# Patient Record
Sex: Male | Born: 1950 | Race: White | Hispanic: No | Marital: Married | Smoking: Never smoker
Health system: Southern US, Community
[De-identification: ages and names within clinical notes are randomized; demographics above are authoritative.]

## PROBLEM LIST (undated history)

## (undated) DIAGNOSIS — K219 Gastro-esophageal reflux disease without esophagitis: Secondary | ICD-10-CM

## (undated) DIAGNOSIS — C4490 Unspecified malignant neoplasm of skin, unspecified: Secondary | ICD-10-CM

## (undated) DIAGNOSIS — M51369 Other intervertebral disc degeneration, lumbar region without mention of lumbar back pain or lower extremity pain: Secondary | ICD-10-CM

## (undated) DIAGNOSIS — H3530 Unspecified macular degeneration: Secondary | ICD-10-CM

## (undated) DIAGNOSIS — N200 Calculus of kidney: Secondary | ICD-10-CM

## (undated) DIAGNOSIS — B360 Pityriasis versicolor: Secondary | ICD-10-CM

## (undated) DIAGNOSIS — M419 Scoliosis, unspecified: Secondary | ICD-10-CM

## (undated) DIAGNOSIS — M5136 Other intervertebral disc degeneration, lumbar region: Secondary | ICD-10-CM

## (undated) DIAGNOSIS — I442 Atrioventricular block, complete: Secondary | ICD-10-CM

## (undated) DIAGNOSIS — I10 Essential (primary) hypertension: Secondary | ICD-10-CM

## (undated) DIAGNOSIS — Z8619 Personal history of other infectious and parasitic diseases: Secondary | ICD-10-CM

## (undated) HISTORY — PX: COLONOSCOPY: SHX174

## (undated) HISTORY — DX: Scoliosis, unspecified: M41.9

## (undated) HISTORY — DX: Essential (primary) hypertension: I10

## (undated) HISTORY — DX: Unspecified macular degeneration: H35.30

## (undated) HISTORY — DX: Other intervertebral disc degeneration, lumbar region: M51.36

## (undated) HISTORY — DX: Other intervertebral disc degeneration, lumbar region without mention of lumbar back pain or lower extremity pain: M51.369

## (undated) HISTORY — DX: Calculus of kidney: N20.0

## (undated) HISTORY — DX: Pityriasis versicolor: B36.0

## (undated) HISTORY — DX: Atrioventricular block, complete: I44.2

## (undated) HISTORY — DX: Gastro-esophageal reflux disease without esophagitis: K21.9

## (undated) HISTORY — DX: Unspecified malignant neoplasm of skin, unspecified: C44.90

## (undated) HISTORY — DX: Personal history of other infectious and parasitic diseases: Z86.19

## (undated) HISTORY — PX: SKIN BIOPSY: SHX1

---

## 1985-07-20 HISTORY — PX: KNEE ARTHROSCOPY WITH ANTERIOR CRUCIATE LIGAMENT (ACL) REPAIR: SHX5644

## 1988-02-20 HISTORY — PX: OTHER SURGICAL HISTORY: SHX169

## 2008-09-19 HISTORY — PX: LITHOTRIPSY: SUR834

## 2011-04-22 DIAGNOSIS — I442 Atrioventricular block, complete: Secondary | ICD-10-CM

## 2011-04-22 HISTORY — DX: Atrioventricular block, complete: I44.2

## 2011-04-22 HISTORY — PX: OTHER SURGICAL HISTORY: SHX169

## 2011-12-16 LAB — ICD DEVICE OBSERVATION

## 2012-04-01 ENCOUNTER — Encounter: Payer: Self-pay | Admitting: Family Medicine

## 2012-04-01 ENCOUNTER — Ambulatory Visit (INDEPENDENT_AMBULATORY_CARE_PROVIDER_SITE_OTHER): Payer: BC Managed Care – PPO | Admitting: Family Medicine

## 2012-04-01 VITALS — BP 142/80 | HR 74 | Temp 98.0°F | Ht 68.5 in | Wt 195.8 lb

## 2012-04-01 DIAGNOSIS — M5136 Other intervertebral disc degeneration, lumbar region: Secondary | ICD-10-CM

## 2012-04-01 DIAGNOSIS — Z950 Presence of cardiac pacemaker: Secondary | ICD-10-CM

## 2012-04-01 DIAGNOSIS — M5137 Other intervertebral disc degeneration, lumbosacral region: Secondary | ICD-10-CM

## 2012-04-01 DIAGNOSIS — B360 Pityriasis versicolor: Secondary | ICD-10-CM

## 2012-04-01 DIAGNOSIS — Z23 Encounter for immunization: Secondary | ICD-10-CM

## 2012-04-01 DIAGNOSIS — Z1211 Encounter for screening for malignant neoplasm of colon: Secondary | ICD-10-CM

## 2012-04-01 DIAGNOSIS — I442 Atrioventricular block, complete: Secondary | ICD-10-CM

## 2012-04-01 MED ORDER — ETODOLAC 400 MG PO TABS: 400.0000 mg | ORAL_TABLET | Freq: Two times a day (BID) | ORAL | Status: DC | PRN

## 2012-04-01 NOTE — Patient Instructions (Signed)
See Shirlee Limerick about your referral before you leave today. Take care.  Glad to see you.   I would like to see you back in summer of 2014.  We'll recheck your labs then.  Check with your insurance to see if they will cover the shingles shot.

## 2012-04-02 ENCOUNTER — Encounter: Payer: Self-pay | Admitting: Family Medicine

## 2012-04-02 DIAGNOSIS — M5136 Other intervertebral disc degeneration, lumbar region: Secondary | ICD-10-CM | POA: Insufficient documentation

## 2012-04-02 DIAGNOSIS — B360 Pityriasis versicolor: Secondary | ICD-10-CM | POA: Insufficient documentation

## 2012-04-02 DIAGNOSIS — Z1211 Encounter for screening for malignant neoplasm of colon: Secondary | ICD-10-CM | POA: Insufficient documentation

## 2012-04-02 DIAGNOSIS — I447 Left bundle-branch block, unspecified: Secondary | ICD-10-CM | POA: Insufficient documentation

## 2012-04-02 NOTE — Assessment & Plan Note (Signed)
Continue topical tx prn.

## 2012-04-02 NOTE — Assessment & Plan Note (Signed)
Continue prn etodolac with GI caution.

## 2012-04-02 NOTE — Assessment & Plan Note (Signed)
Refer to GI 

## 2012-04-02 NOTE — Progress Notes (Signed)
New patient.   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  New to est care.  Requesting records.  No CP, SOB, BLE edema.  Feels well.   Due for colonoscopy.  Referral placed.    H/o DDD in back and prn dosing of etodolac w/o complication.  No ADE.  Takes with food.  + relief with meds.  Prev report re: imaging of back reviewed.  H/o tinea versicolor and treats episodically with topical meds.    Meds, vitals, and allergies reviewed.   ROS: See HPI.  Otherwise, noncontributory.  GEN: nad, alert and oriented HEENT: mucous membranes moist NECK: supple w/o LA CV: rrr.  PULM: ctab, no inc wob ABD: soft, +bs EXT: no edema SKIN: no acute rash

## 2012-04-02 NOTE — Assessment & Plan Note (Signed)
Requesting records.  Feels well.  Refer to cards.

## 2012-04-22 ENCOUNTER — Encounter: Payer: Self-pay | Admitting: Gastroenterology

## 2012-05-04 ENCOUNTER — Encounter: Payer: Self-pay | Admitting: Family Medicine

## 2012-05-04 DIAGNOSIS — Z950 Presence of cardiac pacemaker: Secondary | ICD-10-CM | POA: Insufficient documentation

## 2012-05-17 ENCOUNTER — Encounter: Payer: Self-pay | Admitting: Internal Medicine

## 2012-05-17 ENCOUNTER — Ambulatory Visit (INDEPENDENT_AMBULATORY_CARE_PROVIDER_SITE_OTHER): Payer: BC Managed Care – PPO | Admitting: Internal Medicine

## 2012-05-17 VITALS — BP 138/88 | HR 78 | Ht 68.5 in | Wt 199.0 lb

## 2012-05-17 DIAGNOSIS — I447 Left bundle-branch block, unspecified: Secondary | ICD-10-CM

## 2012-05-17 DIAGNOSIS — I442 Atrioventricular block, complete: Secondary | ICD-10-CM

## 2012-05-17 DIAGNOSIS — Z950 Presence of cardiac pacemaker: Secondary | ICD-10-CM

## 2012-05-17 NOTE — Assessment & Plan Note (Signed)
The patient has left bundle branch block. By description the patient should have had intermittent exercise-induced heart block as the patient's pacemaker is programmed DDD and thus would not improve sinus node dysfunction. It would allow for a synchronous pacing and  pacemaker resolve his symptoms

## 2012-05-17 NOTE — Assessment & Plan Note (Signed)
The patient's device was interrogated.  The information was reviewed. No changes were made in the programming.    

## 2012-05-17 NOTE — Progress Notes (Signed)
ELECTROPHYSIOLOGY CONSULT NOTE  Patient ID: William Curry, MRN: [id number], DOB/AGE: 07/10/1950 62 y.o. Admit date: (Not on file) Date of Consult: 05/17/2012  Primary Physician: Crawford Givens, MD Primary Cardiologist:   Chief Complaint:  Previously implanted pacemaker for complete heart blcok   HPI William Curry is a 62 y.o. male  Seen to establish pacemaker followup for device implanted  for reasons that are not clear. He describes exercise intolerance that was relieved by pacing. He subsequently underwent Myoview scanning which was normal.     Past Medical History  Diagnosis Date  . History of chicken pox   . Hypertension 2002-2005  . Heart block AV complete 2013    Pacemaker  . Kidney stone 1994-2010    Passed stone in 1994, Lithotripsy in 2010  . DDD (degenerative disc disease), lumbar   . Scoliosis   . Tinea versicolor       Surgical History:  Past Surgical History  Procedure Date  . Knee arthroscopy with anterior cruciate ligament (acl) repair 1987    Left knee  . Cartilage repair 1989    Left knee  . Lithotripsy 2010    Kidney stone (1st stone 1994 - Passed)  . Pacemaker installation 2013    Complete Heart Block     Home Meds: Prior to Admission medications   Medication Sig Start Date End Date Taking? Authorizing Provider  etodolac (LODINE) 400 MG tablet Take 1 tablet (400 mg total) by mouth 2 (two) times daily as needed (with food). 04/01/12   Joaquim Nam, MD  ketoconazole (NIZORAL) 200 MG tablet Take 200 mg by mouth daily. Usually only once a year.    Historical Provider, MD  Multiple Vitamin (MULTIVITAMIN) tablet Take 1 tablet by mouth daily.    Historical Provider, MD  terbinafine (LAMISIL) 1 % cream Apply topically 2 (two) times daily.    Historical Provider, MD     Allergies:  Allergies  Allergen Reactions  . Penicillins Other (See Comments)    As a child    History   Social History  . Marital Status: Married    Spouse Name: N/A    Number of  Children: N/A  . Years of Education: N/A   Occupational History  . 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 Smoking status: Never Smoker   . Smokeless tobacco: Never Used  . Alcohol Use: 3.0 oz/week    6 drink(s) per week     Comment: 4 - 6 beers a week  . Drug Use: No  . Sexually Active: Not on file   Other Topics Concern  . Not on file   Social History Narrative   Formerly with [employer], does some accounting workTo  2013Remarried 2007[employer] '71-'75     Family History  Problem Relation Age of Onset  . Cancer Mother 22    Breast  . Diabetes Mother 51    began insulin  . Diabetes Father 49    Began insulin     ROS:  Please see the history of present illness.     All other systems reviewed and negative.    Physical Exam:  There were no vitals taken for this visit. General: Well developed, well nourished male in no acute distress. Head: Normocephalic, atraumatic, sclera non-icteric, no xanthomas, nares are without discharge. Lymph Nodes:  none Back: without scoliosis/kyphosis  , no CVA tendersness Neck: Negative for carotid bruits. JVD not elevated. Lungs: Clear bilaterally  to auscultation without wheezes, rales, or rhonchi. Breathing is unlabored. Heart: RRR with S1 S2. No murmur , rubs, or gallops appreciated. Abdomen: Soft, non-tender, non-distended with normoactive bowel sounds. No hepatomegaly. No rebound/guarding. No obvious abdominal masses. Msk:  Strength and tone appear normal for age. Extremities: No clubbing or cyanosis. No   edema.  Distal pedal pulses are 2+ and equal bilaterally. Skin: Warm and diaphoretic Neuro: Alert and oriented X 3. CN III-XII intact Grossly normal sensory and motor function . Psych:  Responds to questions appropriately with a normal affect.       found.  EKG: Sinus rhythm with left bundle branch block  Assessment and Plan:   Sherryl Manges

## 2012-05-17 NOTE — Patient Instructions (Addendum)
Remote monitoring is used to monitor your Pacemaker of ICD from home. This monitoring reduces the number of office visits required to check your device to one time per year. It allows [place] to keep an eye on the functioning of your device to ensure it is working properly. You are scheduled for a device check from home on August 16, 2012. You may send your transmission at any time that day. If you have a wireless device, the transmission will be sent automatically. After your physician reviews your transmission, you will receive a postcard with your next transmission date.  **WE WILL SHIP YOU CELLULAR ADAPTER FOR MERLIN BOX**  Your physician wants you to follow-up in: 1 year with Dr Graciela Husbands.  You will receive a reminder letter in the mail two months in advance. If you don't receive a letter, please call our office to schedule the follow-up appointment.

## 2012-05-24 ENCOUNTER — Encounter: Payer: Self-pay | Admitting: Gastroenterology

## 2012-05-24 ENCOUNTER — Ambulatory Visit (AMBULATORY_SURGERY_CENTER): Payer: BC Managed Care – PPO | Admitting: *Deleted

## 2012-05-24 VITALS — Ht 69.0 in | Wt 201.4 lb

## 2012-05-24 DIAGNOSIS — Z1211 Encounter for screening for malignant neoplasm of colon: Secondary | ICD-10-CM

## 2012-05-24 MED ORDER — MOVIPREP 100 G PO SOLR: ORAL | Status: DC

## 2012-06-09 ENCOUNTER — Ambulatory Visit (AMBULATORY_SURGERY_CENTER): Payer: BC Managed Care – PPO | Admitting: Gastroenterology

## 2012-06-09 ENCOUNTER — Encounter: Payer: Self-pay | Admitting: Gastroenterology

## 2012-06-09 VITALS — BP 128/78 | HR 62 | Temp 98.1°F | Resp 12 | Ht 69.0 in | Wt 201.0 lb

## 2012-06-09 DIAGNOSIS — Z1211 Encounter for screening for malignant neoplasm of colon: Secondary | ICD-10-CM

## 2012-06-09 MED ORDER — SODIUM CHLORIDE 0.9 % IV SOLN: 500.0000 mL | INTRAVENOUS | Status: DC

## 2012-06-09 NOTE — Patient Instructions (Addendum)
YOU HAD AN ENDOSCOPIC PROCEDURE TODAY AT THE [facility]: Refer to the procedure report that was given to you for any specific questions about what was found during the examination.  If the procedure report does not answer your questions, please call your gastroenterologist to clarify.  If you requested that your care partner not be given the details of your procedure findings, then the procedure report has been included in a sealed envelope for you to review at your convenience later.  YOU SHOULD EXPECT: Some feelings of bloating in the abdomen. Passage of more gas than usual.  Walking can help get rid of the air that was put into your GI tract during the procedure and reduce the bloating. If you had a lower endoscopy (such as a colonoscopy or flexible sigmoidoscopy) you may notice spotting of blood in your stool or on the toilet paper. If you underwent a bowel prep for your procedure, then you may not have a normal bowel movement for a few days.  DIET: Your first meal following the procedure should be a light meal and then it is ok to progress to your normal diet.  A half-sandwich or bowl of soup is an example of a good first meal.  Heavy or fried foods are harder to digest and may make you feel nauseous or bloated.  Likewise meals heavy in dairy and vegetables can cause extra gas to form and this can also increase the bloating.  Drink plenty of fluids but you should avoid alcoholic beverages for 24 hours.  ACTIVITY: Your care partner should take you home directly after the procedure.  You should plan to take it easy, moving slowly for the rest of the day.  You can resume normal activity the day after the procedure however you should NOT DRIVE or use heavy machinery for 24 hours (because of the sedation medicines used during the test).    SYMPTOMS TO REPORT IMMEDIATELY: A gastroenterologist can be reached at any hour.  During normal business hours, 8:30 AM to 5:00 PM Monday through Friday,  call [phone].  After hours and on weekends, please call the GI answering service at [phone] who will take a message and have the physician on call contact you.   Following lower endoscopy (colonoscopy or flexible sigmoidoscopy):  Excessive amounts of blood in the stool  Significant tenderness or worsening of abdominal pains  Swelling of the abdomen that is new, acute  Fever of 100F or higher  FOLLOW UP: If any biopsies were taken you will be contacted by phone or by letter within the next 1-3 weeks.  Call your gastroenterologist if you have not heard about the biopsies in 3 weeks.  Our staff will call the home number listed on your records the next business day following your procedure to check on you and address any questions or concerns that you may have at that time regarding the information given to you following your procedure. This is a courtesy call and so if there is no answer at the home number and we have not heard from you through the emergency physician on call, we will assume that you have returned to your regular daily activities without incident.  SIGNATURES/CONFIDENTIALITY: You and/or your care partner have signed paperwork which will be entered into your electronic medical record.  These signatures attest to the fact that that the information above on your After Visit Summary has been reviewed and is understood.  Full responsibility of the confidentiality of this   discharge information lies with you and/or your care-partner.  Repeat colonoscopy in 10 years.  

## 2012-06-09 NOTE — Op Note (Signed)
[facility] [address] [place], [address]   COLONOSCOPY PROCEDURE REPORT  PATIENT: William Curry, William Curry  MR#: [id number] BIRTHDATE: 07/17/1950 , 61  yrs. old GENDER: Male ENDOSCOPIST: Mardella Layman, MD, [facility] REFERRED BY:  Crawford Givens, M.D. PROCEDURE DATE:  06/09/2012 PROCEDURE:   Colonoscopy, screening ASA CLASS:   Class III INDICATIONS:Average risk patient for colon cancer. MEDICATIONS: propofol (Diprivan) 300mg  IV  DESCRIPTION OF PROCEDURE:   After the risks and benefits and of the procedure were explained, informed consent was obtained.  A digital rectal exam revealed no abnormalities of the rectum.    The LB [id number]  endoscope was introduced through the anus and advanced to the cecum, which was identified by both the appendix and ileocecal valve .  The quality of the prep was excellent, using MoviPrep .  The instrument was then slowly withdrawn as the colon was fully examined.     COLON FINDINGS: A normal appearing cecum, ileocecal valve, and appendiceal orifice were identified.  The ascending, hepatic flexure, transverse, splenic flexure, descending, sigmoid colon and rectum appeared unremarkable.  No polyps or cancers were seen. Retroflexed views revealed no abnormalities.     The scope was then withdrawn from the patient and the procedure completed.  COMPLICATIONS: There were no complications. ENDOSCOPIC IMPRESSION: Normal colon ...no polyps noted...  RECOMMENDATIONS: 1.  Continue current colorectal screening recommendations for "routine risk" patients with a repeat colonoscopy in 10 years. 2.  Continue current medications   REPEAT EXAM:  cc:  _______________________________ eSignedMardella Layman, MD, [facility] 06/09/2012 10:28 AM

## 2012-06-09 NOTE — Progress Notes (Signed)
Patient did not experience any of the following events: a burn prior to discharge; a fall within the facility; wrong site/side/patient/procedure/implant event; or a hospital transfer or hospital admission upon discharge from the facility. (G8907) Patient did not have preoperative order for IV antibiotic SSI prophylaxis. (G8918)  

## 2012-06-10 ENCOUNTER — Telehealth: Payer: Self-pay

## 2012-06-10 NOTE — Telephone Encounter (Signed)
  Follow up Call-  Call back number 06/09/2012  Post procedure Call Back phone  # [phone] cell  Permission to leave phone message Yes     Patient questions:  Do you have a fever, pain , or abdominal swelling? no Pain Score  0 *  Have you tolerated food without any problems? yes  Have you been able to return to your normal activities? yes  Do you have any questions about your discharge instructions: Diet   no Medications  no Follow up visit  no  Do you have questions or concerns about your Care? no  Actions: * If pain score is 4 or above: No action needed, pain <4.

## 2012-07-05 ENCOUNTER — Encounter: Payer: Self-pay | Admitting: Family Medicine

## 2012-07-05 ENCOUNTER — Ambulatory Visit (INDEPENDENT_AMBULATORY_CARE_PROVIDER_SITE_OTHER): Payer: BC Managed Care – PPO | Admitting: Family Medicine

## 2012-07-05 VITALS — BP 118/84 | HR 79 | Temp 98.1°F

## 2012-07-05 DIAGNOSIS — M5136 Other intervertebral disc degeneration, lumbar region: Secondary | ICD-10-CM

## 2012-07-05 DIAGNOSIS — M5137 Other intervertebral disc degeneration, lumbosacral region: Secondary | ICD-10-CM

## 2012-07-05 MED ORDER — HYDROCODONE-ACETAMINOPHEN 5-325 MG PO TABS: 1.0000 | ORAL_TABLET | Freq: Four times a day (QID) | ORAL | Status: DC | PRN

## 2012-07-05 NOTE — Patient Instructions (Signed)
Keep using the etodolac.  Use the hydrocodone for pain, keep stretching and using your weight belt as needed with activity.  If not improved, call back and we can set you up with the back clinic.  Take care.

## 2012-07-05 NOTE — Progress Notes (Signed)
Known L2/L3 DDD.  Dextroscoliosis.  His xrays from 2010 are reviewed today in office.   R sided lower back pain, pinching feeling.  H/o mult episodes prev, but this one is longer than prev.  More pain in his lower back with chin to chest, and this is new.  No radicular sx.  No weakness. No trigger for this episode.  No fevers.  No dysuria or saddle anesthesia.  He had a few norco left over and used those on top of the etodolac.  It's been going on a week now w/o sig improvement.    He's fallen off with stretching.  S/p PT prev, w/o sig improvement.   Meds, vitals, and allergies reviewed.   ROS: See HPI.  Otherwise, noncontributory.  nad rrr ctab Back w/o midline pain ttp in R lower back in lateral paraspinal muscles R SLR neg but hamstring tight Distally NV intact with normal DTRs No pain on R hip int/ext rotation

## 2012-07-05 NOTE — Assessment & Plan Note (Signed)
Old xrays reviewed and given back to patient.  Keep using etodolac. Use hydrocodone for pain, keep stretching and using weight belt as needed with activity. If not improved, he'll call back and we can set up spine clinic referral. No alarming findings on exam.

## 2012-08-16 ENCOUNTER — Encounter: Payer: BC Managed Care – PPO | Admitting: *Deleted

## 2012-12-02 ENCOUNTER — Encounter: Payer: Self-pay | Admitting: Family Medicine

## 2012-12-02 ENCOUNTER — Ambulatory Visit (INDEPENDENT_AMBULATORY_CARE_PROVIDER_SITE_OTHER): Payer: BC Managed Care – PPO | Admitting: Family Medicine

## 2012-12-02 VITALS — BP 130/78 | HR 67 | Temp 98.1°F | Wt 193.0 lb

## 2012-12-02 DIAGNOSIS — H00013 Hordeolum externum right eye, unspecified eyelid: Secondary | ICD-10-CM

## 2012-12-02 DIAGNOSIS — B360 Pityriasis versicolor: Secondary | ICD-10-CM

## 2012-12-02 DIAGNOSIS — H00019 Hordeolum externum unspecified eye, unspecified eyelid: Secondary | ICD-10-CM

## 2012-12-02 MED ORDER — KETOCONAZOLE 200 MG PO TABS: 200.0000 mg | ORAL_TABLET | Freq: Every day | ORAL | Status: DC

## 2012-12-02 NOTE — Patient Instructions (Addendum)
Use the nizoral for now.  Use warm compresses on your right eye and this should get better.  It looks like a stye. Take care.

## 2012-12-02 NOTE — Progress Notes (Signed)
Monday evening, inferior R eyelid puffy and red.  No sig change in the meantime.  No vision changes.  Eyes feel a little itchy "but that may be because I'm thinking about it."  Minimally ttp on the lower lid. No sick contacts.  No discharge.  He had a head cold but this had resolved and that was 1 month ago.  No interventions for the eye lid.    Needs refill on nizoral. H/o tinea versicolor and returned recently.    Meds, vitals, and allergies reviewed.   ROS: See HPI.  Otherwise, noncontributory.  nad ncat except for inferior R eyelid puffy and red, in the middle of the eyelid.   PERRL, EOMI Fundus wnl Conjunctiva and other lids wnl No discharge, no FB Typical tinea versicolor changes on the chest.

## 2012-12-03 DIAGNOSIS — H00019 Hordeolum externum unspecified eye, unspecified eyelid: Secondary | ICD-10-CM | POA: Insufficient documentation

## 2012-12-03 NOTE — Assessment & Plan Note (Signed)
D/w pt.  Warm compresses, should resolve.  Fu prn.

## 2012-12-03 NOTE — Assessment & Plan Note (Signed)
Refill nizoral, use prn.

## 2013-02-24 ENCOUNTER — Other Ambulatory Visit: Payer: Self-pay

## 2013-02-28 ENCOUNTER — Ambulatory Visit (INDEPENDENT_AMBULATORY_CARE_PROVIDER_SITE_OTHER): Payer: Commercial Indemnity | Admitting: Family Medicine

## 2013-02-28 ENCOUNTER — Encounter: Payer: Self-pay | Admitting: Family Medicine

## 2013-02-28 VITALS — BP 142/80 | HR 75 | Temp 98.1°F | Wt 197.8 lb

## 2013-02-28 DIAGNOSIS — M7650 Patellar tendinitis, unspecified knee: Secondary | ICD-10-CM

## 2013-02-28 DIAGNOSIS — Z13220 Encounter for screening for lipoid disorders: Secondary | ICD-10-CM

## 2013-02-28 DIAGNOSIS — Z131 Encounter for screening for diabetes mellitus: Secondary | ICD-10-CM

## 2013-02-28 DIAGNOSIS — M7090 Unspecified soft tissue disorder related to use, overuse and pressure of unspecified site: Secondary | ICD-10-CM

## 2013-02-28 DIAGNOSIS — M79609 Pain in unspecified limb: Secondary | ICD-10-CM

## 2013-02-28 DIAGNOSIS — Z23 Encounter for immunization: Secondary | ICD-10-CM

## 2013-02-28 MED ORDER — ETODOLAC 400 MG PO TABS: 400.0000 mg | ORAL_TABLET | Freq: Two times a day (BID) | ORAL | Status: DC | PRN

## 2013-02-28 NOTE — Patient Instructions (Signed)
Rest your left arm in the meantime- this looks like a biceps strain.  Get a jumper's knee strap for your knee and that should help.  If not improving, then let me know.  Take care.

## 2013-02-28 NOTE — Progress Notes (Signed)
Pre-visit discussion using our clinic review tool. No additional management support is needed unless otherwise documented below in the visit note.  DDD in lower back, improved with etodolac, needs refill.  No ADE.  Rare hydrocodone use.  Taking about 1-2 times a month.    Needs a flu shot.  Waist 40".  Needs routine labs for health screening.   L upper arm pain, started after going back to the gym this summer. Some days better than others.  No trauma.  Some pain with lifting.  He thought he strained it on the butterfly machine.  He has tried to rest it w/o full relief.    B knee pain.  H/o L ACL tear, s/p scope in 1987.  He didn't have knee buckling after the surgery.  Then tore the L knee meniscus, was scoped again.   B knee pain if on a treadmill or bike too many days in a row.  No pain walking until mid September.  Now with more L anterior/inferior pain.  That is the most bothersome to him.    Meds, vitals, and allergies reviewed.   ROS: See HPI.  Otherwise, noncontributory.  nad ncat Neck supple, not ttp, normal ROM L shoulder with normal ROM and no cuff sx but proximal biceps tender on testing, no rash L knee with normal inspection, ACL MCL LCL feel solid.  No meniscal click.  He points to the quad tendon at the painful area, but it isn't tender today even with 1 leg squats.

## 2013-03-01 DIAGNOSIS — M7650 Patellar tendinitis, unspecified knee: Secondary | ICD-10-CM | POA: Insufficient documentation

## 2013-03-01 DIAGNOSIS — M79603 Pain in arm, unspecified: Secondary | ICD-10-CM | POA: Insufficient documentation

## 2013-03-01 NOTE — Assessment & Plan Note (Signed)
With no sig sx today.  Would use a knee strap prn and fu prn.  Anatomy dw pt.  He agrees.

## 2013-03-01 NOTE — Assessment & Plan Note (Signed)
Likely biceps strain, would rest for now except for unweighted ROM exercises and if not improved then we can refer to PT. Anatomy d/w pt.

## 2013-03-08 ENCOUNTER — Other Ambulatory Visit (INDEPENDENT_AMBULATORY_CARE_PROVIDER_SITE_OTHER): Payer: BC Managed Care – PPO

## 2013-03-08 DIAGNOSIS — Z131 Encounter for screening for diabetes mellitus: Secondary | ICD-10-CM

## 2013-03-08 DIAGNOSIS — Z13220 Encounter for screening for lipoid disorders: Secondary | ICD-10-CM

## 2013-03-08 LAB — LIPID PANEL
Cholesterol: 130 mg/dL (ref 0–200)
LDL Cholesterol: 86 mg/dL (ref 0–99)
Triglycerides: 45 mg/dL (ref 0.0–149.0)
VLDL: 9 mg/dL (ref 0.0–40.0)

## 2013-03-08 LAB — GLUCOSE, RANDOM: Glucose, Bld: 99 mg/dL (ref 70–99)

## 2013-06-13 ENCOUNTER — Encounter: Payer: Self-pay | Admitting: Internal Medicine

## 2013-06-13 ENCOUNTER — Ambulatory Visit (INDEPENDENT_AMBULATORY_CARE_PROVIDER_SITE_OTHER): Payer: Commercial Indemnity | Admitting: Internal Medicine

## 2013-06-13 VITALS — BP 155/98 | HR 74 | Ht 68.5 in | Wt 202.0 lb

## 2013-06-13 DIAGNOSIS — I442 Atrioventricular block, complete: Secondary | ICD-10-CM

## 2013-06-13 DIAGNOSIS — R030 Elevated blood-pressure reading, without diagnosis of hypertension: Secondary | ICD-10-CM

## 2013-06-13 DIAGNOSIS — Z950 Presence of cardiac pacemaker: Secondary | ICD-10-CM

## 2013-06-13 DIAGNOSIS — I447 Left bundle-branch block, unspecified: Secondary | ICD-10-CM

## 2013-06-13 DIAGNOSIS — IMO0001 Reserved for inherently not codable concepts without codable children: Secondary | ICD-10-CM | POA: Insufficient documentation

## 2013-06-13 LAB — MDC_IDC_ENUM_SESS_TYPE_INCLINIC
Battery Voltage: 2.98 V
Brady Statistic RA Percent Paced: 8.8 %
Brady Statistic RV Percent Paced: 0.29 %
Date Time Interrogation Session: 20150223121922
Implantable Pulse Generator Serial Number: 7304172
Lead Channel Impedance Value: 350 Ohm
Lead Channel Impedance Value: 475 Ohm
Lead Channel Pacing Threshold Amplitude: 0.5 V
Lead Channel Pacing Threshold Amplitude: 1.375 V
Lead Channel Pacing Threshold Pulse Width: 0.4 ms
Lead Channel Setting Pacing Amplitude: 1.625
MDC IDC MSMT BATTERY REMAINING LONGEVITY: 121.2 mo
MDC IDC MSMT LEADCHNL RA SENSING INTR AMPL: 4.5 mV
MDC IDC MSMT LEADCHNL RV PACING THRESHOLD PULSEWIDTH: 0.4 ms
MDC IDC MSMT LEADCHNL RV SENSING INTR AMPL: 7.4 mV
MDC IDC SET LEADCHNL RA PACING AMPLITUDE: 1.5 V
MDC IDC SET LEADCHNL RV PACING PULSEWIDTH: 0.4 ms
MDC IDC SET LEADCHNL RV SENSING SENSITIVITY: 0.5 mV

## 2013-06-13 NOTE — Assessment & Plan Note (Signed)
This is been a problem in the past; we'll continue to follow it. He is to take his blood pressures at home and follow up with his PCP

## 2013-06-13 NOTE — Progress Notes (Signed)
      Patient Care Team: Tonia Ghent, MD as PCP - General (Family Medicine)   HPI  William Curry is a 63 y.o. male Seen in followup for previously implanted pacemaker undertaken for complete heart block-intermittent  No exercise intolerance  Past Medical History  Diagnosis Date  . History of chicken pox   . Hypertension 2002-2005  . Heart block AV complete 2013    Pacemaker  . Kidney stone 1994-2010    Passed stone in 1994, Lithotripsy in 2010  . DDD (degenerative disc disease), lumbar   . Scoliosis   . Tinea versicolor     Past Surgical History  Procedure Laterality Date  . Knee arthroscopy with anterior cruciate ligament (acl) repair  April-1987    Left knee-HARD TO WAKE UP AFTER SX  . Cartilage repair  Nov.-1989    Left knee-HARD TO WAKE UP AFTER SX  . Lithotripsy  Jun-2010    Kidney stone (1st stone 1994 - Passed)  . Pacemaker installation  Jan-2013    Complete Heart Block    Current Outpatient Prescriptions  Medication Sig Dispense Refill  . etodolac (LODINE) 400 MG tablet Take 1 tablet (400 mg total) by mouth 2 (two) times daily as needed (with food).  60 tablet  12  . Multiple Vitamin (MULTIVITAMIN) tablet Take 1 tablet by mouth daily.      Marland Kitchen terbinafine (LAMISIL) 1 % cream Apply topically 2 (two) times daily as needed.        No current facility-administered medications for this visit.    Allergies  Allergen Reactions  . Penicillins Other (See Comments)    As a child    Review of Systems negative except from HPI and PMH  Physical Exam BP 155/98  Pulse 74  Ht 5' 8.5" (1.74 m)  Wt 202 lb (91.627 kg)  BMI 30.26 kg/m2 Well developed and nourished in no acute distress HENT normal Neck supple with JVP-flat Clear Device pocket well healed; without hematoma or erythema.  There is no tethering Regular rate and rhythm, no murmurs or gallops Abd-soft with active BS No Clubbing cyanosis edema Skin-warm and dry A & Oriented  Grossly normal sensory  and motor function  ECG demonstrates sinus rhythm with left bundle branch block and left axis deviation   Assessment and  Plan

## 2013-06-13 NOTE — Patient Instructions (Signed)

## 2013-06-13 NOTE — Assessment & Plan Note (Signed)
stable °

## 2013-06-17 ENCOUNTER — Telehealth: Payer: Self-pay | Admitting: Internal Medicine

## 2013-06-17 NOTE — Telephone Encounter (Signed)
New message  Pt called// has a remote check in may// pt will be out of town// would like to resch// Please call if this is possible//SR

## 2013-09-19 ENCOUNTER — Encounter: Payer: Self-pay | Admitting: Internal Medicine

## 2013-09-19 ENCOUNTER — Other Ambulatory Visit: Payer: Self-pay | Admitting: Cardiology

## 2013-09-19 DIAGNOSIS — I442 Atrioventricular block, complete: Secondary | ICD-10-CM

## 2013-09-21 ENCOUNTER — Ambulatory Visit (INDEPENDENT_AMBULATORY_CARE_PROVIDER_SITE_OTHER): Payer: Commercial Indemnity | Admitting: *Deleted

## 2013-09-21 DIAGNOSIS — I442 Atrioventricular block, complete: Secondary | ICD-10-CM

## 2013-09-21 NOTE — Progress Notes (Signed)
Remote pacemaker transmission.   

## 2013-09-22 LAB — MDC_IDC_ENUM_SESS_TYPE_REMOTE
Battery Remaining Longevity: 106 mo
Battery Remaining Percentage: 80 %
Brady Statistic AS VS Percent: 90 %
Brady Statistic RA Percent Paced: 8.4 %
Date Time Interrogation Session: 20150601061039
Implantable Pulse Generator Model: 2210
Lead Channel Impedance Value: 450 Ohm
Lead Channel Pacing Threshold Amplitude: 1.625 V
Lead Channel Pacing Threshold Pulse Width: 0.4 ms
Lead Channel Setting Pacing Amplitude: 1.5 V
Lead Channel Setting Pacing Pulse Width: 0.4 ms
MDC IDC MSMT BATTERY VOLTAGE: 2.96 V
MDC IDC MSMT LEADCHNL RA PACING THRESHOLD AMPLITUDE: 0.5 V
MDC IDC MSMT LEADCHNL RA PACING THRESHOLD PULSEWIDTH: 0.4 ms
MDC IDC MSMT LEADCHNL RA SENSING INTR AMPL: 4.9 mV
MDC IDC MSMT LEADCHNL RV IMPEDANCE VALUE: 390 Ohm
MDC IDC MSMT LEADCHNL RV SENSING INTR AMPL: 6.4 mV
MDC IDC PG SERIAL: 7304172
MDC IDC SET LEADCHNL RV PACING AMPLITUDE: 1.875
MDC IDC SET LEADCHNL RV SENSING SENSITIVITY: 0.5 mV
MDC IDC STAT BRADY AP VP PERCENT: 1 %
MDC IDC STAT BRADY AP VS PERCENT: 8.6 %
MDC IDC STAT BRADY AS VP PERCENT: 1.1 %
MDC IDC STAT BRADY RV PERCENT PACED: 1.2 %

## 2013-09-30 ENCOUNTER — Ambulatory Visit (INDEPENDENT_AMBULATORY_CARE_PROVIDER_SITE_OTHER): Payer: Commercial Indemnity | Admitting: Family Medicine

## 2013-09-30 ENCOUNTER — Encounter: Payer: Self-pay | Admitting: Cardiology

## 2013-09-30 ENCOUNTER — Encounter: Payer: Self-pay | Admitting: Family Medicine

## 2013-09-30 VITALS — BP 122/82 | HR 92 | Temp 98.5°F | Wt 198.0 lb

## 2013-09-30 DIAGNOSIS — J069 Acute upper respiratory infection, unspecified: Secondary | ICD-10-CM

## 2013-09-30 MED ORDER — FLUTICASONE PROPIONATE 50 MCG/ACT NA SUSP: 2.0000 | Freq: Every day | NASAL | Status: DC

## 2013-09-30 MED ORDER — BENZONATATE 200 MG PO CAPS: 200.0000 mg | ORAL_CAPSULE | Freq: Three times a day (TID) | ORAL | Status: DC | PRN

## 2013-09-30 MED ORDER — RANITIDINE HCL 75 MG PO TABS: 75.0000 mg | ORAL_TABLET | Freq: Every day | ORAL | Status: DC

## 2013-09-30 NOTE — Patient Instructions (Addendum)
Acute illness- likely viral.  Would give this more time.  Lungs are clear.  Likely with nasal irritation/eustachian tube congestion causing the ear symptoms.   Use flonase for the nasal symptoms.  Tesslon for cough.  Drink plenty of fluids and try to get some rest.  Try taking zantac daily to help the chronic cough.  Take care.

## 2013-09-30 NOTE — Progress Notes (Signed)
Pre visit review using our clinic review tool, if applicable. No additional management support is needed unless otherwise documented below in the visit note.  Wife was sick and then he got sick a few days ago.  Cough-dry generally, ears popping, sense of smell is altered.  He took some OTC pseudophed and robitussin.  No fevers.  No vomiting.  No ear pain but they feel stuffy.  Scratchy throat.  Some post nasal gtt.  Taste is altered, recently.  No facial pain, no tooth pain.   He has a h/o GERD cough that improved on prilosec prev.  He got off the medicine and then it gradually came back.  He takes zantac rarely.    Meds, vitals, and allergies reviewed.   ROS: See HPI.  Otherwise, noncontributory.  GEN: nad, alert and oriented HEENT: mucous membranes moist, tm w/o erythema but R>L SOM, nasal exam w/o erythema, clear discharge noted,  OP with cobblestoning, sinuses not ttp NECK: supple w/o LA CV: rrr.   PULM: ctab, no inc wob EXT: no edema SKIN: no acute rash

## 2013-10-02 DIAGNOSIS — J069 Acute upper respiratory infection, unspecified: Secondary | ICD-10-CM | POA: Insufficient documentation

## 2013-10-02 NOTE — Assessment & Plan Note (Signed)
Acute illness- likely viral. Would give this more time. Lungs are clear. Likely with nasal irritation/eustachian tube congestion causing the ear symptoms.  D/w pt.   Use flonase for the nasal symptoms. Tesslon for cough. Drink plenty of fluids and try to get some rest.  Try taking zantac daily to help the chronic cough that is likely from GERD.  F/u prn.  He agrees.

## 2013-12-22 ENCOUNTER — Ambulatory Visit (INDEPENDENT_AMBULATORY_CARE_PROVIDER_SITE_OTHER): Payer: Commercial Indemnity | Admitting: *Deleted

## 2013-12-22 DIAGNOSIS — I442 Atrioventricular block, complete: Secondary | ICD-10-CM

## 2013-12-22 NOTE — Progress Notes (Signed)
Remote pacemaker transmission.   

## 2013-12-30 LAB — MDC_IDC_ENUM_SESS_TYPE_REMOTE
Battery Remaining Longevity: 103 mo
Brady Statistic AP VP Percent: 1 %
Brady Statistic AP VS Percent: 9.6 %
Brady Statistic AS VP Percent: 1.1 %
Brady Statistic AS VS Percent: 89 %
Brady Statistic RA Percent Paced: 9.4 %
Brady Statistic RV Percent Paced: 1.1 %
Lead Channel Impedance Value: 350 Ohm
Lead Channel Pacing Threshold Amplitude: 0.5 V
Lead Channel Pacing Threshold Amplitude: 1.625 V
Lead Channel Pacing Threshold Pulse Width: 0.4 ms
Lead Channel Pacing Threshold Pulse Width: 0.4 ms
Lead Channel Sensing Intrinsic Amplitude: 6.4 mV
Lead Channel Setting Pacing Amplitude: 1.5 V
Lead Channel Setting Pacing Amplitude: 1.875
Lead Channel Setting Sensing Sensitivity: 0.5 mV
MDC IDC MSMT BATTERY REMAINING PERCENTAGE: 79 %
MDC IDC MSMT BATTERY VOLTAGE: 2.96 V
MDC IDC MSMT LEADCHNL RA IMPEDANCE VALUE: 530 Ohm
MDC IDC MSMT LEADCHNL RA SENSING INTR AMPL: 3.6 mV
MDC IDC PG SERIAL: 7304172
MDC IDC SESS DTM: 20150903064502
MDC IDC SET LEADCHNL RV PACING PULSEWIDTH: 0.4 ms

## 2014-01-10 ENCOUNTER — Encounter: Payer: Self-pay | Admitting: Cardiology

## 2014-01-11 ENCOUNTER — Encounter: Payer: Self-pay | Admitting: Internal Medicine

## 2014-01-25 ENCOUNTER — Ambulatory Visit (INDEPENDENT_AMBULATORY_CARE_PROVIDER_SITE_OTHER): Payer: Commercial Indemnity

## 2014-01-25 DIAGNOSIS — Z23 Encounter for immunization: Secondary | ICD-10-CM

## 2014-04-16 ENCOUNTER — Other Ambulatory Visit: Payer: Self-pay | Admitting: Family Medicine

## 2014-04-16 DIAGNOSIS — I447 Left bundle-branch block, unspecified: Secondary | ICD-10-CM

## 2014-05-09 ENCOUNTER — Other Ambulatory Visit (INDEPENDENT_AMBULATORY_CARE_PROVIDER_SITE_OTHER): Payer: Commercial Indemnity

## 2014-05-09 DIAGNOSIS — I447 Left bundle-branch block, unspecified: Secondary | ICD-10-CM

## 2014-05-09 LAB — BASIC METABOLIC PANEL WITH GFR
BUN: 22 mg/dL (ref 6–23)
CO2: 27 meq/L (ref 19–32)
Calcium: 9.5 mg/dL (ref 8.4–10.5)
Chloride: 105 meq/L (ref 96–112)
Creatinine, Ser: 1.21 mg/dL (ref 0.40–1.50)
GFR: 64.3 mL/min (ref >60.00)
Glucose, Bld: 108 mg/dL — ABNORMAL HIGH (ref 70–99)
Potassium: 4.9 meq/L (ref 3.5–5.1)
Sodium: 140 meq/L (ref 135–145)

## 2014-05-16 ENCOUNTER — Encounter: Payer: Self-pay | Admitting: Family Medicine

## 2014-05-16 ENCOUNTER — Ambulatory Visit (INDEPENDENT_AMBULATORY_CARE_PROVIDER_SITE_OTHER): Payer: Commercial Indemnity | Admitting: Family Medicine

## 2014-05-16 VITALS — BP 120/72 | HR 85 | Temp 98.6°F | Ht 69.0 in | Wt 206.2 lb

## 2014-05-16 DIAGNOSIS — Z23 Encounter for immunization: Secondary | ICD-10-CM

## 2014-05-16 DIAGNOSIS — Z0000 Encounter for general adult medical examination without abnormal findings: Secondary | ICD-10-CM

## 2014-05-16 DIAGNOSIS — Z7189 Other specified counseling: Secondary | ICD-10-CM

## 2014-05-16 DIAGNOSIS — M7650 Patellar tendinitis, unspecified knee: Secondary | ICD-10-CM

## 2014-05-16 DIAGNOSIS — K219 Gastro-esophageal reflux disease without esophagitis: Secondary | ICD-10-CM

## 2014-05-16 MED ORDER — OMEPRAZOLE MAGNESIUM 20 MG PO TBEC: 20.0000 mg | DELAYED_RELEASE_TABLET | Freq: Every day | ORAL | Status: DC

## 2014-05-16 NOTE — Progress Notes (Signed)
Pre visit review using our clinic review tool, if applicable. No additional management support is needed unless otherwise documented below in the visit note.  CPE- See plan.  Routine anticipatory guidance given to patient.  See health maintenance. Tetanus today Flu 2015 PNA due at 65 Shingles d/w pt.   Colon 2014 per GI.  Prostate cancer screening and PSA options (with potential risks and benefits of testing vs not testing) were discussed along with recent recs/guidelines.  He declined testing PSA at this point. Living will d/w pt.  Wife designated if patient were incapacitated.   Diet and exercise d/w pt. Going to gym QOD.   Mild inc in sugar.  D/w pt.   He was going to check on getting a hearing screen done at [facility].  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    Nocturia x1 at night.  Takes advil PM to go back to sleep, 1/2 dose, 3-4x per week.    Nsaid for back pain and knee pain.  Still with knee pain in spite of jumper's knee strap.  Back is stiff in AM, better with "loosening up" as the day goes on.  He wanted to defer ortho eval at this point and continue modification of exercise routine.   Cough started years ago, then he did well for a period of years.  Starting ~ 5 years ago, he started to need intermittent H2 blocker.   More GERD sx and more cough noted recently, over the last few months.  On nsaid daily.  Taking zantac daily now, and cough continues.  He can feel a burning in his throat from reflux.  Occ clear sputum. No red flag sx, ie unintended weight loss or bloody sputum.    PMH and SH reviewed  Meds, vitals, and allergies reviewed.   ROS: See HPI.  Otherwise negative.    GEN: nad, alert and oriented HEENT: mucous membranes moist NECK: supple w/o LA CV: rrr. PULM: ctab, no inc wob ABD: soft, +bs EXT: no edema SKIN: no acute rash

## 2014-05-16 NOTE — Patient Instructions (Addendum)
Check with your insurance to see if they will cover the shingles shot. Stop zantac, change to prilosec and notify me if the cough isn't better.   Take care.  Glad to see you.

## 2014-05-17 ENCOUNTER — Encounter: Payer: Self-pay | Admitting: Family Medicine

## 2014-05-17 DIAGNOSIS — Z7189 Other specified counseling: Secondary | ICD-10-CM | POA: Insufficient documentation

## 2014-05-17 DIAGNOSIS — Z5181 Encounter for therapeutic drug level monitoring: Secondary | ICD-10-CM | POA: Insufficient documentation

## 2014-05-17 DIAGNOSIS — Z0000 Encounter for general adult medical examination without abnormal findings: Secondary | ICD-10-CM | POA: Insufficient documentation

## 2014-05-17 DIAGNOSIS — K219 Gastro-esophageal reflux disease without esophagitis: Secondary | ICD-10-CM | POA: Insufficient documentation

## 2014-05-17 NOTE — Assessment & Plan Note (Addendum)
Routine anticipatory guidance given to patient.  See health maintenance. Tetanus 2015 Flu 2015 PNA due at 65 Shingles d/w pt.   Colon 2014 per GI.  Prostate cancer screening and PSA options (with potential risks and benefits of testing vs not testing) were discussed along with recent recs/guidelines.  He declined testing PSA at this point. Living will d/w pt.  Wife designated if patient were incapacitated.   Diet and exercise d/w pt. Going to gym QOD.   Mild inc in sugar.  D/w pt re: diet and exercise.  He was going to check on getting a hearing screen done at [facility].  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

## 2014-05-17 NOTE — Assessment & Plan Note (Signed)
Continue nsaid and activity modification for now.  He'll notify me if/when he wants to go to ortho.

## 2014-05-17 NOTE — Assessment & Plan Note (Signed)
Likely causing the cough.  Some benefit for his joint pain from nsaid, but likely contributing to gerd. Would try OTC prilosec for now.  If improved, we can make plans about nsaid use.  If not better, then we can w/u further.  He agrees.

## 2014-06-19 ENCOUNTER — Encounter: Payer: Self-pay | Admitting: Internal Medicine

## 2014-06-19 ENCOUNTER — Ambulatory Visit (INDEPENDENT_AMBULATORY_CARE_PROVIDER_SITE_OTHER): Payer: Commercial Indemnity | Admitting: Internal Medicine

## 2014-06-19 VITALS — BP 110/84 | HR 83 | Ht 68.0 in | Wt 205.4 lb

## 2014-06-19 DIAGNOSIS — Z950 Presence of cardiac pacemaker: Secondary | ICD-10-CM

## 2014-06-19 DIAGNOSIS — Z45018 Encounter for adjustment and management of other part of cardiac pacemaker: Secondary | ICD-10-CM

## 2014-06-19 DIAGNOSIS — I442 Atrioventricular block, complete: Secondary | ICD-10-CM | POA: Diagnosis not present

## 2014-06-19 LAB — MDC_IDC_ENUM_SESS_TYPE_INCLINIC
Battery Voltage: 2.96 V
Brady Statistic RA Percent Paced: 8.4 %
Implantable Pulse Generator Serial Number: 7304172
Lead Channel Impedance Value: 400 Ohm
Lead Channel Impedance Value: 550 Ohm
Lead Channel Pacing Threshold Amplitude: 1.5 V
Lead Channel Pacing Threshold Pulse Width: 0.4 ms
Lead Channel Sensing Intrinsic Amplitude: 5 mV
Lead Channel Setting Pacing Amplitude: 1.5 V
Lead Channel Setting Pacing Pulse Width: 0.4 ms
Lead Channel Setting Sensing Sensitivity: 0.5 mV
MDC IDC MSMT BATTERY REMAINING LONGEVITY: 132 mo
MDC IDC MSMT LEADCHNL RA PACING THRESHOLD AMPLITUDE: 0.5 V
MDC IDC MSMT LEADCHNL RA PACING THRESHOLD PULSEWIDTH: 0.4 ms
MDC IDC MSMT LEADCHNL RV SENSING INTR AMPL: 2.3 mV
MDC IDC SESS DTM: 20160229110722
MDC IDC SET LEADCHNL RV PACING AMPLITUDE: 1.75 V
MDC IDC STAT BRADY RV PERCENT PACED: 16 %

## 2014-06-19 NOTE — Patient Instructions (Signed)

## 2014-06-19 NOTE — Progress Notes (Signed)
Electrophysiology Office Note   Date:  06/19/2014   ID:  Ciel Yanes, DOB 12-13-1950, MRN [id number]  PCP:  Elsie Stain, MD  Cardiologist:   Primary Electrophysiologist:   Virl Axe, MD    No chief complaint on file.    History of Present Illness: William Curry is a 64 y.o. male i  Seen in followup for previously implanted pacemaker undertaken for complete heart block-intermittent   Today, he denies  symptoms of palpitations, chest pain, shortness of breath, orthopnea, PND, lower extremity edema, claudication, dizziness, presyncope, syncope, bleeding, or neurologic sequela. The patient is tolerating medications without difficulties and is otherwise without complaint today.    Past Medical History  Diagnosis Date  . History of chicken pox   . Hypertension 2002-2005  . Heart block AV complete 2013    Pacemaker  . Kidney stone 1994-2010    Passed stone in 1994, Lithotripsy in 2010  . DDD (degenerative disc disease), lumbar   . Scoliosis   . Tinea versicolor    Past Surgical History  Procedure Laterality Date  . Knee arthroscopy with anterior cruciate ligament (acl) repair  April-1987    Left knee-HARD TO WAKE UP AFTER SX  . Cartilage repair  Nov.-1989    Left knee-HARD TO WAKE UP AFTER SX  . Lithotripsy  Jun-2010    Kidney stone (1st stone 1994 - Passed)  . Pacemaker installation  Jan-2013    Complete Heart Block     Current Outpatient Prescriptions  Medication Sig Dispense Refill  . etodolac (LODINE) 400 MG tablet Take 1 tablet (400 mg total) by mouth 2 (two) times daily as needed (with food). 60 tablet 12  . Multiple Vitamin (MULTIVITAMIN) tablet Take 1 tablet by mouth daily.    Marland Kitchen omeprazole (PRILOSEC OTC) 20 MG tablet Take 1 tablet (20 mg total) by mouth daily.    Marland Kitchen terbinafine (LAMISIL) 1 % cream Apply topically 2 (two) times daily as needed.      No current facility-administered medications for this visit.    Allergies:   Penicillins   Social History:  The  patient  reports that he has never smoked. He has never used smokeless tobacco. He reports that he drinks about 3.6 oz of alcohol per week. He reports that he does not use illicit drugs.   Family History:  The patient's family history includes Bladder Cancer in his father; Cancer (age of onset: 39) in his mother; Colon cancer in his maternal grandfather; Diabetes (age of onset: 79) in his father and mother. There is no history of Esophageal cancer, Rectal cancer, Stomach cancer, or Prostate cancer.    ROS:  Please see the history of present illness and past medical history  Otherwise, review of systems is negative  positive for .     PHYSICAL EXAM: VS:  BP 110/84 mmHg  Pulse 83  Ht 5\' 8"  (1.727 m)  Wt 205 lb 6.4 oz (93.169 kg)  BMI 31.24 kg/m2 , BMI Body mass index is 31.24 kg/(m^2). GEN: Well nourished, well developed, in no acute distress HEENT: normal Neck:  JVD flat, carotid bruits, or masses Cardiac: REGULAR RATE and RHYTHM ;no  murmurs, rubs, No S4  Back without kyphosis; No CVAT Respiratory:  clear to auscultation bilaterally, normal work of breathing GI: soft, nontender, nondistended, + BS MS: no deformity or atrophy Extremities no clubbing cyanosis   edema Skin: warm and dry,   device pocket is well healed without teathering Neuro:  Strength and sensation are intact  Psych: euthymic mood, full affect  EKG:  EKG is not ordered today. v  Device interrogation is reviewed today in detail.  See PaceArt for details.   Recent Labs: 05/09/2014: BUN 22; Creatinine 1.21; Potassium 4.9; Sodium 140    Lipid Panel     Component Value Date/Time   CHOL 130 03/08/2013 0740   TRIG 45.0 03/08/2013 0740   HDL 35.00* 03/08/2013 0740   CHOLHDL 4 03/08/2013 0740   VLDL 9.0 03/08/2013 0740   LDLCALC 86 03/08/2013 0740     Wt Readings from Last 3 Encounters:  06/19/14 205 lb 6.4 oz (93.169 kg)  05/16/14 206 lb 4 oz (93.554 kg)  09/30/13 198 lb (89.812 kg)      Other studies  Reviewed: Additional studies/ records that were reviewed today include: none       ASSESSMENT AND PLAN: Atrial tach  Intermittent complete heart block  Pacemaker  St Jude  The patient's device was interrogated.  The information was reviewed. No changes were made in the programming.    Stable   Current medicines are reviewed at length with the patient today.   The patient does not have concerns regarding his medicines.  The following changes were made today:  none  Labs/ tests ordered today include:    No orders of the defined types were placed in this encounter.     Disposition:   FU with me  1 year(s)  Signed, Virl Axe, MD  06/19/2014 11:03 AM     [place] [place] [address] [phone] (office) [phone] (fax)

## 2014-09-19 ENCOUNTER — Encounter: Payer: Self-pay | Admitting: Internal Medicine

## 2014-09-19 ENCOUNTER — Ambulatory Visit (INDEPENDENT_AMBULATORY_CARE_PROVIDER_SITE_OTHER): Payer: Commercial Indemnity | Admitting: *Deleted

## 2014-09-19 DIAGNOSIS — I442 Atrioventricular block, complete: Secondary | ICD-10-CM | POA: Diagnosis not present

## 2014-09-19 NOTE — Progress Notes (Signed)
Remote pacemaker transmission.   

## 2014-09-27 LAB — CUP PACEART REMOTE DEVICE CHECK
Battery Remaining Longevity: 104 mo
Battery Voltage: 2.96 V
Brady Statistic AP VS Percent: 17 %
Brady Statistic AS VP Percent: 41 %
Brady Statistic AS VS Percent: 41 %
Brady Statistic RA Percent Paced: 18 %
Brady Statistic RV Percent Paced: 42 %
Date Time Interrogation Session: 20160531062443
Lead Channel Impedance Value: 450 Ohm
Lead Channel Pacing Threshold Amplitude: 1.375 V
Lead Channel Pacing Threshold Amplitude: 1.5 V
Lead Channel Pacing Threshold Pulse Width: 0.4 ms
Lead Channel Pacing Threshold Pulse Width: 0.4 ms
Lead Channel Sensing Intrinsic Amplitude: 3.2 mV
Lead Channel Setting Pacing Pulse Width: 0.4 ms
Lead Channel Setting Sensing Sensitivity: 0.5 mV
MDC IDC MSMT BATTERY REMAINING PERCENTAGE: 82 %
MDC IDC MSMT LEADCHNL RV IMPEDANCE VALUE: 390 Ohm
MDC IDC MSMT LEADCHNL RV SENSING INTR AMPL: 2.2 mV
MDC IDC SET LEADCHNL RA PACING AMPLITUDE: 2.375
MDC IDC SET LEADCHNL RV PACING AMPLITUDE: 1.75 V
MDC IDC STAT BRADY AP VP PERCENT: 1 %
Pulse Gen Serial Number: 7304172

## 2014-12-19 ENCOUNTER — Ambulatory Visit (INDEPENDENT_AMBULATORY_CARE_PROVIDER_SITE_OTHER): Payer: Managed Care, Other (non HMO) | Admitting: *Deleted

## 2014-12-19 ENCOUNTER — Encounter: Payer: Self-pay | Admitting: Internal Medicine

## 2014-12-19 DIAGNOSIS — I442 Atrioventricular block, complete: Secondary | ICD-10-CM

## 2014-12-19 NOTE — Progress Notes (Signed)
Remote pacemaker transmission.   

## 2014-12-21 LAB — CUP PACEART REMOTE DEVICE CHECK
Battery Remaining Longevity: 122 mo
Brady Statistic AP VP Percent: 1 %
Brady Statistic AP VS Percent: 21 %
Brady Statistic AS VS Percent: 52 %
Brady Statistic RA Percent Paced: 22 %
Date Time Interrogation Session: 20160830070102
Lead Channel Impedance Value: 430 Ohm
Lead Channel Pacing Threshold Pulse Width: 0.4 ms
Lead Channel Pacing Threshold Pulse Width: 0.4 ms
Lead Channel Sensing Intrinsic Amplitude: 2.3 mV
Lead Channel Setting Pacing Amplitude: 1.75 V
Lead Channel Setting Sensing Sensitivity: 0.5 mV
MDC IDC MSMT BATTERY REMAINING PERCENTAGE: 95.5 %
MDC IDC MSMT BATTERY VOLTAGE: 2.96 V
MDC IDC MSMT LEADCHNL RA PACING THRESHOLD AMPLITUDE: 0.375 V
MDC IDC MSMT LEADCHNL RA SENSING INTR AMPL: 2.6 mV
MDC IDC MSMT LEADCHNL RV IMPEDANCE VALUE: 350 Ohm
MDC IDC MSMT LEADCHNL RV PACING THRESHOLD AMPLITUDE: 1.5 V
MDC IDC SET LEADCHNL RA PACING AMPLITUDE: 1.375
MDC IDC SET LEADCHNL RV PACING PULSEWIDTH: 0.4 ms
MDC IDC STAT BRADY AS VP PERCENT: 26 %
MDC IDC STAT BRADY RV PERCENT PACED: 26 %
Pulse Gen Model: 2210
Pulse Gen Serial Number: 7304172

## 2014-12-29 ENCOUNTER — Encounter: Payer: Self-pay | Admitting: *Deleted

## 2015-01-11 ENCOUNTER — Ambulatory Visit: Payer: Managed Care, Other (non HMO)

## 2015-02-01 ENCOUNTER — Ambulatory Visit (INDEPENDENT_AMBULATORY_CARE_PROVIDER_SITE_OTHER): Payer: Managed Care, Other (non HMO) | Admitting: *Deleted

## 2015-02-01 DIAGNOSIS — Z23 Encounter for immunization: Secondary | ICD-10-CM

## 2015-03-14 ENCOUNTER — Ambulatory Visit (INDEPENDENT_AMBULATORY_CARE_PROVIDER_SITE_OTHER): Payer: Managed Care, Other (non HMO)

## 2015-03-14 DIAGNOSIS — Z23 Encounter for immunization: Secondary | ICD-10-CM | POA: Diagnosis not present

## 2015-03-21 ENCOUNTER — Ambulatory Visit (INDEPENDENT_AMBULATORY_CARE_PROVIDER_SITE_OTHER): Payer: Managed Care, Other (non HMO) | Admitting: *Deleted

## 2015-03-21 DIAGNOSIS — I442 Atrioventricular block, complete: Secondary | ICD-10-CM | POA: Diagnosis not present

## 2015-03-23 NOTE — Progress Notes (Signed)
Remote pacemaker transmission.   

## 2015-03-30 LAB — CUP PACEART REMOTE DEVICE CHECK
Battery Remaining Longevity: 125 mo
Battery Remaining Percentage: 95.5 %
Brady Statistic AP VP Percent: 1 %
Brady Statistic AP VS Percent: 19 %
Brady Statistic RA Percent Paced: 19 %
Brady Statistic RV Percent Paced: 19 %
Date Time Interrogation Session: 20161130073418
Implantable Lead Implant Date: 20130115
Implantable Lead Location: 753860
Lead Channel Impedance Value: 360 Ohm
Lead Channel Impedance Value: 450 Ohm
Lead Channel Pacing Threshold Pulse Width: 0.4 ms
Lead Channel Sensing Intrinsic Amplitude: 2.5 mV
Lead Channel Setting Pacing Amplitude: 1.625
Lead Channel Setting Sensing Sensitivity: 0.5 mV
MDC IDC LEAD IMPLANT DT: 20130115
MDC IDC LEAD LOCATION: 753859
MDC IDC MSMT BATTERY VOLTAGE: 2.96 V
MDC IDC MSMT LEADCHNL RA PACING THRESHOLD AMPLITUDE: 0.375 V
MDC IDC MSMT LEADCHNL RV PACING THRESHOLD AMPLITUDE: 1.375 V
MDC IDC MSMT LEADCHNL RV PACING THRESHOLD PULSEWIDTH: 0.4 ms
MDC IDC MSMT LEADCHNL RV SENSING INTR AMPL: 3 mV
MDC IDC PG SERIAL: 7304172
MDC IDC SET LEADCHNL RA PACING AMPLITUDE: 1.375
MDC IDC SET LEADCHNL RV PACING PULSEWIDTH: 0.4 ms
MDC IDC STAT BRADY AS VP PERCENT: 18 %
MDC IDC STAT BRADY AS VS PERCENT: 62 %

## 2015-04-03 ENCOUNTER — Encounter: Payer: Self-pay | Admitting: Cardiology

## 2015-06-05 ENCOUNTER — Telehealth: Payer: Self-pay | Admitting: Family Medicine

## 2015-06-05 DIAGNOSIS — E786 Lipoprotein deficiency: Secondary | ICD-10-CM

## 2015-06-05 NOTE — Telephone Encounter (Signed)
Pt has cpx appointment with you Friday 4/17 and would like to get his labs done at [facility]  Needs to have order

## 2015-06-06 NOTE — Telephone Encounter (Signed)
Ordered. Thanks

## 2015-06-06 NOTE — Telephone Encounter (Signed)
Patient notified by telephone that order has been placed for lab work.

## 2015-06-07 ENCOUNTER — Other Ambulatory Visit (INDEPENDENT_AMBULATORY_CARE_PROVIDER_SITE_OTHER): Payer: BLUE CROSS/BLUE SHIELD

## 2015-06-07 DIAGNOSIS — E786 Lipoprotein deficiency: Secondary | ICD-10-CM

## 2015-06-07 LAB — COMPREHENSIVE METABOLIC PANEL
ALBUMIN: 4.2 g/dL (ref 3.5–5.2)
ALT: 15 U/L (ref 0–53)
AST: 15 U/L (ref 0–37)
Alkaline Phosphatase: 43 U/L (ref 39–117)
BILIRUBIN TOTAL: 1.1 mg/dL (ref 0.2–1.2)
BUN: 21 mg/dL (ref 6–23)
CALCIUM: 9.5 mg/dL (ref 8.4–10.5)
CO2: 29 mEq/L (ref 19–32)
CREATININE: 1.02 mg/dL (ref 0.40–1.50)
Chloride: 104 mEq/L (ref 96–112)
GFR: 78.04 mL/min (ref >60.00)
Glucose, Bld: 104 mg/dL — ABNORMAL HIGH (ref 70–99)
Potassium: 4.9 mEq/L (ref 3.5–5.1)
Sodium: 140 mEq/L (ref 135–145)
Total Protein: 7.4 g/dL (ref 6.0–8.3)

## 2015-06-07 LAB — LIPID PANEL
CHOLESTEROL: 142 mg/dL (ref 0–200)
HDL: 42.1 mg/dL (ref >39.00)
LDL Cholesterol: 91 mg/dL (ref 0–99)
NonHDL: 100.19
TRIGLYCERIDES: 46 mg/dL (ref 0.0–149.0)
Total CHOL/HDL Ratio: 3
VLDL: 9.2 mg/dL (ref 0.0–40.0)

## 2015-06-08 ENCOUNTER — Ambulatory Visit (INDEPENDENT_AMBULATORY_CARE_PROVIDER_SITE_OTHER): Payer: BLUE CROSS/BLUE SHIELD | Admitting: Family Medicine

## 2015-06-08 ENCOUNTER — Encounter: Payer: Self-pay | Admitting: Family Medicine

## 2015-06-08 VITALS — BP 126/84 | HR 73 | Temp 98.1°F | Ht 68.0 in | Wt 181.8 lb

## 2015-06-08 DIAGNOSIS — K219 Gastro-esophageal reflux disease without esophagitis: Secondary | ICD-10-CM

## 2015-06-08 DIAGNOSIS — N529 Male erectile dysfunction, unspecified: Secondary | ICD-10-CM

## 2015-06-08 DIAGNOSIS — Z0000 Encounter for general adult medical examination without abnormal findings: Secondary | ICD-10-CM

## 2015-06-08 DIAGNOSIS — M7650 Patellar tendinitis, unspecified knee: Secondary | ICD-10-CM

## 2015-06-08 DIAGNOSIS — G4700 Insomnia, unspecified: Secondary | ICD-10-CM

## 2015-06-08 DIAGNOSIS — Z119 Encounter for screening for infectious and parasitic diseases, unspecified: Secondary | ICD-10-CM

## 2015-06-08 MED ORDER — ETODOLAC 400 MG PO TABS: 400.0000 mg | ORAL_TABLET | Freq: Two times a day (BID) | ORAL | Status: DC | PRN

## 2015-06-08 MED ORDER — SILDENAFIL CITRATE 20 MG PO TABS: 60.0000 mg | ORAL_TABLET | Freq: Every day | ORAL | Status: DC | PRN

## 2015-06-08 MED ORDER — SILDENAFIL CITRATE 20 MG PO TABS: 20.0000 mg | ORAL_TABLET | Freq: Three times a day (TID) | ORAL | Status: DC

## 2015-06-08 NOTE — Patient Instructions (Signed)
Increase lodine to twice a day with food.  Keep icing your knee.  If not better, then let me know.  Keep exercising.  Try the sildenafil in the meantime.  Take care. Glad to see you.

## 2015-06-08 NOTE — Progress Notes (Signed)
Pre visit review using our clinic review tool, if applicable. No additional management support is needed unless otherwise documented below in the visit note.  CPE- See plan.  Routine anticipatory guidance given to patient.  See health maintenance. Tetanus 2016 Flu 2016 PNA due at 65 Shingles 2016  Colon 2014 per GI.  Prostate cancer screening and PSA options (with potential risks and benefits of testing vs not testing) were discussed along with recent recs/guidelines. He declined testing PSA at this point. Living will d/w pt. Wife designated if patient were incapacitated.  Diet and exercise d/w pt. Going to gym QOD. Intentional weight loss with diet and exercise.   HIV and HCV screening d/w pt.  HIV neg in 2004 per patient report.    L knee pain, improved with jumper's knee strap.  Now with still some L lateral knee pain.  Taking lodine QD prn for pain w/o ADE.    GERD.  Better with diet.  Rare PPI use now.  Intentional weight loss.  Diet and exercise d/w pt.    ED d/w pt.  Had tried viagra in the past.  Did better once he got off BP meds prev.    He has a longstanding h/o insomnia with trouble waking in the middle of the night.  This has been going on for years.  occ OTC med use for insomnia with some relief.    PMH and SH reviewed  Meds, vitals, and allergies reviewed.   ROS: See HPI.  Otherwise negative.    GEN: nad, alert and oriented HEENT: mucous membranes moist NECK: supple w/o LA CV: rrr. PULM: ctab, no inc wob ABD: soft, +bs EXT: no edema SKIN: no acute rash L knee with slightly puffiness laterally but not bruised or red.  Normal ROM, ACL MCL LDL feel solid.  Some crepitus on ROM.  Patella not ttp.

## 2015-06-11 DIAGNOSIS — N529 Male erectile dysfunction, unspecified: Secondary | ICD-10-CM | POA: Insufficient documentation

## 2015-06-11 DIAGNOSIS — G4700 Insomnia, unspecified: Secondary | ICD-10-CM | POA: Insufficient documentation

## 2015-06-11 NOTE — Assessment & Plan Note (Addendum)
Tetanus 2016 Flu 2016 PNA due at 65 Shingles 2016  Colon 2014 per GI.  Prostate cancer screening and PSA options (with potential risks and benefits of testing vs not testing) were discussed along with recent recs/guidelines. He declined testing PSA at this point. Living will d/w pt. Wife designated if patient were incapacitated.  Diet and exercise d/w pt. Going to gym QOD. Intentional weight loss with diet and exercise.   HIV and HCV screening d/w pt.  HIV neg in 2004 per patient report.   Continue work on diet and exercise for mild sugar elevation, d/w pt.

## 2015-06-11 NOTE — Assessment & Plan Note (Signed)
Can try sildenafil prn.  D/w pt.  He agrees.

## 2015-06-11 NOTE — Assessment & Plan Note (Signed)
Continue prn PPI with continued diet and exercise.

## 2015-06-11 NOTE — Assessment & Plan Note (Signed)
Continue prn OTC tx.

## 2015-06-11 NOTE — Assessment & Plan Note (Signed)
Improved prev with jumper's knee strap, likely with OA changes now.  D/w pt.  Continue with OTC knee sleeve, take lodine BID prn with food as needed/tolerated with GI cautions and update me as needed.  Imaging at this point likely not to change mgmt.  He agrees.

## 2015-08-01 NOTE — Progress Notes (Signed)
Electrophysiology Office Note   Date:  08/02/2015   ID:  William Curry, DOB 1950/06/17, MRN [id number]  PCP:  Elsie Stain, MD  Cardiologist:   Primary Electrophysiologist:   Virl Axe, MD    No chief complaint on file.    History of Present Illness: William Curry is a 65 y.o. male i  Seen in followup for previously implanted pacemaker undertaken for complete heart block-intermittent   Today, he denies  symptoms of palpitations, chest pain, shortness of breath, orthopnea, PND, lower extremity edema, claudication, dizziness, presyncope, syncope, bleeding, or neurologic sequela. The patient is tolerating medications without difficulties and is otherwise without complaint today.    DATE Pacing percentage  2/16      16  4/17      15      Past Medical History  Diagnosis Date  . History of chicken pox   . Hypertension 2002-2005  . Heart block AV complete ([place]) 2013    Pacemaker  . Kidney stone 1994-2010    Passed stone in 1994, Lithotripsy in 2010  . DDD (degenerative disc disease), lumbar   . Scoliosis   . Tinea versicolor   . GERD (gastroesophageal reflux disease)    Past Surgical History  Procedure Laterality Date  . Knee arthroscopy with anterior cruciate ligament (acl) repair  April-1987    Left knee-HARD TO WAKE UP AFTER SX  . Cartilage repair  Nov.-1989    Left knee-HARD TO WAKE UP AFTER SX  . Lithotripsy  Jun-2010    Kidney stone (1st stone 1994 - Passed)  . Pacemaker installation  Jan-2013    Complete Heart Block     Current Outpatient Prescriptions  Medication Sig Dispense Refill  . etodolac (LODINE) 400 MG tablet Take 1 tablet (400 mg total) by mouth 2 (two) times daily as needed (with food). 60 tablet 12  . Multiple Vitamin (MULTIVITAMIN) tablet Take 1 tablet by mouth daily.    Marland Kitchen omeprazole (PRILOSEC OTC) 20 MG tablet Take 1 tablet (20 mg total) by mouth daily.    . sildenafil (REVATIO) 20 MG tablet Take 3-5 tablets (60-100 mg total) by mouth daily as  needed. 50 tablet 1  . terbinafine (LAMISIL) 1 % cream Apply topically 2 (two) times daily as needed.      No current facility-administered medications for this visit.    Allergies:   Penicillins   Social History:  The patient  reports that he has never smoked. He has never used smokeless tobacco. He reports that he drinks about 3.6 oz of alcohol per week. He reports that he does not use illicit drugs.   Family History:  The patient's family history includes Bladder Cancer in his father; Cancer (age of onset: 67) in his mother; Colon cancer in his maternal grandfather; Diabetes (age of onset: 60) in his father and mother. There is no history of Esophageal cancer, Rectal cancer, Stomach cancer, or Prostate cancer.    ROS:  Please see the history of present illness and past medical history  Otherwise, review of systems is negative  positive for .     PHYSICAL EXAM: VS:  BP 122/84 mmHg  Pulse 84  Ht 5\' 9"  (1.753 m)  Wt 181 lb 6.4 oz (82.283 kg)  BMI 26.78 kg/m2 , BMI Body mass index is 26.78 kg/(m^2). Well developed and nourished in no acute distress HENT normal Neck supple   Clear Device pocket well healed; without hematoma or erythema.  There is no tethering  Regular rate  and rhythm, no murmurs or gallops Abd-soft with active BS without hepatomegaly No Clubbing cyanosis edema Skin-warm and dry A & Oriented  Grossly normal sensory and motor function  EKG:   Sinus rhythm at 84 Intervals 23/14/41 Axis left -47 Right bundle branch left anterior fascicular block v  Device interrogation is reviewed today in detail.  See PaceArt for details.   Recent Labs: 06/07/2015: ALT 15; BUN 21; Creatinine, Ser 1.02; Potassium 4.9; Sodium 140    Lipid Panel     Component Value Date/Time   CHOL 142 06/07/2015 0727   TRIG 46.0 06/07/2015 0727   HDL 42.10 06/07/2015 0727   CHOLHDL 3 06/07/2015 0727   VLDL 9.2 06/07/2015 0727   LDLCALC 91 06/07/2015 0727     Wt Readings from Last 3  Encounters:  08/02/15 181 lb 6.4 oz (82.283 kg)  06/08/15 181 lb 12 oz (82.441 kg)  06/19/14 205 lb 6.4 oz (93.169 kg)      Other studies Reviewed: Additional studies/ records that were reviewed today include: none       ASSESSMENT AND PLAN: Atrial tach  Intermittent complete heart block  Trifascicular block    Pacemaker  St Jude  The patient's device was interrogated.  The information was reviewed. No changes were made in the programming.    Stable   Current medicines are reviewed at length with the patient today.   The patient does not have concerns regarding his medicines.  The following changes were made today:  none  Labs/ tests ordered today include:    No orders of the defined types were placed in this encounter.     Disposition:   FU with me  1 year(s)  Signed, Virl Axe, MD  08/02/2015 3:28 PM     [place] [address] [phone] (office) [phone] (fax)

## 2015-08-02 ENCOUNTER — Ambulatory Visit (INDEPENDENT_AMBULATORY_CARE_PROVIDER_SITE_OTHER): Payer: BLUE CROSS/BLUE SHIELD | Admitting: Internal Medicine

## 2015-08-02 ENCOUNTER — Encounter: Payer: Self-pay | Admitting: Internal Medicine

## 2015-08-02 VITALS — BP 122/84 | HR 84 | Ht 69.0 in | Wt 181.4 lb

## 2015-08-02 DIAGNOSIS — I442 Atrioventricular block, complete: Secondary | ICD-10-CM | POA: Diagnosis not present

## 2015-08-02 DIAGNOSIS — Z950 Presence of cardiac pacemaker: Secondary | ICD-10-CM | POA: Diagnosis not present

## 2015-08-02 NOTE — Patient Instructions (Signed)
Medication Instructions:  Your physician recommends that you continue on your current medications as directed. Please refer to the Current Medication list given to you today.   Labwork: none  Testing/Procedures: none  Follow-Up: Remote monitoring is used to monitor your Pacemaker or ICD from home. This monitoring reduces the number of office visits required to check your device to one time per year. It allows [place] to keep an eye on the functioning of your device to ensure it is working properly. You are scheduled for a device check from home on 11/01/2015. You may send your transmission at any time that day. If you have a wireless device, the transmission will be sent automatically. After your physician reviews your transmission, you will receive a postcard with your next transmission date.  Your physician wants you to follow-up in: 12 months with Dr. Caryl Comes. You will receive a reminder letter in the mail two months in advance. If you don't receive a letter, please call our office to schedule the follow-up appointment.   Any Other Special Instructions Will Be Listed Below (If Applicable).     If you need a refill on your cardiac medications before your next appointment, please call your pharmacy.

## 2015-08-03 ENCOUNTER — Encounter: Payer: Managed Care, Other (non HMO) | Admitting: Internal Medicine

## 2015-11-01 ENCOUNTER — Ambulatory Visit (INDEPENDENT_AMBULATORY_CARE_PROVIDER_SITE_OTHER): Payer: BLUE CROSS/BLUE SHIELD | Admitting: *Deleted

## 2015-11-01 ENCOUNTER — Telehealth: Payer: Self-pay | Admitting: Cardiology

## 2015-11-01 DIAGNOSIS — I442 Atrioventricular block, complete: Secondary | ICD-10-CM

## 2015-11-01 NOTE — Telephone Encounter (Signed)
Spoke with pt and reminded pt of remote transmission that is due today. Pt verbalized understanding.   

## 2015-11-01 NOTE — Progress Notes (Signed)
Remote pacemaker transmission.   

## 2015-11-06 LAB — CUP PACEART REMOTE DEVICE CHECK
Brady Statistic AP VP Percent: 1 %
Brady Statistic AP VS Percent: 23 %
Brady Statistic AS VP Percent: 10 %
Brady Statistic RV Percent Paced: 11 %
Date Time Interrogation Session: 20170713171148
Implantable Lead Implant Date: 20130115
Implantable Lead Location: 753859
Lead Channel Impedance Value: 390 Ohm
Lead Channel Pacing Threshold Amplitude: 1.375 V
Lead Channel Sensing Intrinsic Amplitude: 3 mV
Lead Channel Setting Pacing Amplitude: 1.625
Lead Channel Setting Pacing Pulse Width: 0.4 ms
Lead Channel Setting Sensing Sensitivity: 0.5 mV
MDC IDC LEAD IMPLANT DT: 20130115
MDC IDC LEAD LOCATION: 753860
MDC IDC MSMT BATTERY REMAINING LONGEVITY: 118 mo
MDC IDC MSMT BATTERY REMAINING PERCENTAGE: 91 %
MDC IDC MSMT BATTERY VOLTAGE: 2.95 V
MDC IDC MSMT LEADCHNL RA IMPEDANCE VALUE: 490 Ohm
MDC IDC MSMT LEADCHNL RA PACING THRESHOLD AMPLITUDE: 0.5 V
MDC IDC MSMT LEADCHNL RA PACING THRESHOLD PULSEWIDTH: 0.4 ms
MDC IDC MSMT LEADCHNL RA SENSING INTR AMPL: 3.4 mV
MDC IDC MSMT LEADCHNL RV PACING THRESHOLD PULSEWIDTH: 0.4 ms
MDC IDC SET LEADCHNL RA PACING AMPLITUDE: 1.5 V
MDC IDC STAT BRADY AS VS PERCENT: 65 %
MDC IDC STAT BRADY RA PERCENT PACED: 24 %
Pulse Gen Model: 2210
Pulse Gen Serial Number: 7304172

## 2015-11-07 ENCOUNTER — Encounter: Payer: Self-pay | Admitting: Cardiology

## 2015-11-08 ENCOUNTER — Other Ambulatory Visit: Payer: Self-pay | Admitting: Family Medicine

## 2015-11-08 NOTE — Telephone Encounter (Deleted)
Electronic refill request. Last Filled:    

## 2015-11-08 NOTE — Telephone Encounter (Signed)
Electronic refill request.  Medication is not on current meds list.  Please advise.  

## 2015-11-09 NOTE — Telephone Encounter (Signed)
Has used prev, okay to restart as needed.  Thanks. Sent.

## 2016-01-31 ENCOUNTER — Ambulatory Visit (INDEPENDENT_AMBULATORY_CARE_PROVIDER_SITE_OTHER): Payer: BLUE CROSS/BLUE SHIELD | Admitting: *Deleted

## 2016-01-31 ENCOUNTER — Telehealth: Payer: Self-pay | Admitting: Cardiology

## 2016-01-31 DIAGNOSIS — Z950 Presence of cardiac pacemaker: Secondary | ICD-10-CM | POA: Diagnosis not present

## 2016-01-31 DIAGNOSIS — I442 Atrioventricular block, complete: Secondary | ICD-10-CM | POA: Diagnosis not present

## 2016-01-31 NOTE — Telephone Encounter (Signed)
LMOVM reminding pt to send remote transmission.   

## 2016-02-01 ENCOUNTER — Encounter: Payer: Self-pay | Admitting: Cardiology

## 2016-02-01 NOTE — Progress Notes (Signed)
Remote pacemaker transmission.   

## 2016-03-03 LAB — CUP PACEART REMOTE DEVICE CHECK
Battery Remaining Percentage: 91 %
Battery Voltage: 2.95 V
Brady Statistic AP VP Percent: 1 %
Brady Statistic AS VP Percent: 8.6 %
Brady Statistic AS VS Percent: 69 %
Brady Statistic RA Percent Paced: 22 %
Date Time Interrogation Session: 20171012204617
Implantable Lead Implant Date: 20130115
Implantable Lead Location: 753860
Implantable Pulse Generator Implant Date: 20130115
Lead Channel Pacing Threshold Amplitude: 0.625 V
Lead Channel Pacing Threshold Pulse Width: 0.4 ms
Lead Channel Pacing Threshold Pulse Width: 0.4 ms
Lead Channel Sensing Intrinsic Amplitude: 4.9 mV
Lead Channel Setting Pacing Amplitude: 1.875
MDC IDC LEAD IMPLANT DT: 20130115
MDC IDC LEAD LOCATION: 753859
MDC IDC MSMT BATTERY REMAINING LONGEVITY: 118 mo
MDC IDC MSMT LEADCHNL RA IMPEDANCE VALUE: 550 Ohm
MDC IDC MSMT LEADCHNL RV IMPEDANCE VALUE: 400 Ohm
MDC IDC MSMT LEADCHNL RV PACING THRESHOLD AMPLITUDE: 1.625 V
MDC IDC MSMT LEADCHNL RV SENSING INTR AMPL: 2.8 mV
MDC IDC SET LEADCHNL RA PACING AMPLITUDE: 1.625
MDC IDC SET LEADCHNL RV PACING PULSEWIDTH: 0.4 ms
MDC IDC SET LEADCHNL RV SENSING SENSITIVITY: 0.5 mV
MDC IDC STAT BRADY AP VS PERCENT: 21 %
MDC IDC STAT BRADY RV PERCENT PACED: 9.1 %
Pulse Gen Model: 2210
Pulse Gen Serial Number: 7304172

## 2016-03-03 NOTE — Progress Notes (Signed)
Normal remote reviewed. 22 mode switch episodes, longest <30 minutes, atrial tachycardia 2 ventricular noise reversions  Next Merlin 05/01/16

## 2016-03-07 ENCOUNTER — Encounter: Payer: Self-pay | Admitting: Family Medicine

## 2016-05-01 ENCOUNTER — Encounter: Payer: Self-pay | Admitting: Internal Medicine

## 2016-05-01 ENCOUNTER — Ambulatory Visit (INDEPENDENT_AMBULATORY_CARE_PROVIDER_SITE_OTHER): Payer: BLUE CROSS/BLUE SHIELD | Admitting: *Deleted

## 2016-05-01 DIAGNOSIS — I442 Atrioventricular block, complete: Secondary | ICD-10-CM

## 2016-05-01 NOTE — Progress Notes (Signed)
Remote pacemaker transmission.   

## 2016-05-02 ENCOUNTER — Encounter: Payer: Self-pay | Admitting: Cardiology

## 2016-05-15 LAB — CUP PACEART REMOTE DEVICE CHECK
Battery Remaining Percentage: 91 %
Battery Voltage: 2.95 V
Brady Statistic AS VS Percent: 68 %
Implantable Lead Implant Date: 20130115
Implantable Lead Location: 753859
Implantable Pulse Generator Implant Date: 20130115
Lead Channel Impedance Value: 440 Ohm
Lead Channel Pacing Threshold Amplitude: 0.5 V
Lead Channel Pacing Threshold Pulse Width: 0.4 ms
Lead Channel Sensing Intrinsic Amplitude: 2.6 mV
Lead Channel Setting Pacing Amplitude: 2.125
MDC IDC LEAD IMPLANT DT: 20130115
MDC IDC LEAD LOCATION: 753860
MDC IDC MSMT BATTERY REMAINING LONGEVITY: 116 mo
MDC IDC MSMT LEADCHNL RA PACING THRESHOLD PULSEWIDTH: 0.4 ms
MDC IDC MSMT LEADCHNL RV IMPEDANCE VALUE: 390 Ohm
MDC IDC MSMT LEADCHNL RV PACING THRESHOLD AMPLITUDE: 1.875 V
MDC IDC MSMT LEADCHNL RV SENSING INTR AMPL: 3.2 mV
MDC IDC SESS DTM: 20180111080050
MDC IDC SET LEADCHNL RA PACING AMPLITUDE: 1.5 V
MDC IDC SET LEADCHNL RV PACING PULSEWIDTH: 0.4 ms
MDC IDC SET LEADCHNL RV SENSING SENSITIVITY: 0.5 mV
MDC IDC STAT BRADY AP VP PERCENT: 1 %
MDC IDC STAT BRADY AP VS PERCENT: 19 %
MDC IDC STAT BRADY AS VP PERCENT: 11 %
MDC IDC STAT BRADY RA PERCENT PACED: 20 %
MDC IDC STAT BRADY RV PERCENT PACED: 11 %
Pulse Gen Serial Number: 7304172

## 2016-07-21 ENCOUNTER — Encounter: Payer: Self-pay | Admitting: Internal Medicine

## 2016-08-05 ENCOUNTER — Ambulatory Visit (INDEPENDENT_AMBULATORY_CARE_PROVIDER_SITE_OTHER): Payer: BLUE CROSS/BLUE SHIELD | Admitting: Internal Medicine

## 2016-08-05 ENCOUNTER — Encounter: Payer: Self-pay | Admitting: Internal Medicine

## 2016-08-05 VITALS — BP 116/76 | HR 79 | Ht 69.0 in | Wt 185.0 lb

## 2016-08-05 DIAGNOSIS — I442 Atrioventricular block, complete: Secondary | ICD-10-CM

## 2016-08-05 DIAGNOSIS — I471 Supraventricular tachycardia: Secondary | ICD-10-CM

## 2016-08-05 DIAGNOSIS — Z950 Presence of cardiac pacemaker: Secondary | ICD-10-CM

## 2016-08-05 LAB — CUP PACEART INCLINIC DEVICE CHECK
Battery Voltage: 2.95 V
Brady Statistic RA Percent Paced: 18 %
Implantable Lead Implant Date: 20130115
Implantable Lead Location: 753859
Implantable Pulse Generator Implant Date: 20130115
Lead Channel Impedance Value: 437.5 Ohm
Lead Channel Pacing Threshold Amplitude: 0.5 V
Lead Channel Pacing Threshold Amplitude: 1.25 V
Lead Channel Pacing Threshold Pulse Width: 0.4 ms
Lead Channel Pacing Threshold Pulse Width: 0.6 ms
Lead Channel Pacing Threshold Pulse Width: 0.6 ms
Lead Channel Sensing Intrinsic Amplitude: 3.9 mV
Lead Channel Setting Pacing Amplitude: 1.875
MDC IDC LEAD IMPLANT DT: 20130115
MDC IDC LEAD LOCATION: 753860
MDC IDC MSMT LEADCHNL RA PACING THRESHOLD AMPLITUDE: 0.5 V
MDC IDC MSMT LEADCHNL RA PACING THRESHOLD PULSEWIDTH: 0.4 ms
MDC IDC MSMT LEADCHNL RV IMPEDANCE VALUE: 350 Ohm
MDC IDC MSMT LEADCHNL RV PACING THRESHOLD AMPLITUDE: 1.25 V
MDC IDC MSMT LEADCHNL RV SENSING INTR AMPL: 3.4 mV
MDC IDC PG SERIAL: 7304172
MDC IDC SESS DTM: 20180417180444
MDC IDC SET LEADCHNL RA PACING AMPLITUDE: 1.375
MDC IDC SET LEADCHNL RV PACING PULSEWIDTH: 0.4 ms
MDC IDC SET LEADCHNL RV SENSING SENSITIVITY: 0.5 mV
MDC IDC STAT BRADY RV PERCENT PACED: 9.5 %
Pulse Gen Model: 2210

## 2016-08-05 NOTE — Progress Notes (Signed)
Electrophysiology Office Note   Date:  08/05/2016   ID:  Gilverto Dileonardo, DOB [birth year], MRN [id number]  PCP:  Elsie Stain, MD  Cardiologist:   Primary Electrophysiologist:   Virl Axe, MD    No chief complaint on file.    History of Present Illness: Naphtali Zywicki is a 66 y.o. male i  Seen in followup for previously implanted pacemaker undertaken for complete heart block-intermittent  Today, he denies  symptoms of palpitations, chest pain, shortness of breath, orthopnea, PND, lower extremity edema, claudication, dizziness, presyncope, syncope, bleeding, or neurologic sequela. The patient is tolerating medications without difficulties and is otherwise without complaint today. Unchanged from last year    DATE Pacing percentage  2/16 16  4/17 15  4/18 10         Past Medical History:  Diagnosis Date  . DDD (degenerative disc disease), lumbar   . GERD (gastroesophageal reflux disease)   . Heart block AV complete ([place]) 2013   Pacemaker  . History of chicken pox   . Hypertension 2002-2005  . Kidney stone 1994-2010   Passed stone in 1994, Lithotripsy in 2010  . Scoliosis   . Tinea versicolor    Past Surgical History:  Procedure Laterality Date  . CARTILAGE REPAIR  Nov.-1989   Left knee-HARD TO WAKE UP AFTER SX  . KNEE ARTHROSCOPY WITH ANTERIOR CRUCIATE LIGAMENT (ACL) REPAIR  [phone]   Left knee-HARD TO WAKE UP AFTER SX  . LITHOTRIPSY  Jun-2010   Kidney stone (1st stone 1994 - Passed)  . PACEMAKER INSTALLATION  Jan-2013   Complete Heart Block     Current Outpatient Prescriptions  Medication Sig Dispense Refill  . etodolac (LODINE) 400 MG tablet Take 1 tablet (400 mg total) by mouth 2 (two) times daily as needed (with food). 60 tablet 12  . ketoconazole (NIZORAL) 200 MG tablet TAKE 1 TABLET (200 MG TOTAL) BY MOUTH DAILY. FOR 2 WEEKS. 14 tablet 1  . Multiple Vitamin (MULTIVITAMIN) tablet Take 1 tablet by mouth daily.    Marland Kitchen omeprazole (PRILOSEC OTC) 20 MG tablet Take  1 tablet (20 mg total) by mouth daily.    . sildenafil (REVATIO) 20 MG tablet Take 3-5 tablets (60-100 mg total) by mouth daily as needed. 50 tablet 1  . terbinafine (LAMISIL) 1 % cream Apply topically 2 (two) times daily as needed.      No current facility-administered medications for this visit.     Allergies:   Penicillins   Social History:  The patient  reports that he has never smoked. He has never used smokeless tobacco. He reports that he drinks about 3.6 oz of alcohol per week . He reports that he does not use drugs.   Family History:  The patient's family history includes Bladder Cancer in his father; Cancer (age of onset: 59) in his mother; Colon cancer in his maternal grandfather; Diabetes (age of onset: 2) in his father and mother.    ROS:  Please see the history of present illness and past medical history  Otherwise, review of systems is negative  positive for .     PHYSICAL EXAM: VS:  BP 116/76   Pulse 79   Ht 5\' 9"  (1.753 m)   Wt 185 lb (83.9 kg)   SpO2 96%   BMI 27.32 kg/m  , BMI Body mass index is 27.32 kg/m. Well developed and nourished in no acute distress HENT normal Neck supple   Clear Device pocket well healed; without hematoma or erythema.  There is no tethering  Regular rate and rhythm, no murmurs or gallops Abd-soft with active BS without hepatomegaly No Clubbing cyanosis edema Skin-warm and dry A & Oriented  Grossly normal sensory and motor function  EKG:   Sinus rhythm at 74 22/14/43 RBBB LAD/LAFB   ce interrogation is reviewed today in detail.  See PaceArt for details.   Recent Labs: No results found for requested labs within last 8760 hours.    Lipid Panel     Component Value Date/Time   CHOL 142 06/07/2015 0727   TRIG 46.0 06/07/2015 0727   HDL 42.10 06/07/2015 0727   CHOLHDL 3 06/07/2015 0727   VLDL 9.2 06/07/2015 0727   LDLCALC 91 06/07/2015 0727     Wt Readings from Last 3 Encounters:  08/05/16 185 lb (83.9 kg)  08/02/15  181 lb 6.4 oz (82.3 kg)  06/08/15 181 lb 12 oz (82.4 kg)      Other studies Reviewed: Additional studies/ records that were reviewed today include: none       ASSESSMENT AND PLAN: Atrial tach  Intermittent complete heart block  Trifascicular block    Pacemaker  St Jude  The patient's device was interrogated.  The information was reviewed. No changes were made in the programming.    Stable   VP percentage down   Interval Atrial tach < 20 min     Current medicines are reviewed at length with the patient today.   The patient does not have concerns regarding his medicines.  The following changes were made today:  none  Labs/ tests ordered today include:    No orders of the defined types were placed in this encounter.    Disposition:   FU with me  1 year(s)  Signed, Virl Axe, MD  08/05/2016 2:21 PM     [place] [address] [phone] (office) [phone] (fax)

## 2016-08-05 NOTE — Patient Instructions (Signed)
Medication Instructions: - Your physician recommends that you continue on your current medications as directed. Please refer to the Current Medication list given to you today.  Labwork: - none ordered  Procedures/Testing: - none ordered  Follow-Up: - Remote monitoring is used to monitor your Pacemaker of ICD from home. This monitoring reduces the number of office visits required to check your device to one time per year. It allows [place] to keep an eye on the functioning of your device to ensure it is working properly. You are scheduled for a device check from home on 11/04/16. You may send your transmission at any time that day. If you have a wireless device, the transmission will be sent automatically. After your physician reviews your transmission, you will receive a postcard with your next transmission date.  -  Your physician wants you to follow-up in: 1 year with Tommye Standard, PA for Dr. Caryl Comes. You will receive a reminder letter in the mail two months in advance. If you don't receive a letter, please call our office to schedule the follow-up appointment.   Any Additional Special Instructions Will Be Listed Below (If Applicable).     If you need a refill on your cardiac medications before your next appointment, please call your pharmacy.

## 2016-11-03 ENCOUNTER — Other Ambulatory Visit: Payer: Self-pay | Admitting: Family Medicine

## 2016-11-03 NOTE — Telephone Encounter (Signed)
Electronic refill request. Sildenafil Last office visit:   06/08/15 Last Filled:    50 tablet 1 06/08/2015  Please advise.

## 2016-11-04 ENCOUNTER — Ambulatory Visit (INDEPENDENT_AMBULATORY_CARE_PROVIDER_SITE_OTHER): Payer: BLUE CROSS/BLUE SHIELD | Admitting: *Deleted

## 2016-11-04 DIAGNOSIS — I442 Atrioventricular block, complete: Secondary | ICD-10-CM

## 2016-11-04 NOTE — Progress Notes (Signed)
Remote pacemaker transmission.   

## 2016-11-04 NOTE — Telephone Encounter (Signed)
Patient advised.

## 2016-11-04 NOTE — Telephone Encounter (Signed)
Needs CPE scheduled.  Thanks.

## 2016-11-05 ENCOUNTER — Encounter: Payer: Self-pay | Admitting: Cardiology

## 2016-11-05 LAB — CUP PACEART REMOTE DEVICE CHECK
Brady Statistic AP VP Percent: 1 %
Brady Statistic AS VP Percent: 4 %
Brady Statistic RA Percent Paced: 12 %
Brady Statistic RV Percent Paced: 4.2 %
Date Time Interrogation Session: 20180717071112
Implantable Lead Implant Date: 20130115
Implantable Lead Location: 753860
Lead Channel Pacing Threshold Amplitude: 1.875 V
Lead Channel Pacing Threshold Pulse Width: 0.4 ms
Lead Channel Sensing Intrinsic Amplitude: 3.4 mV
Lead Channel Setting Pacing Amplitude: 1.375
Lead Channel Setting Pacing Pulse Width: 0.4 ms
Lead Channel Setting Sensing Sensitivity: 0.5 mV
MDC IDC LEAD IMPLANT DT: 20130115
MDC IDC LEAD LOCATION: 753859
MDC IDC MSMT BATTERY REMAINING LONGEVITY: 105 mo
MDC IDC MSMT BATTERY REMAINING PERCENTAGE: 81 %
MDC IDC MSMT BATTERY VOLTAGE: 2.93 V
MDC IDC MSMT LEADCHNL RA IMPEDANCE VALUE: 460 Ohm
MDC IDC MSMT LEADCHNL RA PACING THRESHOLD AMPLITUDE: 0.375 V
MDC IDC MSMT LEADCHNL RA SENSING INTR AMPL: 3.7 mV
MDC IDC MSMT LEADCHNL RV IMPEDANCE VALUE: 360 Ohm
MDC IDC MSMT LEADCHNL RV PACING THRESHOLD PULSEWIDTH: 0.4 ms
MDC IDC PG IMPLANT DT: 20130115
MDC IDC SET LEADCHNL RV PACING AMPLITUDE: 2.125
MDC IDC STAT BRADY AP VS PERCENT: 12 %
MDC IDC STAT BRADY AS VS PERCENT: 82 %
Pulse Gen Model: 2210
Pulse Gen Serial Number: 7304172

## 2017-02-03 ENCOUNTER — Ambulatory Visit (INDEPENDENT_AMBULATORY_CARE_PROVIDER_SITE_OTHER): Payer: BLUE CROSS/BLUE SHIELD | Admitting: *Deleted

## 2017-02-03 ENCOUNTER — Telehealth: Payer: Self-pay | Admitting: Cardiology

## 2017-02-03 DIAGNOSIS — I442 Atrioventricular block, complete: Secondary | ICD-10-CM

## 2017-02-03 NOTE — Telephone Encounter (Signed)
LMOVM reminding pt to send remote transmission.   

## 2017-02-03 NOTE — Progress Notes (Signed)
Remote pacemaker transmission.   

## 2017-02-06 ENCOUNTER — Encounter: Payer: Self-pay | Admitting: Cardiology

## 2017-02-20 ENCOUNTER — Encounter: Payer: Self-pay | Admitting: Family Medicine

## 2017-02-23 ENCOUNTER — Other Ambulatory Visit (INDEPENDENT_AMBULATORY_CARE_PROVIDER_SITE_OTHER): Payer: BLUE CROSS/BLUE SHIELD

## 2017-02-23 ENCOUNTER — Other Ambulatory Visit: Payer: Self-pay | Admitting: Family Medicine

## 2017-02-23 DIAGNOSIS — E786 Lipoprotein deficiency: Secondary | ICD-10-CM

## 2017-02-23 DIAGNOSIS — Z119 Encounter for screening for infectious and parasitic diseases, unspecified: Secondary | ICD-10-CM

## 2017-02-23 LAB — COMPREHENSIVE METABOLIC PANEL
ALT: 17 U/L (ref 0–53)
AST: 17 U/L (ref 0–37)
Albumin: 4.3 g/dL (ref 3.5–5.2)
Alkaline Phosphatase: 49 U/L (ref 39–117)
BUN: 20 mg/dL (ref 6–23)
CO2: 31 meq/L (ref 19–32)
Calcium: 9.8 mg/dL (ref 8.4–10.5)
Chloride: 103 mEq/L (ref 96–112)
Creatinine, Ser: 1.06 mg/dL (ref 0.40–1.50)
GFR: 74.26 mL/min (ref >60.00)
GLUCOSE: 91 mg/dL (ref 70–99)
POTASSIUM: 4.5 meq/L (ref 3.5–5.1)
Sodium: 140 mEq/L (ref 135–145)
Total Bilirubin: 1.3 mg/dL — ABNORMAL HIGH (ref 0.2–1.2)
Total Protein: 7.3 g/dL (ref 6.0–8.3)

## 2017-02-23 LAB — LIPID PANEL
CHOL/HDL RATIO: 4
Cholesterol: 143 mg/dL (ref 0–200)
HDL: 37.1 mg/dL — AB (ref >39.00)
LDL CALC: 90 mg/dL (ref 0–99)
NONHDL: 106.15
Triglycerides: 79 mg/dL (ref 0.0–149.0)
VLDL: 15.8 mg/dL (ref 0.0–40.0)

## 2017-02-23 LAB — CUP PACEART REMOTE DEVICE CHECK
Battery Remaining Longevity: 106 mo
Battery Remaining Percentage: 81 %
Brady Statistic AP VP Percent: 1 %
Brady Statistic AS VP Percent: 6 %
Brady Statistic RA Percent Paced: 13 %
Brady Statistic RV Percent Paced: 6.3 %
Date Time Interrogation Session: 20181016162432
Implantable Lead Implant Date: 20130115
Implantable Lead Location: 753860
Lead Channel Impedance Value: 400 Ohm
Lead Channel Impedance Value: 450 Ohm
Lead Channel Pacing Threshold Pulse Width: 0.4 ms
Lead Channel Sensing Intrinsic Amplitude: 2.9 mV
Lead Channel Setting Pacing Amplitude: 1.5 V
Lead Channel Setting Sensing Sensitivity: 0.5 mV
MDC IDC LEAD IMPLANT DT: 20130115
MDC IDC LEAD LOCATION: 753859
MDC IDC MSMT BATTERY VOLTAGE: 2.93 V
MDC IDC MSMT LEADCHNL RA PACING THRESHOLD AMPLITUDE: 0.5 V
MDC IDC MSMT LEADCHNL RA SENSING INTR AMPL: 3.9 mV
MDC IDC MSMT LEADCHNL RV PACING THRESHOLD AMPLITUDE: 1.375 V
MDC IDC MSMT LEADCHNL RV PACING THRESHOLD PULSEWIDTH: 0.4 ms
MDC IDC PG IMPLANT DT: 20130115
MDC IDC PG SERIAL: 7304172
MDC IDC SET LEADCHNL RV PACING AMPLITUDE: 1.625
MDC IDC SET LEADCHNL RV PACING PULSEWIDTH: 0.4 ms
MDC IDC STAT BRADY AP VS PERCENT: 13 %
MDC IDC STAT BRADY AS VS PERCENT: 79 %

## 2017-02-23 NOTE — Addendum Note (Signed)
Addended by: Ellamae Sia on: 02/23/2017 09:02 AM   Modules accepted: Orders

## 2017-02-24 LAB — HEPATITIS C ANTIBODY
Hepatitis C Ab: NONREACTIVE
SIGNAL TO CUT-OFF: 0.03 (ref <1.00)

## 2017-02-27 ENCOUNTER — Ambulatory Visit (INDEPENDENT_AMBULATORY_CARE_PROVIDER_SITE_OTHER): Payer: BLUE CROSS/BLUE SHIELD | Admitting: Family Medicine

## 2017-02-27 ENCOUNTER — Encounter: Payer: Self-pay | Admitting: Family Medicine

## 2017-02-27 VITALS — BP 124/80 | HR 73 | Temp 98.4°F | Ht 69.0 in | Wt 192.5 lb

## 2017-02-27 DIAGNOSIS — Z23 Encounter for immunization: Secondary | ICD-10-CM

## 2017-02-27 DIAGNOSIS — M25569 Pain in unspecified knee: Secondary | ICD-10-CM

## 2017-02-27 DIAGNOSIS — Z0000 Encounter for general adult medical examination without abnormal findings: Secondary | ICD-10-CM

## 2017-02-27 DIAGNOSIS — B360 Pityriasis versicolor: Secondary | ICD-10-CM

## 2017-02-27 DIAGNOSIS — K219 Gastro-esophageal reflux disease without esophagitis: Secondary | ICD-10-CM

## 2017-02-27 MED ORDER — ETODOLAC 400 MG PO TABS: 400.0000 mg | ORAL_TABLET | Freq: Every day | ORAL | 12 refills | Status: DC

## 2017-02-27 MED ORDER — KETOCONAZOLE 200 MG PO TABS: ORAL_TABLET | ORAL | 1 refills | Status: DC

## 2017-02-27 MED ORDER — MELOXICAM 15 MG PO TABS: 7.5000 mg | ORAL_TABLET | Freq: Every day | ORAL | 3 refills | Status: DC

## 2017-02-27 NOTE — Progress Notes (Signed)
CPE- See plan.  Routine anticipatory guidance given to patient.  See health maintenance.  The possibility exists that previously documented standard health maintenance information may have been brought forward from a previous encounter into this note.  If needed, that same information has been updated to reflect the current situation based on today's encounter.    Tetanus 2016 Flu 2018 PNA due at 65, d/w pt.  See AVS.  Deferred today by patient.   Shingles 2016  Colon 2014 per GI.  Prostate cancer screening and PSA options (with potential risks and benefits of testing vs not testing) were discussed along with recent recs/guidelines. He declined testing PSA at this point.  Living will d/w pt. Wife designated if patient were incapacitated. Diet and exercise d/w pt. Walking for exercise.   HIV and HCV screening d/w pt.  HIV neg in 2004 per patient report.   HCV neg.    He has cards f/u in 07/2017 pending.    He passed kidney stones Oct 20, 2015 and Feb 10, 2016.   Hadn't used ketoconazole tabs recently.  He has flares of tinea and usually would get relief with med, when needed.   He has a persistent cough/throat clearing - he thought to be acid reflux and post nasal related.  Still on PPI, d/w pt about duration of use.  Some occ clear sputum.  He has been taking it for about 2 weeks, occ longer.  Then will have a period of time off the med.  The cough is better when on PPI.  No wheeze.    Left knee and scoliosis. Marland KitchenHas been taking etodolac QD, not BID.  Has been using jumper's band with some relief in the past.  D/w pt about options- it wouldn't make sense to intervene at this point, since he is still functional.  He can ice his knee after exercise and use a brace.    PMH and SH reviewed  Meds, vitals, and allergies reviewed.   ROS: Per HPI.  Unless specifically indicated otherwise in HPI, the patient denies:  General: fever. Eyes: acute vision changes ENT: sore throat Cardiovascular:  chest pain Respiratory: SOB GI: vomiting GU: dysuria Musculoskeletal: acute back pain Derm: acute rash Neuro: acute motor dysfunction Psych: worsening mood Endocrine: polydipsia Heme: bleeding Allergy: hayfever  GEN: nad, alert and oriented HEENT: mucous membranes moist NECK: supple w/o LA CV: rrr. PULM: ctab, no inc wob ABD: soft, +bs EXT: no edema SKIN: no acute rash L knee with crepitus with ROM.

## 2017-02-27 NOTE — Patient Instructions (Addendum)
Check with your insurance to see if they will cover the shingrix shot.  Flu shot today.  Due for prevnar 13 (pneumonia shot).  You can get either her or at the pharmacy.  Due for pneumovax 23 1 year later.  Change etodolac to meloxicam, either 7.5 or 15mg  a day as needed.  Don't take extra ibuprofen or aleve with it.  See if the cough gets better off the etodolac.  Update me as needed.  Take care.  Glad to see you.

## 2017-03-02 DIAGNOSIS — M25569 Pain in unspecified knee: Secondary | ICD-10-CM | POA: Insufficient documentation

## 2017-03-02 NOTE — Assessment & Plan Note (Signed)
Use ketoconazole prn.

## 2017-03-02 NOTE — Assessment & Plan Note (Signed)
Tetanus 2016 Flu 2018 PNA due at 65, d/w pt.  See AVS.  Deferred today by patient.   Shingles 2016  Colon 2014 per GI.  Prostate cancer screening and PSA options (with potential risks and benefits of testing vs not testing) were discussed along with recent recs/guidelines. He declined testing PSA at this point.  Living will d/w pt. Wife designated if patient were incapacitated. Diet and exercise d/w pt. Walking for exercise.   HIV and HCV screening d/w pt.  HIV neg in 2004 per patient report.   HCV neg.

## 2017-03-02 NOTE — Assessment & Plan Note (Signed)
Cough could be gerd related, nsaid exacerbated.  Continue PPI. Change etodolac to meloxicam, either 7.5 or 15mg  a day as needed.  Don't take extra ibuprofen or aleve with it.  He'll see if the cough gets better off the etodolac and on PPI with meloxicam.  He agrees.

## 2017-03-02 NOTE — Assessment & Plan Note (Signed)
Change etodolac to meloxicam, either 7.5 or 15mg  a day as needed.  Don't take extra ibuprofen or aleve with it.  Not at the point of needed other intervention, d/w pt.

## 2017-05-05 ENCOUNTER — Ambulatory Visit (INDEPENDENT_AMBULATORY_CARE_PROVIDER_SITE_OTHER): Payer: BLUE CROSS/BLUE SHIELD | Admitting: *Deleted

## 2017-05-05 DIAGNOSIS — I442 Atrioventricular block, complete: Secondary | ICD-10-CM | POA: Diagnosis not present

## 2017-05-07 NOTE — Progress Notes (Signed)
Remote pacemaker transmission.   

## 2017-05-08 ENCOUNTER — Encounter: Payer: Self-pay | Admitting: Cardiology

## 2017-05-08 LAB — CUP PACEART REMOTE DEVICE CHECK
Battery Voltage: 2.92 V
Brady Statistic AP VP Percent: 1 %
Brady Statistic AS VP Percent: 17 %
Brady Statistic AS VS Percent: 69 %
Brady Statistic RV Percent Paced: 18 %
Date Time Interrogation Session: 20190115081728
Implantable Lead Implant Date: 20130115
Implantable Lead Location: 753859
Implantable Pulse Generator Implant Date: 20130115
Lead Channel Impedance Value: 430 Ohm
Lead Channel Pacing Threshold Amplitude: 0.5 V
Lead Channel Pacing Threshold Pulse Width: 0.4 ms
Lead Channel Sensing Intrinsic Amplitude: 2.5 mV
Lead Channel Setting Pacing Amplitude: 1.5 V
Lead Channel Setting Pacing Amplitude: 1.875
Lead Channel Setting Pacing Pulse Width: 0.4 ms
Lead Channel Setting Sensing Sensitivity: 0.5 mV
MDC IDC LEAD IMPLANT DT: 20130115
MDC IDC LEAD LOCATION: 753860
MDC IDC MSMT BATTERY REMAINING LONGEVITY: 95 mo
MDC IDC MSMT BATTERY REMAINING PERCENTAGE: 73 %
MDC IDC MSMT LEADCHNL RA IMPEDANCE VALUE: 530 Ohm
MDC IDC MSMT LEADCHNL RA PACING THRESHOLD PULSEWIDTH: 0.4 ms
MDC IDC MSMT LEADCHNL RA SENSING INTR AMPL: 4.4 mV
MDC IDC MSMT LEADCHNL RV PACING THRESHOLD AMPLITUDE: 1.625 V
MDC IDC STAT BRADY AP VS PERCENT: 13 %
MDC IDC STAT BRADY RA PERCENT PACED: 13 %
Pulse Gen Model: 2210
Pulse Gen Serial Number: 7304172

## 2017-05-15 ENCOUNTER — Encounter: Payer: Self-pay | Admitting: Family Medicine

## 2017-05-17 ENCOUNTER — Telehealth: Payer: Self-pay | Admitting: Family Medicine

## 2017-05-17 NOTE — Telephone Encounter (Signed)
Letter printed for patient.  mychart message sent to patient.  Please make sure it printed.  Thanks.

## 2017-05-18 NOTE — Telephone Encounter (Signed)
Patient advised. Letter left at front desk for pick up.  

## 2017-07-28 ENCOUNTER — Other Ambulatory Visit: Payer: Self-pay | Admitting: Family Medicine

## 2017-07-28 NOTE — Telephone Encounter (Signed)
Electronic refill request.  Sildenafil Last office visit:   02/27/17 Last Filled:    50 tablet 0 11/04/2016  Please advise.

## 2017-07-29 NOTE — Telephone Encounter (Signed)
Sent. Thanks.   

## 2017-07-31 ENCOUNTER — Encounter: Payer: Self-pay | Admitting: Internal Medicine

## 2017-08-04 ENCOUNTER — Ambulatory Visit (INDEPENDENT_AMBULATORY_CARE_PROVIDER_SITE_OTHER): Payer: BLUE CROSS/BLUE SHIELD | Admitting: *Deleted

## 2017-08-04 DIAGNOSIS — I442 Atrioventricular block, complete: Secondary | ICD-10-CM | POA: Diagnosis not present

## 2017-08-04 NOTE — Progress Notes (Signed)
Remote pacemaker transmission.   

## 2017-08-06 ENCOUNTER — Encounter: Payer: Self-pay | Admitting: Internal Medicine

## 2017-08-06 ENCOUNTER — Encounter: Payer: Self-pay | Admitting: Cardiology

## 2017-08-06 ENCOUNTER — Ambulatory Visit: Payer: BLUE CROSS/BLUE SHIELD | Admitting: Internal Medicine

## 2017-08-06 VITALS — BP 120/80 | HR 78 | Ht 69.0 in | Wt 186.6 lb

## 2017-08-06 DIAGNOSIS — Z950 Presence of cardiac pacemaker: Secondary | ICD-10-CM | POA: Diagnosis not present

## 2017-08-06 DIAGNOSIS — I471 Supraventricular tachycardia: Secondary | ICD-10-CM | POA: Diagnosis not present

## 2017-08-06 DIAGNOSIS — I442 Atrioventricular block, complete: Secondary | ICD-10-CM | POA: Diagnosis not present

## 2017-08-06 LAB — CUP PACEART INCLINIC DEVICE CHECK
Date Time Interrogation Session: 20190418163946
Implantable Lead Implant Date: 20130115
Implantable Lead Implant Date: 20130115
Implantable Lead Location: 753859
Implantable Pulse Generator Implant Date: 20130115
MDC IDC LEAD LOCATION: 753860
Pulse Gen Model: 2210
Pulse Gen Serial Number: 7304172

## 2017-08-06 NOTE — Progress Notes (Signed)
Electrophysiology Office Note   Date:  08/06/2017   ID:  William Curry, DOB 10/18/1950, MRN [id number]  PCP:  Tonia Ghent, MD  Cardiologist:   Primary Electrophysiologist:   Virl Axe, MD    No chief complaint on file.    History of Present Illness: William Curry is a 67 y.o. male  Seen in followup for previously implanted pacemaker undertaken for complete heart block-intermittent  The patient denies chest pain, shortness of breath, nocturnal dyspnea, orthopnea or peripheral edema.  There have been no palpitations, lightheadedness or syncope.     Date Cr K  11/18 1.06 4.5          DATE PR QRS Pacing %  2/16   16  4/17 228 138 15  4/18 218 142 10  4/19 250 140 23      Past Medical History:  Diagnosis Date  . DDD (degenerative disc disease), lumbar   . GERD (gastroesophageal reflux disease)   . 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 History of chicken pox   . Hypertension 2002-2005  . Kidney stone 1994-2010   Passed stone in 1994, Lithotripsy in 2010, stones passed in 2018  . Macular degeneration   . Scoliosis   . Tinea versicolor    Past Surgical History:  Procedure Laterality Date  . CARTILAGE REPAIR  Nov.-1989   Left knee-HARD TO WAKE UP AFTER SX  . KNEE ARTHROSCOPY WITH ANTERIOR CRUCIATE LIGAMENT (ACL) REPAIR  [phone]   Left knee-HARD TO WAKE UP AFTER SX  . LITHOTRIPSY  Jun-2010   Kidney stone (1st stone 1994 - Passed)  . PACEMAKER INSTALLATION  Jan-2013   Complete Heart Block     Current Outpatient Medications  Medication Sig Dispense Refill  . ketoconazole (NIZORAL) 200 MG tablet TAKE 1 TABLET (200 MG TOTAL) BY MOUTH DAILY. FOR 2 WEEKS. 14 tablet 1  . meloxicam (MOBIC) 15 MG tablet Take 0.5-1 tablets (7.5-15 mg total) daily by mouth. With food. 90 tablet 3  . Multiple Vitamin (MULTIVITAMIN) tablet Take 1 tablet by mouth daily.    . Multiple Vitamins-Minerals (PRESERVISION AREDS 2+MULTI VIT PO) Take 1 tablet 2 (two) times daily by  mouth.    Marland Kitchen omeprazole (PRILOSEC OTC) 20 MG tablet Take 1 tablet (20 mg total) by mouth daily.    . sildenafil (REVATIO) 20 MG tablet TAKE 3 TO 5 TABLETS BY MOUTH ONCE DAILY AS NEEDED 50 tablet 12  . terbinafine (LAMISIL) 1 % cream Apply topically 2 (two) times daily as needed.      No current facility-administered medications for this visit.     Allergies:   Penicillins   Social History:  The patient  reports that he has never smoked. He has never used smokeless tobacco. He reports that he drinks about 3.6 oz of alcohol per week. He reports that he does not use drugs.   Family History:  The patient's family history includes Bladder Cancer in his father; Cancer (age of onset: 42) in his mother; Colon cancer in his maternal grandfather; Diabetes (age of onset: 46) in his father and mother.    ROS:  Please see the history of present illness and past medical history  Otherwise, review of systems is negative  positive for .     PHYSICAL EXAM: VS:  BP 120/80   Pulse 78   Ht 5\' 9"  (1.753 m)   Wt 186 lb 9.6 oz (84.6 kg)   SpO2 96%   BMI 27.56  kg/m  , BMI Body mass index is 27.56 kg/m. Well developed and nourished in no acute distress HENT normal Neck supple with JVP-flat Clear Device pocket well healed; without hematoma or erythema.  There is no tethering  Regular rate and rhythm, no murmurs or gallops Abd-soft with active BS No Clubbing cyanosis edema Skin-warm and dry A & Oriented  Grossly normal sensory and motor function   EKG:  Sinus @ 78 25/14/42     Recent Labs: 02/23/2017: ALT 17; BUN 20; Creatinine, Ser 1.06; Potassium 4.5; Sodium 140    Lipid Panel     Component Value Date/Time   CHOL 143 02/23/2017 0840   TRIG 79.0 02/23/2017 0840   HDL 37.10 (L) 02/23/2017 0840   CHOLHDL 4 02/23/2017 0840   VLDL 15.8 02/23/2017 0840   LDLCALC 90 02/23/2017 0840     Wt Readings from Last 3 Encounters:  08/06/17 186 lb 9.6 oz (84.6 kg)  02/27/17 192 lb 8 oz (87.3 kg)    08/05/16 185 lb (83.9 kg)      Other studies Reviewed: Additional studies/ records that were reviewed today include: none       ASSESSMENT AND PLAN: Atrial tach  Intermittent complete heart block  Trifascicular block    Pacemaker  St Jude  The patient's device was interrogated.  The information was reviewed. No changes were made in the programming.    No interval atrial tachycardia.  Progressive trifascicular block and increasing ventricular pacing percentage.  At this point he stable; however, I suspect he will have progressive heart block and then developed complete heart block.       Current medicines are reviewed at length with the patient today.   The patient does not have concerns regarding his medicines.  The following changes were made today:  none  Labs/ tests ordered today include:    No orders of the defined types were placed in this encounter.    Disposition:   FU with me  1 year(s)  Signed, Virl Axe, MD  08/06/2017 2:44 PM     [facility] [address] [place] [address] [phone] (office) [phone] (fax)

## 2017-08-06 NOTE — Patient Instructions (Signed)
Medication Instructions:  Your physician recommends that you continue on your current medications as directed. Please refer to the Current Medication list given to you today.  Labwork: None ordered.  Testing/Procedures: None ordered.  Follow-Up:  Your physician wants you to follow-up in: One Year with [facility]. You will receive a reminder letter in the mail two months in advance. If you don't receive a letter, please call our office to schedule the follow-up appointment.  Remote monitoring is used to monitor your ICD from home. This monitoring reduces the number of office visits required to check your device to one time per year. It allows [place] to keep an eye on the functioning of your device to ensure it is working properly. You are scheduled for a device check from home on 11/03/2017. You may send your transmission at any time that day. If you have a wireless device, the transmission will be sent automatically. After your physician reviews your transmission, you will receive a postcard with your next transmission date.    Any Other Special Instructions Will Be Listed Below (If Applicable).     If you need a refill on your cardiac medications before your next appointment, please call your pharmacy.

## 2017-08-11 LAB — CUP PACEART REMOTE DEVICE CHECK
Battery Remaining Percentage: 73 %
Battery Voltage: 2.92 V
Brady Statistic AP VP Percent: 1 %
Brady Statistic AS VS Percent: 62 %
Implantable Lead Implant Date: 20130115
Implantable Lead Location: 753859
Implantable Pulse Generator Implant Date: 20130115
Lead Channel Impedance Value: 390 Ohm
Lead Channel Pacing Threshold Amplitude: 0.375 V
Lead Channel Pacing Threshold Pulse Width: 0.4 ms
Lead Channel Sensing Intrinsic Amplitude: 4.9 mV
Lead Channel Setting Pacing Amplitude: 1.875
MDC IDC LEAD IMPLANT DT: 20130115
MDC IDC LEAD LOCATION: 753860
MDC IDC MSMT BATTERY REMAINING LONGEVITY: 93 mo
MDC IDC MSMT LEADCHNL RA IMPEDANCE VALUE: 490 Ohm
MDC IDC MSMT LEADCHNL RA PACING THRESHOLD PULSEWIDTH: 0.4 ms
MDC IDC MSMT LEADCHNL RV PACING THRESHOLD AMPLITUDE: 1.625 V
MDC IDC MSMT LEADCHNL RV SENSING INTR AMPL: 2.3 mV
MDC IDC SESS DTM: 20190416064834
MDC IDC SET LEADCHNL RA PACING AMPLITUDE: 1.375
MDC IDC SET LEADCHNL RV PACING PULSEWIDTH: 0.4 ms
MDC IDC SET LEADCHNL RV SENSING SENSITIVITY: 0.5 mV
MDC IDC STAT BRADY AP VS PERCENT: 13 %
MDC IDC STAT BRADY AS VP PERCENT: 23 %
MDC IDC STAT BRADY RA PERCENT PACED: 13 %
MDC IDC STAT BRADY RV PERCENT PACED: 24 %
Pulse Gen Serial Number: 7304172

## 2017-11-03 ENCOUNTER — Ambulatory Visit (INDEPENDENT_AMBULATORY_CARE_PROVIDER_SITE_OTHER): Payer: BLUE CROSS/BLUE SHIELD | Admitting: *Deleted

## 2017-11-03 DIAGNOSIS — I442 Atrioventricular block, complete: Secondary | ICD-10-CM | POA: Diagnosis not present

## 2017-11-03 NOTE — Progress Notes (Signed)
Remote pacemaker transmission.   

## 2017-11-04 ENCOUNTER — Encounter: Payer: Self-pay | Admitting: Cardiology

## 2017-11-14 LAB — CUP PACEART REMOTE DEVICE CHECK
Battery Remaining Longevity: 81 mo
Battery Remaining Percentage: 65 %
Battery Voltage: 2.9 V
Brady Statistic AS VP Percent: 27 %
Brady Statistic RA Percent Paced: 21 %
Date Time Interrogation Session: 20190716071919
Implantable Lead Implant Date: 20130115
Lead Channel Impedance Value: 460 Ohm
Lead Channel Pacing Threshold Amplitude: 0.375 V
Lead Channel Pacing Threshold Pulse Width: 0.4 ms
Lead Channel Sensing Intrinsic Amplitude: 2 mV
Lead Channel Sensing Intrinsic Amplitude: 3.7 mV
Lead Channel Setting Pacing Pulse Width: 0.4 ms
Lead Channel Setting Sensing Sensitivity: 0.5 mV
MDC IDC LEAD IMPLANT DT: 20130115
MDC IDC LEAD LOCATION: 753859
MDC IDC LEAD LOCATION: 753860
MDC IDC MSMT LEADCHNL RV IMPEDANCE VALUE: 390 Ohm
MDC IDC MSMT LEADCHNL RV PACING THRESHOLD AMPLITUDE: 2 V
MDC IDC MSMT LEADCHNL RV PACING THRESHOLD PULSEWIDTH: 0.4 ms
MDC IDC PG IMPLANT DT: 20130115
MDC IDC PG SERIAL: 7304172
MDC IDC SET LEADCHNL RA PACING AMPLITUDE: 1.375
MDC IDC SET LEADCHNL RV PACING AMPLITUDE: 2.25 V
MDC IDC STAT BRADY AP VP PERCENT: 2 %
MDC IDC STAT BRADY AP VS PERCENT: 19 %
MDC IDC STAT BRADY AS VS PERCENT: 52 %
MDC IDC STAT BRADY RV PERCENT PACED: 29 %

## 2018-02-01 ENCOUNTER — Encounter: Payer: Self-pay | Admitting: Family Medicine

## 2018-02-02 ENCOUNTER — Ambulatory Visit (INDEPENDENT_AMBULATORY_CARE_PROVIDER_SITE_OTHER): Payer: BLUE CROSS/BLUE SHIELD | Admitting: *Deleted

## 2018-02-02 DIAGNOSIS — I442 Atrioventricular block, complete: Secondary | ICD-10-CM

## 2018-02-03 NOTE — Progress Notes (Signed)
Remote pacemaker transmission.   

## 2018-04-10 LAB — CUP PACEART REMOTE DEVICE CHECK
Brady Statistic AP VP Percent: 7.9 %
Brady Statistic AS VP Percent: 57 %
Brady Statistic AS VS Percent: 26 %
Date Time Interrogation Session: 20191015132212
Implantable Lead Implant Date: 20130115
Implantable Lead Implant Date: 20130115
Implantable Lead Location: 753859
Lead Channel Impedance Value: 400 Ohm
Lead Channel Pacing Threshold Pulse Width: 0.4 ms
Lead Channel Sensing Intrinsic Amplitude: 11.2 mV
Lead Channel Setting Pacing Amplitude: 1.375
Lead Channel Setting Pacing Amplitude: 1.625
Lead Channel Setting Pacing Pulse Width: 0.4 ms
MDC IDC LEAD LOCATION: 753860
MDC IDC MSMT BATTERY REMAINING LONGEVITY: 72 mo
MDC IDC MSMT BATTERY REMAINING PERCENTAGE: 57 %
MDC IDC MSMT BATTERY VOLTAGE: 2.89 V
MDC IDC MSMT LEADCHNL RA IMPEDANCE VALUE: 480 Ohm
MDC IDC MSMT LEADCHNL RA PACING THRESHOLD AMPLITUDE: 0.375 V
MDC IDC MSMT LEADCHNL RA PACING THRESHOLD PULSEWIDTH: 0.4 ms
MDC IDC MSMT LEADCHNL RA SENSING INTR AMPL: 4.5 mV
MDC IDC MSMT LEADCHNL RV PACING THRESHOLD AMPLITUDE: 1.375 V
MDC IDC PG IMPLANT DT: 20130115
MDC IDC SET LEADCHNL RV SENSING SENSITIVITY: 0.5 mV
MDC IDC STAT BRADY AP VS PERCENT: 9.6 %
MDC IDC STAT BRADY RA PERCENT PACED: 17 %
MDC IDC STAT BRADY RV PERCENT PACED: 65 %
Pulse Gen Model: 2210
Pulse Gen Serial Number: 7304172

## 2018-05-04 ENCOUNTER — Ambulatory Visit (INDEPENDENT_AMBULATORY_CARE_PROVIDER_SITE_OTHER)
Admission: RE | Admit: 2018-05-04 | Discharge: 2018-05-04 | Disposition: A | Discharge status: Home / Self Care | Payer: BLUE CROSS/BLUE SHIELD | Source: Ambulatory Visit | Attending: Family Medicine | Admitting: Family Medicine

## 2018-05-04 ENCOUNTER — Ambulatory Visit: Payer: BLUE CROSS/BLUE SHIELD | Admitting: Family Medicine

## 2018-05-04 ENCOUNTER — Encounter: Payer: Self-pay | Admitting: Family Medicine

## 2018-05-04 ENCOUNTER — Ambulatory Visit (INDEPENDENT_AMBULATORY_CARE_PROVIDER_SITE_OTHER): Payer: BLUE CROSS/BLUE SHIELD

## 2018-05-04 VITALS — BP 130/84 | HR 76 | Temp 98.5°F | Ht 69.0 in | Wt 185.5 lb

## 2018-05-04 DIAGNOSIS — R059 Cough, unspecified: Secondary | ICD-10-CM

## 2018-05-04 DIAGNOSIS — R05 Cough: Secondary | ICD-10-CM

## 2018-05-04 DIAGNOSIS — R21 Rash and other nonspecific skin eruption: Secondary | ICD-10-CM

## 2018-05-04 DIAGNOSIS — M25569 Pain in unspecified knee: Secondary | ICD-10-CM | POA: Diagnosis not present

## 2018-05-04 DIAGNOSIS — I442 Atrioventricular block, complete: Secondary | ICD-10-CM

## 2018-05-04 DIAGNOSIS — K219 Gastro-esophageal reflux disease without esophagitis: Secondary | ICD-10-CM | POA: Diagnosis not present

## 2018-05-04 MED ORDER — PREDNISONE 10 MG PO TABS
ORAL_TABLET | ORAL | 0 refills | Status: DC
Start: 2018-05-04 — End: 2018-08-09

## 2018-05-04 MED ORDER — TRIAMCINOLONE ACETONIDE 0.1 % EX CREA: 1.0000 "application " | TOPICAL_CREAM | Freq: Two times a day (BID) | CUTANEOUS | 0 refills | Status: DC | PRN

## 2018-05-04 NOTE — Patient Instructions (Signed)
TAC cream for now, for itching.  If not better, then start prednisone.  Update me if not better.   We make arrangements for referrals, extra imaging, and other appointments based on the urgency of the situation. Referrals are handled based on the clinical situation, not in the order that they are placed. If you do not see one of our referral coordinators on the way out of the clinic today, then you should expect a call in the next 1 to 2 weeks. We work diligently to process all referrals as quickly as possible.    Take care.  Glad to see you.

## 2018-05-04 NOTE — Progress Notes (Addendum)
Rash.  H/o tinea over the years.  No new triggers, soaps, etc.  Itchy B rash, not dermatomal on the trunk, more near the waist band.  Not painful.  No one else with similar. No fevers.    Off meloxicam, stopped in 06/2017, then took it only for a week or so twice in the meantime.  Has been using CBD oil in the meantime for joint aches.  That seemed to help.  He had to stop meloxicam due to GERD sx but didn't get relief off med, meaning he still had GERD symptoms.    He has chronic cough and occ regurgitation.  He has occ sputum, but not discolored or bloody.  If he takes PPI then his sx get better, including the cough.    PMH and SH reviewed  ROS: Per HPI unless specifically indicated in ROS section  No fevers chills diarrhea shortness of breath chest pain etc.  Meds, vitals, and allergies reviewed.   GEN: nad, alert and oriented HEENT: mucous membranes moist NECK: supple w/o LA CV: rrr PULM: ctab, no inc wob ABD: soft, +bs EXT: no edema SKIN: B rash, not dermatomal on the trunk, more near the waist band.  Blanching, no ulceration.  No vesicles.

## 2018-05-05 ENCOUNTER — Encounter: Payer: Self-pay | Admitting: Family Medicine

## 2018-05-05 DIAGNOSIS — R05 Cough: Secondary | ICD-10-CM | POA: Insufficient documentation

## 2018-05-05 DIAGNOSIS — R059 Cough, unspecified: Secondary | ICD-10-CM | POA: Insufficient documentation

## 2018-05-05 DIAGNOSIS — R21 Rash and other nonspecific skin eruption: Secondary | ICD-10-CM | POA: Insufficient documentation

## 2018-05-05 NOTE — Assessment & Plan Note (Signed)
His knee pain improved with CBD oil.  He is off meloxicam now.

## 2018-05-05 NOTE — Assessment & Plan Note (Signed)
This does not look like tinea.  It does not look infectious.  Atypical for scabies.  It looks like a nonspecific dermatitis.  Discussed options.  He can use triamcinolone as needed.  If he gets no relief from that he can try prednisone with routine steroid cautions.  Hold prednisone for now given the reflux symptoms he was already having.  Rationale discussed with patient.  He agrees.

## 2018-05-05 NOTE — Addendum Note (Signed)
Addended by: Tonia Ghent on: 05/05/2018 08:54 PM   Modules accepted: Level of Service

## 2018-05-05 NOTE — Assessment & Plan Note (Addendum)
We talked about routine cautions and intermittent use of PPI.  I think he does have likely LPR or upper airway symptoms related to reflux.  Refer to GI.  He agrees. >25 minutes spent in face to face time with patient, >50% spent in counselling or coordination of care.

## 2018-05-05 NOTE — Assessment & Plan Note (Signed)
Likely related to GERD but reasonable to check chest x-ray today.  Lungs clear.  See notes on imaging.  Imaging independently reviewed.

## 2018-05-05 NOTE — Progress Notes (Signed)
Remote pacemaker transmission.   

## 2018-05-08 LAB — CUP PACEART REMOTE DEVICE CHECK
Battery Remaining Longevity: 62 mo
Brady Statistic AP VP Percent: 9.7 %
Brady Statistic AP VS Percent: 6.7 %
Brady Statistic AS VP Percent: 65 %
Brady Statistic RA Percent Paced: 16 %
Brady Statistic RV Percent Paced: 75 %
Implantable Lead Implant Date: 20130115
Implantable Lead Location: 753860
Implantable Pulse Generator Implant Date: 20130115
Lead Channel Impedance Value: 390 Ohm
Lead Channel Impedance Value: 490 Ohm
Lead Channel Pacing Threshold Amplitude: 1.5 V
Lead Channel Pacing Threshold Pulse Width: 0.4 ms
Lead Channel Sensing Intrinsic Amplitude: 2.3 mV
Lead Channel Setting Sensing Sensitivity: 0.5 mV
MDC IDC LEAD IMPLANT DT: 20130115
MDC IDC LEAD LOCATION: 753859
MDC IDC MSMT BATTERY REMAINING PERCENTAGE: 51 %
MDC IDC MSMT BATTERY VOLTAGE: 2.87 V
MDC IDC MSMT LEADCHNL RA PACING THRESHOLD AMPLITUDE: 0.375 V
MDC IDC MSMT LEADCHNL RA SENSING INTR AMPL: 5 mV
MDC IDC MSMT LEADCHNL RV PACING THRESHOLD PULSEWIDTH: 0.4 ms
MDC IDC PG SERIAL: 7304172
MDC IDC SESS DTM: 20200114082658
MDC IDC SET LEADCHNL RA PACING AMPLITUDE: 1.375
MDC IDC SET LEADCHNL RV PACING AMPLITUDE: 1.75 V
MDC IDC SET LEADCHNL RV PACING PULSEWIDTH: 0.4 ms
MDC IDC STAT BRADY AS VS PERCENT: 18 %
Pulse Gen Model: 2210

## 2018-08-03 ENCOUNTER — Other Ambulatory Visit: Payer: Self-pay

## 2018-08-03 ENCOUNTER — Ambulatory Visit (INDEPENDENT_AMBULATORY_CARE_PROVIDER_SITE_OTHER): Payer: BLUE CROSS/BLUE SHIELD | Admitting: *Deleted

## 2018-08-03 DIAGNOSIS — I442 Atrioventricular block, complete: Secondary | ICD-10-CM | POA: Diagnosis not present

## 2018-08-03 LAB — CUP PACEART REMOTE DEVICE CHECK
Battery Remaining Longevity: 62 mo
Battery Remaining Percentage: 51 %
Battery Voltage: 2.87 V
Brady Statistic AP VP Percent: 10 %
Brady Statistic AP VS Percent: 6.6 %
Brady Statistic AS VP Percent: 66 %
Brady Statistic AS VS Percent: 17 %
Brady Statistic RA Percent Paced: 17 %
Brady Statistic RV Percent Paced: 76 %
Date Time Interrogation Session: 20200414085935
Implantable Lead Implant Date: 20130115
Implantable Lead Implant Date: 20130115
Implantable Lead Location: 753859
Implantable Lead Location: 753860
Implantable Pulse Generator Implant Date: 20130115
Lead Channel Impedance Value: 400 Ohm
Lead Channel Impedance Value: 450 Ohm
Lead Channel Pacing Threshold Amplitude: 0.5 V
Lead Channel Pacing Threshold Amplitude: 1.75 V
Lead Channel Pacing Threshold Pulse Width: 0.4 ms
Lead Channel Pacing Threshold Pulse Width: 0.4 ms
Lead Channel Sensing Intrinsic Amplitude: 1.6 mV
Lead Channel Sensing Intrinsic Amplitude: 3.7 mV
Lead Channel Setting Pacing Amplitude: 1.5 V
Lead Channel Setting Pacing Amplitude: 2 V
Lead Channel Setting Pacing Pulse Width: 0.4 ms
Lead Channel Setting Sensing Sensitivity: 0.5 mV
Pulse Gen Model: 2210
Pulse Gen Serial Number: 7304172

## 2018-08-09 ENCOUNTER — Other Ambulatory Visit: Payer: Self-pay

## 2018-08-09 ENCOUNTER — Telehealth (INDEPENDENT_AMBULATORY_CARE_PROVIDER_SITE_OTHER): Payer: BLUE CROSS/BLUE SHIELD | Admitting: Internal Medicine

## 2018-08-09 VITALS — BP 143/82 | HR 72 | Ht 69.0 in | Wt 180.0 lb

## 2018-08-09 DIAGNOSIS — I471 Supraventricular tachycardia: Secondary | ICD-10-CM

## 2018-08-09 DIAGNOSIS — Z950 Presence of cardiac pacemaker: Secondary | ICD-10-CM

## 2018-08-09 DIAGNOSIS — I442 Atrioventricular block, complete: Secondary | ICD-10-CM

## 2018-08-09 NOTE — Progress Notes (Signed)
Electrophysiology TeleHealth Note   Due to national recommendations of social distancing due to COVID 19, an audio/video telehealth visit is felt to be most appropriate for this patient at this time.  See MyChart message from today for the patient's consent to telehealth for [facility].   Date:  08/09/2018   ID:  [name], DOB [birth year], MRN [id number]  Location: patient's home  Provider location: [address] [place]  Evaluation Performed: Follow-up visit  PCP:  Tonia Ghent, MD  Cardiologist:    Electrophysiologist:  SK   Chief Complaint:  Complete heart block intermittent   History of Present Illness:    William Curry is a 68 y.o. male who presents via audio/video conferencing for a telehealth visit today.  Since last being seen in our clinic, the patient reports that he is doing very well   DATE PR QRS Pacing %  2/16   16  4/17 228 138 15  4/18 218 142 10  4/19 250 140 23  4/20    73     The patient denies symptoms of fevers, chills, cough, or new SOB worrisome for COVID 19.    Past Medical History:  Diagnosis Date  . DDD (degenerative disc disease), lumbar   . GERD (gastroesophageal reflux disease)   . 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 History of chicken pox   . Hypertension 2002-2005  . Kidney stone 1994-2010   Passed stone in 1994, Lithotripsy in 2010, stones passed in 2018  . Macular degeneration   . Scoliosis   . Tinea versicolor     Past Surgical History:  Procedure Laterality Date  . CARTILAGE REPAIR  Nov.-1989   Left knee-HARD TO WAKE UP AFTER SX  . KNEE ARTHROSCOPY WITH ANTERIOR CRUCIATE LIGAMENT (ACL) REPAIR  [phone]   Left knee-HARD TO WAKE UP AFTER SX  . LITHOTRIPSY  Jun-2010   Kidney stone (1st stone 1994 - Passed)  . PACEMAKER INSTALLATION  Jan-2013   Complete Heart Block    Current Outpatient Medications  Medication Sig Dispense Refill  . ketoconazole (NIZORAL) 200 MG tablet TAKE 1 TABLET  (200 MG TOTAL) BY MOUTH DAILY. FOR 2 WEEKS. 14 tablet 1  . Multiple Vitamin (MULTIVITAMIN) tablet Take 1 tablet by mouth daily.    . Multiple Vitamins-Minerals (PRESERVISION AREDS 2+MULTI VIT PO) Take 1 tablet 2 (two) times daily by mouth.    Marland Kitchen omeprazole (PRILOSEC OTC) 20 MG tablet Take 1 tablet (20 mg total) by mouth daily.    . sildenafil (REVATIO) 20 MG tablet TAKE 3 TO 5 TABLETS BY MOUTH ONCE DAILY AS NEEDED 50 tablet 12  . terbinafine (LAMISIL) 1 % cream Apply topically 2 (two) times daily as needed.      No current facility-administered medications for this visit.     Allergies:   Penicillins   Social History:  The patient  reports that he has never smoked. He has never used smokeless tobacco. He reports current alcohol use of about 6.0 standard drinks of alcohol per week. He reports that he does not use drugs.   Family History:  The patient's   family history includes Bladder Cancer in his father; Cancer (age of onset: 46) in his mother; Colon cancer in his maternal grandfather; Diabetes (age of onset: 76) in his father and mother.   ROS:  Please see the history of present illness.   All other systems are personally reviewed and negative.  Exam:    Vital Signs:  BP (!) 143/82   Pulse 72   Ht 5\' 9"  (1.753 m)   Wt 180 lb (81.6 kg)   BMI 26.58 kg/m    Well appearing, alert and conversant, regular work of breathing,  good skin color Eyes- anicteric, neuro- grossly intact, skin- no apparent rash or lesions or cyanosis, mouth- oral mucosa is pink   Labs/Other Tests and Data Reviewed:    Recent Labs: No results found for requested labs within last 8760 hours.   Wt Readings from Last 3 Encounters:  08/09/18 180 lb (81.6 kg)  05/04/18 185 lb 8 oz (84.1 kg)  08/06/17 186 lb 9.6 oz (84.6 kg)     Other studies personally reviewed:    Last device remote is reviewed from [place] PDF dated 4/20  which reveals normal device function,   arrhythmias - none     ASSESSMENT &  PLAN:    Atrial tach  Intermittent complete heart block  Trifascicular block    Pacemaker  St Jude   COVID 19 screen The patient denies symptoms of COVID 19 at this time.  The importance of social distancing was discussed today.  Follow-up:  79m Next remote: As Scheduled   Current medicines are reviewed at length with the patient today.   The patient does not have concerns regarding his medicines.  The following changes were made today:  none  Labs/ tests ordered today include:   No orders of the defined types were placed in this encounter.   Future tests ( post COVID )  ECG and Echo  in 2  months  Patient Risk:  after full review of this patients clinical status, I feel that they are at moderate risk at this time.  Today, I have spent 12 minutes with the patient with telehealth technology discussing the above.  Signed, Virl Axe, MD  08/09/2018 4:53 PM     [address] [place] [address] [phone] (office) [phone] (fax)

## 2018-08-10 ENCOUNTER — Encounter: Payer: Self-pay | Admitting: Cardiology

## 2018-08-10 NOTE — Progress Notes (Signed)
Remote pacemaker transmission.   

## 2018-09-17 ENCOUNTER — Other Ambulatory Visit: Payer: Self-pay | Admitting: Family Medicine

## 2018-09-17 NOTE — Telephone Encounter (Signed)
Electronic refill request Ketoconazole Last refill 02/27/17 #14/1 Last office visit 05/04/18

## 2018-09-18 NOTE — Telephone Encounter (Signed)
Sent. Thanks.   

## 2018-09-24 ENCOUNTER — Telehealth: Payer: Self-pay

## 2018-09-24 NOTE — Telephone Encounter (Signed)
Patient advised. Patient advised that someone from Dr. Janett Billow Cop[place]'s office will call him to schedule him

## 2018-09-24 NOTE — Telephone Encounter (Signed)
This makes sense given the distance.  I will miss seeing him and I wish him the best.  App help from Dr. Lorelei Pont.  Thanks.

## 2018-09-24 NOTE — Telephone Encounter (Signed)
Left message for patient to call back to advise

## 2018-09-24 NOTE — Telephone Encounter (Signed)
Ok- I will take transfer.  Please do allow me to review transfer prior to telling a patient that I will see them in the future

## 2018-09-24 NOTE — Telephone Encounter (Signed)
Copied from [place] [id number]. Topic: Appointment Scheduling - Transfer of Care >> Sep 24, 2018 10:07 AM Richardo Priest, NT wrote: Pt is requesting to transfer FROM: Dr.Duncan, [facility] Pt is requesting to transfer TO: [place], [facility]) Reason for requested transfer: Patient states commute is too far, however [place].   Send CRM to patient's current PCP (transferring FROM).

## 2018-09-27 NOTE — Telephone Encounter (Signed)
Noted. I am sorry

## 2018-11-02 ENCOUNTER — Ambulatory Visit (INDEPENDENT_AMBULATORY_CARE_PROVIDER_SITE_OTHER): Payer: BC Managed Care – PPO | Admitting: *Deleted

## 2018-11-02 DIAGNOSIS — I442 Atrioventricular block, complete: Secondary | ICD-10-CM

## 2018-11-02 DIAGNOSIS — I447 Left bundle-branch block, unspecified: Secondary | ICD-10-CM

## 2018-11-02 LAB — CUP PACEART REMOTE DEVICE CHECK
Date Time Interrogation Session: 20200714095327
Implantable Lead Implant Date: 20130115
Implantable Lead Implant Date: 20130115
Implantable Lead Location: 753859
Implantable Lead Location: 753860
Implantable Pulse Generator Implant Date: 20130115
Pulse Gen Model: 2210
Pulse Gen Serial Number: 7304172

## 2018-11-17 NOTE — Progress Notes (Signed)
Remote pacemaker transmission.   

## 2018-12-10 NOTE — Telephone Encounter (Signed)
Pt has been scheduled with Dr. Lorelei Pont per her OK to take pt on as np.

## 2018-12-18 NOTE — Progress Notes (Signed)
[place] at [facility] [address] [place], [place] [address] [phone] [id number]  Date:  12/23/2018   Name:  William Curry   DOB:  04/07/1951   MRN:  [id number]  PCP:  Darreld Mclean, MD    Chief Complaint: Transfer of Care (flu shot, lab work fasting) and Cough (couple of years, acid reflux, medication reaction?)   History of Present Illness:  William Curry is a 68 y.o. very pleasant male patient who presents with the following:  Here today for a new patient visit- transferring care from Dr. Damita Dunnings as this location is closer to his home. I also see his wife Jenny Reichmann History of AV block with pacemaker placed in 2013, HTN  Seen by Dr. 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  He lived in [place] in the 60s, then then were in [place] for a time, came to [place] as his step-son was living here as a single dad  He has 4 kids and 6 grands in [place], and the one step-son here with 2 grands In his free time he likes to spent time with his dogs, and take care of the yard  His back issues and knee pain hold him back from playing golf like he used to His wife Jenny Reichmann sees Dr Alvan Dame- he might like to visit with him as well about his knee   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  He is not sure if this is related to reflux   Never a smoker  He does use otc prilosec as needed for his reflux - he has been using this off and on for a long time He has a colon in 2016 - normal per Dr. Sharlett Iles   He uses melatonin as  needed for sleep- he is sometimes bothered by waking up in the night to urinate and taking a long time to get back to sleep.  We discussed some conservative measures that might help with this issue  He does not want any rx meds for same at this time   Patient Active Problem List   Diagnosis Date Noted  . Knee pain 03/02/2017  . Insomnia 06/11/2015  . Erectile dysfunction 06/11/2015  . GERD (gastroesophageal reflux disease) 05/17/2014  . 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 Elevated blood pressure 06/13/2013  . Pacemaker-[employer] 05/04/2012  . LBBB (left bundle branch block) 04/02/2012  . Tinea versicolor 04/02/2012  . DDD (degenerative disc disease), lumbar 04/02/2012    Past Medical History:  Diagnosis Date  . DDD (degenerative disc disease), lumbar   . GERD (gastroesophageal reflux disease)   . 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 History of chicken pox   . Hypertension 2002-2005  . Kidney stone 1994-2010   Passed stone in 1994, Lithotripsy in 2010, stones passed in 2018  . Macular degeneration   . Scoliosis   . Tinea versicolor  Past Surgical History:  Procedure Laterality Date  . CARTILAGE REPAIR  Nov.-1989   Left knee-HARD TO WAKE UP AFTER SX  . KNEE ARTHROSCOPY WITH ANTERIOR CRUCIATE LIGAMENT (ACL) REPAIR  [phone]   Left knee-HARD TO WAKE UP AFTER SX  . LITHOTRIPSY  Jun-2010   Kidney stone (1st stone 1994 - Passed)  . PACEMAKER INSTALLATION  Jan-2013   Complete Heart Block    Social History   Tobacco Use  . Smoking status: Never Smoker  . Smokeless tobacco: Never Used  Substance Use Topics  . Alcohol use: Yes    Alcohol/week: 6.0 standard drinks    Types: 6 Cans of beer per week    Comment: 4 - 6 beers a week  . Drug use: No    Family History  Problem Relation Age of Onset  . Cancer Mother 44       Breast  . Diabetes Mother 74       on insulin  . Diabetes Father 75       on insulin  . Bladder Cancer  Father   . Colon cancer Maternal Grandfather   . Esophageal cancer Neg Hx   . Rectal cancer Neg Hx   . Stomach cancer Neg Hx   . Prostate cancer Neg Hx     Allergies  Allergen Reactions  . Penicillins Other (See Comments)    As a child    Medication list has been reviewed and updated.  Current Outpatient Medications on File Prior to Visit  Medication Sig Dispense Refill  . ketoconazole (NIZORAL) 200 MG tablet TAKE 1 TABLET (200 MG TOTAL) BY MOUTH DAILY. FOR 2 WEEKS. 14 tablet 1  . Multiple Vitamin (MULTIVITAMIN) tablet Take 1 tablet by mouth daily.    . Multiple Vitamins-Minerals (PRESERVISION AREDS 2+MULTI VIT PO) Take 1 tablet 2 (two) times daily by mouth.    Marland Kitchen omeprazole (PRILOSEC OTC) 20 MG tablet Take 1 tablet (20 mg total) by mouth daily.    . sildenafil (REVATIO) 20 MG tablet TAKE 3 TO 5 TABLETS BY MOUTH ONCE DAILY AS NEEDED 50 tablet 12  . terbinafine (LAMISIL) 1 % cream Apply topically 2 (two) times daily as needed.      No current facility-administered medications on file prior to visit.     Review of Systems:  As per HPI- otherwise negative.   Physical Examination: Vitals:   12/23/18 0836  BP: 120/80  Pulse: 80  Resp: 16  Temp: 97.6 F (36.4 C)  SpO2: 98%   Vitals:   12/23/18 0836  Weight: 180 lb (81.6 kg)  Height: 5\' 9"  (1.753 m)   Body mass index is 26.58 kg/m. Ideal Body Weight: Weight in (lb) to have BMI = 25: 168.9  GEN: WDWN, NAD, Non-toxic, A & O x 3, normal weight, looks well  HEENT: Atraumatic, Normocephalic. Neck supple. No masses, No LAD.  TM wnl bilaterally  Ears and Nose: No external deformity. CV: RRR, No M/G/R. No JVD. No thrill. No extra heart sounds. PULM: CTA B, no wheezes, crackles, rhonchi. No retractions. No resp. distress. No accessory muscle use. ABD: S, NT, ND. No rebound. No HSM. EXTR: No c/c/e NEURO Normal gait.  PSYCH: Normally interactive. Conversant. Not depressed or anxious appearing.  Calm demeanor.    Assessment  and Plan: Pacemaker-[employer]es  Screening for deficiency anemia - Plan: CBC  Screening for diabetes mellitus - Plan: Comprehensive metabolic panel, Hemoglobin A1c  Screening for hyperlipidemia - Plan: Lipid panel  Screening for prostate cancer -  Plan: PSA  Immunization due - Plan: Pneumococcal conjugate vaccine 13-valent IM  Tinea versicolor - Plan: Ambulatory referral to Dermatology  Reflux esophagitis - Plan: Ambulatory referral to Gastroenterology  Chronic pain of left knee - Plan: Ambulatory referral to Orthopedic Surgery  Here today to establish care  Labs pending as above immun updated - flu, prevnar 13 Referral to ortho, derm and GI Labs pending as above - Will plan further follow- up pending labs.  Signed Lamar Blinks, MD  Received his labs as follows, message to pt  Results for orders placed or performed in visit on 12/23/18  CBC  Result Value Ref Range   WBC 7.0 4.0 - 10.5 K/uL   RBC 4.99 4.22 - 5.81 Mil/uL   Platelets 190.0 150.0 - 400.0 K/uL   Hemoglobin 16.1 13.0 - 17.0 g/dL   HCT 47.7 39.0 - 52.0 %   MCV 95.5 78.0 - 100.0 fl   MCHC 33.7 30.0 - 36.0 g/dL   RDW 12.9 11.5 - 15.5 %  Comprehensive metabolic panel  Result Value Ref Range   Sodium 140 135 - 145 mEq/L   Potassium 4.3 3.5 - 5.1 mEq/L   Chloride 103 96 - 112 mEq/L   CO2 31 19 - 32 mEq/L   Glucose, Bld 95 70 - 99 mg/dL   BUN 20 6 - 23 mg/dL   Creatinine, Ser 1.01 0.40 - 1.50 mg/dL   Total Bilirubin 1.2 0.2 - 1.2 mg/dL   Alkaline Phosphatase 48 39 - 117 U/L   AST 17 0 - 37 U/L   ALT 16 0 - 53 U/L   Total Protein 7.1 6.0 - 8.3 g/dL   Albumin 4.3 3.5 - 5.2 g/dL   Calcium 9.2 8.4 - 10.5 mg/dL   GFR 73.46 >60.00 mL/min  Hemoglobin A1c  Result Value Ref Range   Hgb A1c MFr Bld 5.2 4.6 - 6.5 %  Lipid panel  Result Value Ref Range   Cholesterol 132 0 - 200 mg/dL   Triglycerides 59.0 0.0 - 149.0 mg/dL   HDL 37.60 (L) >39.00 mg/dL   VLDL 11.8 0.0 - 40.0 mg/dL   LDL Cholesterol 83 0 - 99  mg/dL   Total CHOL/HDL Ratio 4    NonHDL 94.53   PSA  Result Value Ref Range   PSA 1.94 0.10 - 4.00 ng/mL     Blood counts are normal Metabolic profile normal 123456- average blood sugar- normal  Your cholesterol is not bad.  However, your estimated 10 year risk of cardiovascular disease given these numbers is a bit higher than I would like- see calculation below:  The 10-year ASCVD risk score Mikey Bussing DC Brooke Bonito., et al., 2013) is: 11.9%   Values used to calculate the score:     Age: 30 years     Sex: Male     Is Non-Hispanic African American: No     Diabetic: No     Tobacco smoker: No     Systolic Blood Pressure: 123456 mmHg     Is BP treated: No     HDL Cholesterol: 37.6 mg/dL     Total Cholesterol: 132 mg/dL  I would suggest that we try a low dose of cholesterol med to try and lower this risk.  Your thoughts?  PSA in normal range, recheck in one year

## 2018-12-23 ENCOUNTER — Other Ambulatory Visit: Payer: Self-pay

## 2018-12-23 ENCOUNTER — Encounter: Payer: Self-pay | Admitting: Family Medicine

## 2018-12-23 ENCOUNTER — Encounter: Payer: Self-pay | Admitting: Gastroenterology

## 2018-12-23 ENCOUNTER — Ambulatory Visit: Payer: BC Managed Care – PPO | Admitting: Family Medicine

## 2018-12-23 VITALS — BP 120/80 | HR 80 | Temp 97.6°F | Resp 16 | Ht 69.0 in | Wt 180.0 lb

## 2018-12-23 DIAGNOSIS — Z131 Encounter for screening for diabetes mellitus: Secondary | ICD-10-CM | POA: Diagnosis not present

## 2018-12-23 DIAGNOSIS — Z130 Encounter for screening for diseases of the blood and blood-forming organs and certain disorders involving the immune mechanism: Secondary | ICD-10-CM | POA: Diagnosis not present

## 2018-12-23 DIAGNOSIS — B360 Pityriasis versicolor: Secondary | ICD-10-CM

## 2018-12-23 DIAGNOSIS — E785 Hyperlipidemia, unspecified: Secondary | ICD-10-CM

## 2018-12-23 DIAGNOSIS — Z7689 Persons encountering health services in other specified circumstances: Secondary | ICD-10-CM

## 2018-12-23 DIAGNOSIS — K2100 Gastro-esophageal reflux disease with esophagitis, without bleeding: Secondary | ICD-10-CM

## 2018-12-23 DIAGNOSIS — Z23 Encounter for immunization: Secondary | ICD-10-CM

## 2018-12-23 DIAGNOSIS — Z125 Encounter for screening for malignant neoplasm of prostate: Secondary | ICD-10-CM | POA: Diagnosis not present

## 2018-12-23 DIAGNOSIS — Z13220 Encounter for screening for lipoid disorders: Secondary | ICD-10-CM

## 2018-12-23 DIAGNOSIS — G8929 Other chronic pain: Secondary | ICD-10-CM

## 2018-12-23 DIAGNOSIS — M25562 Pain in left knee: Secondary | ICD-10-CM

## 2018-12-23 DIAGNOSIS — Z950 Presence of cardiac pacemaker: Secondary | ICD-10-CM

## 2018-12-23 LAB — CBC
HCT: 47.7 % (ref 39.0–52.0)
Hemoglobin: 16.1 g/dL (ref 13.0–17.0)
MCHC: 33.7 g/dL (ref 30.0–36.0)
MCV: 95.5 fl (ref 78.0–100.0)
Platelets: 190 10*3/uL (ref 150.0–400.0)
RBC: 4.99 Mil/uL (ref 4.22–5.81)
RDW: 12.9 % (ref 11.5–15.5)
WBC: 7 10*3/uL (ref 4.0–10.5)

## 2018-12-23 LAB — HEMOGLOBIN A1C: Hgb A1c MFr Bld: 5.2 % (ref 4.6–6.5)

## 2018-12-23 LAB — COMPREHENSIVE METABOLIC PANEL
ALT: 16 U/L (ref 0–53)
AST: 17 U/L (ref 0–37)
Albumin: 4.3 g/dL (ref 3.5–5.2)
Alkaline Phosphatase: 48 U/L (ref 39–117)
BUN: 20 mg/dL (ref 6–23)
CO2: 31 mEq/L (ref 19–32)
Calcium: 9.2 mg/dL (ref 8.4–10.5)
Chloride: 103 mEq/L (ref 96–112)
Creatinine, Ser: 1.01 mg/dL (ref 0.40–1.50)
GFR: 73.46 mL/min (ref >60.00)
Glucose, Bld: 95 mg/dL (ref 70–99)
Potassium: 4.3 mEq/L (ref 3.5–5.1)
Sodium: 140 mEq/L (ref 135–145)
Total Bilirubin: 1.2 mg/dL (ref 0.2–1.2)
Total Protein: 7.1 g/dL (ref 6.0–8.3)

## 2018-12-23 LAB — PSA: PSA: 1.94 ng/mL (ref 0.10–4.00)

## 2018-12-23 LAB — LIPID PANEL
Cholesterol: 132 mg/dL (ref 0–200)
HDL: 37.6 mg/dL — ABNORMAL LOW (ref >39.00)
LDL Cholesterol: 83 mg/dL (ref 0–99)
NonHDL: 94.53
Total CHOL/HDL Ratio: 4
Triglycerides: 59 mg/dL (ref 0.0–149.0)
VLDL: 11.8 mg/dL (ref 0.0–40.0)

## 2018-12-23 NOTE — Patient Instructions (Addendum)
Great to meet you today- say hello to [facility] for me please!   I will be in touch with your labs asap via mychart  We put in referrals for you to see Dr Alvan Dame- you can call him and make an appt yourself if you like Also referred you back to [facility] to discuss your chronic reflux, and dermatology ([facility]) for your skin concerns  You got your flu shot and 1st pneumonia vaccine today- will give the 2nd pneumonia shot in one year

## 2018-12-27 MED ORDER — SIMVASTATIN 10 MG PO TABS: 10.0000 mg | ORAL_TABLET | Freq: Every day | ORAL | 3 refills | Status: DC

## 2019-01-25 ENCOUNTER — Ambulatory Visit: Payer: BC Managed Care – PPO | Admitting: Gastroenterology

## 2019-01-26 ENCOUNTER — Encounter: Payer: Self-pay | Admitting: Gastroenterology

## 2019-01-26 ENCOUNTER — Other Ambulatory Visit: Payer: Self-pay

## 2019-01-26 ENCOUNTER — Ambulatory Visit: Payer: BC Managed Care – PPO | Admitting: Gastroenterology

## 2019-01-26 ENCOUNTER — Ambulatory Visit (INDEPENDENT_AMBULATORY_CARE_PROVIDER_SITE_OTHER): Payer: BC Managed Care – PPO | Admitting: Gastroenterology

## 2019-01-26 VITALS — BP 142/80 | HR 74 | Ht 69.0 in | Wt 181.0 lb

## 2019-01-26 DIAGNOSIS — K219 Gastro-esophageal reflux disease without esophagitis: Secondary | ICD-10-CM | POA: Diagnosis not present

## 2019-01-26 DIAGNOSIS — R05 Cough: Secondary | ICD-10-CM

## 2019-01-26 DIAGNOSIS — R053 Chronic cough: Secondary | ICD-10-CM

## 2019-01-26 MED ORDER — PANTOPRAZOLE SODIUM 40 MG PO TBEC: 40.0000 mg | DELAYED_RELEASE_TABLET | Freq: Two times a day (BID) | ORAL | 0 refills | Status: DC

## 2019-01-26 MED ORDER — PANTOPRAZOLE SODIUM 40 MG PO TBEC: 40.0000 mg | DELAYED_RELEASE_TABLET | Freq: Every day | ORAL | 3 refills | Status: DC

## 2019-01-26 NOTE — Progress Notes (Signed)
Chief Complaint: GERD, chronic cough  Referring Provider:     Darreld Mclean, MD   HPI:    William Curry is a 68 y.o. male with a history of hypertension, AV block s/p pacemaker 2013, GERD, referred to the Gastroenterology Clinic for evaluation of chronic cough and suspected reflux symptoms.  He states he has had a longstanding history of reflux for 20 years or so, characterized as dry cough predominantly, with occasional regurgitation and dyspepsia. Sxs improve with Prilosec as below. No dysphagia.  Rare globus. No nocturnal sxs. No inciting foods.   Initially treated with Tums with improvement. Sxs worsened 10 years ago and started OTC Prilosec and Zantac on/off, again responsive to therapy.  More symptoms in the last 2 years. Will take Prilsoec 20 mg x14-20 days, then stop for 14 days, and resume again. Currently taking Prilosec 20 mg QAM.   Normal CBC and CMP in 12/2010.  No previous EGD.  Endoscopic history: -Colonoscopy (2014, Dr. Sharlett Iles): Normal.  Repeat in 10 years  MGF with CRC in 1960's. No other known family history of CRC, GI malignancy, liver disease, pancreatic disease, or IBD.     Past Medical History:  Diagnosis Date  . DDD (degenerative disc disease), lumbar   . GERD (gastroesophageal reflux disease)   . 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 History of chicken pox   . Hypertension 2002-2005  . Kidney stone 1994-2010   Passed stone in 1994, Lithotripsy in 2010, stones passed in 2018  . Macular degeneration   . Scoliosis   . Tinea versicolor      Past Surgical History:  Procedure Laterality Date  . CARTILAGE REPAIR  Nov.-1989   Left knee-HARD TO WAKE UP AFTER SX  . KNEE ARTHROSCOPY WITH ANTERIOR CRUCIATE LIGAMENT (ACL) REPAIR  [phone]   Left knee-HARD TO WAKE UP AFTER SX  . LITHOTRIPSY  Jun-2010   Kidney stone (1st stone 1994 - Passed)  . PACEMAKER INSTALLATION  Jan-2013   Complete Heart Block   Family History  Problem  Relation Age of Onset  . Cancer Mother 74       Breast  . Diabetes Mother 39       on insulin  . Diabetes Father 75       on insulin  . Bladder Cancer Father   . Colon cancer Maternal Grandfather   . Esophageal cancer Neg Hx   . Rectal cancer Neg Hx   . Stomach cancer Neg Hx   . Prostate cancer Neg Hx    Social History   Tobacco Use  . Smoking status: Never Smoker  . Smokeless tobacco: Never Used  Substance Use Topics  . Alcohol use: Yes    Alcohol/week: 6.0 standard drinks    Types: 6 Cans of beer per week    Comment: 4 - 6 beers a week  . Drug use: No   Current Outpatient Medications  Medication Sig Dispense Refill  . Multiple Vitamin (MULTIVITAMIN) tablet Take 1 tablet by mouth daily.    . Multiple Vitamins-Minerals (PRESERVISION AREDS 2+MULTI VIT PO) Take 1 tablet 2 (two) times daily by mouth.    . sildenafil (REVATIO) 20 MG tablet TAKE 3 TO 5 TABLETS BY MOUTH ONCE DAILY AS NEEDED 50 tablet 12  . simvastatin (ZOCOR) 10 MG tablet Take 1 tablet (10 mg total) by mouth at bedtime. 90 tablet 3  . terbinafine (LAMISIL) 1 %  cream Apply topically 2 (two) times daily as needed.     . pantoprazole (PROTONIX) 40 MG tablet Take 1 tablet (40 mg total) by mouth 2 (two) times daily for 28 days. Then decrease to once daily. 56 tablet 0  . [START ON 02/23/2019] pantoprazole (PROTONIX) 40 MG tablet Take 1 tablet (40 mg total) by mouth daily. 90 tablet 3   No current facility-administered medications for this visit.    Allergies  Allergen Reactions  . Penicillins Other (See Comments)    As a child     Review of Systems: All systems reviewed and negative except where noted in HPI.     Physical Exam:    Wt Readings from Last 3 Encounters:  01/26/19 181 lb (82.1 kg)  12/23/18 180 lb (81.6 kg)  08/09/18 180 lb (81.6 kg)    BP (!) 142/80   Pulse 74   Ht 5\' 9"  (1.753 m)   Wt 181 lb (82.1 kg)   BMI 26.73 kg/m  Constitutional:  Pleasant, in no acute distress. Psychiatric:  Normal mood and affect. Behavior is normal. EENT: Pupils normal.  Conjunctivae are normal. No scleral icterus. Neck supple. No cervical LAD. Cardiovascular: Normal rate, regular rhythm. No edema Pulmonary/chest: Effort normal and breath sounds normal. No wheezing, rales or rhonchi. Abdominal: Soft, nondistended, nontender. Bowel sounds active throughout. There are no masses palpable. No hepatomegaly. Neurological: Alert and oriented to person place and time. Skin: Skin is warm and dry. No rashes noted.   ASSESSMENT AND PLAN;   1) Regurgitation/GERD 2) Chronic cough  -Discussed typical/atypical manifestations of reflux at length today - Protonix 40 mg p.o. twice daily x4 weeks for diagnostic/therapeutic intent, with plan to titrate to lowest effective dose if efficacious -EGD to evaluate for Barrett's Esophagus, along with erosive esophagitis, LES laxity, hiatal hernia - Resume antireflux lifestyle modifications -Can stop Prilosec while using Protonix - If no response to high-dose PPI trial and EGD otherwise unrevealing, can consider pH/impedance testing  The indications, risks, and benefits of EGD were explained to the patient in detail. Risks include but are not limited to bleeding, perforation, adverse reaction to medications, and cardiopulmonary compromise. Sequelae include but are not limited to the possibility of surgery, hositalization, and mortality. The patient verbalized understanding and wished to proceed. All questions answered, referred to scheduler. Further recommendations pending results of the exam.     Lavena Bullion, DO, FACG  01/26/2019, 4:48 PM   Cop[place], Gay Filler, MD

## 2019-01-26 NOTE — Patient Instructions (Addendum)
If you are age 68 or older, your body mass index should be between 23-30. Your Body mass index is 26.73 kg/m. If this is out of the aforementioned range listed, please consider follow up with your Primary Care Provider.  If you are age 33 or younger, your body mass index should be between 19-25. Your Body mass index is 26.73 kg/m. If this is out of the aformentioned range listed, please consider follow up with your Primary Care Provider.    To help prevent the possible spread of infection to our patients, communities, and staff; we will be implementing the following measures:  As of now we are not allowing any visitors/family members to accompany you to any upcoming appointments with [facility]. If you have any concerns about this please contact our office to discuss prior to the appointment.   It has been recommended to you by your physician that you have a(n) EGD completed. Per your request, we did not schedule the procedure(s) today. Please contact our office at [phone] should you decide to have the procedure completed. You will be scheduled for a pre-visit and procedure at that time.  Stop taking your Prilosec  We have sent the following medications to your pharmacy for you to pick up at your convenience: Protonix 40mg  twice daily for 4 weeks the decrease to daily.  It was a pleasure to see you today!  Vito Cirigliano, D.O.

## 2019-01-31 ENCOUNTER — Encounter: Payer: Self-pay | Admitting: Gastroenterology

## 2019-02-02 ENCOUNTER — Ambulatory Visit (INDEPENDENT_AMBULATORY_CARE_PROVIDER_SITE_OTHER): Payer: BC Managed Care – PPO | Admitting: *Deleted

## 2019-02-02 DIAGNOSIS — I442 Atrioventricular block, complete: Secondary | ICD-10-CM | POA: Diagnosis not present

## 2019-02-02 DIAGNOSIS — I447 Left bundle-branch block, unspecified: Secondary | ICD-10-CM

## 2019-02-02 LAB — CUP PACEART REMOTE DEVICE CHECK
Battery Remaining Longevity: 49 mo
Battery Remaining Percentage: 40 %
Battery Voltage: 2.84 V
Brady Statistic AP VP Percent: 13 %
Brady Statistic AP VS Percent: 5.9 %
Brady Statistic AS VP Percent: 68 %
Brady Statistic AS VS Percent: 13 %
Brady Statistic RA Percent Paced: 18 %
Brady Statistic RV Percent Paced: 81 %
Date Time Interrogation Session: 20201014122406
Implantable Lead Implant Date: 20130115
Implantable Lead Implant Date: 20130115
Implantable Lead Location: 753859
Implantable Lead Location: 753860
Implantable Pulse Generator Implant Date: 20130115
Lead Channel Impedance Value: 390 Ohm
Lead Channel Impedance Value: 410 Ohm
Lead Channel Pacing Threshold Amplitude: 0.375 V
Lead Channel Pacing Threshold Amplitude: 1.375 V
Lead Channel Pacing Threshold Pulse Width: 0.4 ms
Lead Channel Pacing Threshold Pulse Width: 0.4 ms
Lead Channel Sensing Intrinsic Amplitude: 2 mV
Lead Channel Sensing Intrinsic Amplitude: 3.6 mV
Lead Channel Setting Pacing Amplitude: 1.375
Lead Channel Setting Pacing Amplitude: 1.625
Lead Channel Setting Pacing Pulse Width: 0.4 ms
Lead Channel Setting Sensing Sensitivity: 0.5 mV
Pulse Gen Model: 2210
Pulse Gen Serial Number: 7304172

## 2019-02-11 ENCOUNTER — Other Ambulatory Visit: Payer: Self-pay

## 2019-02-11 ENCOUNTER — Encounter: Payer: Self-pay | Admitting: Gastroenterology

## 2019-02-11 ENCOUNTER — Ambulatory Visit (AMBULATORY_SURGERY_CENTER): Payer: Self-pay | Admitting: *Deleted

## 2019-02-11 VITALS — Temp 97.1°F | Ht 69.0 in | Wt 181.0 lb

## 2019-02-11 DIAGNOSIS — R059 Cough, unspecified: Secondary | ICD-10-CM

## 2019-02-11 DIAGNOSIS — Z1159 Encounter for screening for other viral diseases: Secondary | ICD-10-CM

## 2019-02-11 DIAGNOSIS — R05 Cough: Secondary | ICD-10-CM

## 2019-02-11 DIAGNOSIS — K2100 Gastro-esophageal reflux disease with esophagitis, without bleeding: Secondary | ICD-10-CM

## 2019-02-11 NOTE — Progress Notes (Signed)
No egg or soy allergy known to patient  No issues with past sedation with any surgeries  or procedures,- pt states he is slow to wake post op - no intubation problems  No diet pills per patient No home 02 use per patient  No blood thinners per patient  Pt denies issues with constipation  No A fib or A flutter  EMMI video sent to pt's e mail  Covid test 10-30 FRI at 915 am   Due to the COVID-19 pandemic we are asking patients to follow these guidelines. Please only bring one care partner. Please be aware that your care partner may wait in the car in the parking lot or if they feel like they will be too hot to wait in the car, they may wait in the lobby on the 4th floor. All care partners are required to wear a mask the entire time (we do not have any that we can provide them), they need to practice social distancing, and we will do a Covid check for all patient's and care partners when you arrive. Also we will check their temperature and your temperature. If the care partner waits in their car they need to stay in the parking lot the entire time and we will call them on their cell phone when the patient is ready for discharge so they can bring the car to the front of the building. Also all patient's will need to wear a mask into building.

## 2019-02-15 NOTE — Progress Notes (Signed)
Remote pacemaker transmission.   

## 2019-02-16 ENCOUNTER — Other Ambulatory Visit: Payer: Self-pay | Admitting: Gastroenterology

## 2019-02-18 ENCOUNTER — Other Ambulatory Visit: Payer: Self-pay | Admitting: Gastroenterology

## 2019-02-18 LAB — SARS CORONAVIRUS 2 (TAT 6-24 HRS): SARS Coronavirus 2: NEGATIVE

## 2019-02-23 ENCOUNTER — Encounter: Payer: Self-pay | Admitting: Gastroenterology

## 2019-02-23 ENCOUNTER — Ambulatory Visit (AMBULATORY_SURGERY_CENTER): Payer: BC Managed Care – PPO | Admitting: Gastroenterology

## 2019-02-23 ENCOUNTER — Other Ambulatory Visit: Payer: Self-pay

## 2019-02-23 VITALS — BP 110/72 | HR 60 | Temp 98.6°F | Resp 16 | Ht 69.0 in | Wt 181.0 lb

## 2019-02-23 DIAGNOSIS — K317 Polyp of stomach and duodenum: Secondary | ICD-10-CM

## 2019-02-23 DIAGNOSIS — K219 Gastro-esophageal reflux disease without esophagitis: Secondary | ICD-10-CM

## 2019-02-23 DIAGNOSIS — B3781 Candidal esophagitis: Secondary | ICD-10-CM

## 2019-02-23 DIAGNOSIS — K2970 Gastritis, unspecified, without bleeding: Secondary | ICD-10-CM | POA: Diagnosis not present

## 2019-02-23 DIAGNOSIS — K2990 Gastroduodenitis, unspecified, without bleeding: Secondary | ICD-10-CM

## 2019-02-23 MED ORDER — SODIUM CHLORIDE 0.9 % IV SOLN: 500.0000 mL | INTRAVENOUS | Status: DC

## 2019-02-23 MED ORDER — FLUCONAZOLE 100 MG PO TABS: 100.0000 mg | ORAL_TABLET | Freq: Every day | ORAL | 0 refills | Status: DC

## 2019-02-23 NOTE — Progress Notes (Signed)
Pt tolerated well. Teeth unchanged. VSS. Arousable to recovery.

## 2019-02-23 NOTE — Op Note (Signed)
[facility] Patient Name: William Curry Procedure Date: 02/23/2019 9:26 AM MRN: [id number] Endoscopist: Gerrit Heck , MD Age: 68 Referring MD:  Date of Birth: 1951-03-31 Gender: Male Account #: [id number] Procedure:                Upper GI endoscopy Indications:              Esophageal reflux, Screening for Barrett's                            esophagus, Chronic cough                           Reflux symptoms well controlled on current high                            dose PPI therapy, but with ongoing chronic cough. Medicines:                Monitored Anesthesia Care Procedure:                Pre-Anesthesia Assessment:                           - Prior to the procedure, a History and Physical                            was performed, and patient medications and                            allergies were reviewed. The patient's tolerance of                            previous anesthesia was also reviewed. The risks                            and benefits of the procedure and the sedation                            options and risks were discussed with the patient.                            All questions were answered, and informed consent                            was obtained. Prior Anticoagulants: The patient has                            taken no previous anticoagulant or antiplatelet                            agents. ASA Grade Assessment: III - A patient with                            severe systemic disease. After reviewing the risks  and benefits, the patient was deemed in                            satisfactory condition to undergo the procedure.                           After obtaining informed consent, the endoscope was                            passed under direct vision. Throughout the                            procedure, the patient's blood pressure, pulse, and                            oxygen saturations were monitored  continuously. The                            Endoscope was introduced through the mouth, and                            advanced to the second part of duodenum. The upper                            GI endoscopy was accomplished without difficulty.                            The patient tolerated the procedure well. Scope In: Scope Out: Findings:                 Diffuse white plaques were found in the upper third                            of the esophagus and in the middle third of the                            esophagus. Biopsies were taken with a cold forceps                            for histology. Estimated blood loss was minimal.                           The Z-line was regular and was found 40 cm from the                            incisors.                           Esophagogastric landmarks were identified: the                            Z-line was found at 40 cm, the gastroesophageal  junction was found at 40 cm and the site of hiatal                            narrowing was found at 40 cm from the incisors.                           Multiple 2 to 6 mm sessile polyps with no bleeding                            and no stigmata of recent bleeding were found in                            the gastric fundus and in the gastric body. Several                            of these polyps were removed with a cold biopsy                            forceps. Resection and retrieval were complete.                            Estimated blood loss was minimal.                           Localized moderate inflammation characterized by                            congestion (edema) and erythema was found in the                            gastric body. Biopsies were taken with a cold                            forceps for Helicobacter pylori testing. Estimated                            blood loss was minimal.                           The incisura, gastric antrum and  pylorus were                            normal.                           The duodenal bulb, first portion of the duodenum                            and second portion of the duodenum were normal. Complications:            No immediate complications. Estimated Blood Loss:     Estimated blood loss was minimal. Impression:               - Esophageal plaques were found, suspicious  for                            candidiasis. Biopsied.                           - Z-line regular, 40 cm from the incisors.                           - Esophagogastric landmarks identified.                           - Multiple gastric polyps. Resected and retrieved.                           - Localized gastritis. Biopsied.                           - Normal incisura, antrum and pylorus.                           - Normal duodenal bulb, first portion of the                            duodenum and second portion of the duodenum. Recommendation:           - Patient has a contact number available for                            emergencies. The signs and symptoms of potential                            delayed complications were discussed with the                            patient. Return to normal activities tomorrow.                            Written discharge instructions were provided to the                            patient.                           - Resume previous diet.                           - Continue present medications.                           - Await pathology results.                           - Diflucan (fluconazole) 200 mg x1 then 100 mg PO                            daily for 3 weeks.                           -  Return to GI clinic at appointment to be                            scheduled. Gerrit Heck, MD 02/23/2019 10:05:00 AM

## 2019-02-23 NOTE — Patient Instructions (Signed)
Handout given for gastritis  Pick up Rx for fluconazole.  YOU HAD AN ENDOSCOPIC PROCEDURE TODAY AT [facility]:   Refer to the procedure report that was given to you for any specific questions about what was found during the examination.  If the procedure report does not answer your questions, please call your gastroenterologist to clarify.  If you requested that your care partner not be given the details of your procedure findings, then the procedure report has been included in a sealed envelope for you to review at your convenience later.  YOU SHOULD EXPECT: Some feelings of bloating in the abdomen. Passage of more gas than usual.  Walking can help get rid of the air that was put into your GI tract during the procedure and reduce the bloating. If you had a lower endoscopy (such as a colonoscopy or flexible sigmoidoscopy) you may notice spotting of blood in your stool or on the toilet paper. If you underwent a bowel prep for your procedure, you may not have a normal bowel movement for a few days.  Please Note:  You might notice some irritation and congestion in your nose or some drainage.  This is from the oxygen used during your procedure.  There is no need for concern and it should clear up in a day or so.  SYMPTOMS TO REPORT IMMEDIATELY:   Following upper endoscopy (EGD)  Vomiting of blood or coffee ground material  New chest pain or pain under the shoulder blades  Painful or persistently difficult swallowing  New shortness of breath  Fever of 100F or higher  Black, tarry-looking stools  For urgent or emergent issues, a gastroenterologist can be reached at any hour by calling [phone].   DIET:  We do recommend a small meal at first, but then you may proceed to your regular diet.  Drink plenty of fluids but you should avoid alcoholic beverages for 24 hours.  ACTIVITY:  You should plan to take it easy for the rest of today and you should NOT DRIVE or use heavy  machinery until tomorrow (because of the sedation medicines used during the test).    FOLLOW UP: Our staff will call the number listed on your records 48-72 hours following your procedure to check on you and address any questions or concerns that you may have regarding the information given to you following your procedure. If we do not reach you, we will leave a message.  We will attempt to reach you two times.  During this call, we will ask if you have developed any symptoms of COVID 19. If you develop any symptoms (ie: fever, flu-like symptoms, shortness of breath, cough etc.) before then, please call [phone].  If you test positive for Covid 19 in the 2 weeks post procedure, please call and report this information to [place].    If any biopsies were taken you will be contacted by phone or by letter within the next 1-3 weeks.  Please call us at [phone] if you have not heard about the biopsies in 3 weeks.    SIGNATURES/CONFIDENTIALITY: You and/or your care partner have signed paperwork which will be entered into your electronic medical record.  These signatures attest to the fact that that the information above on your After Visit Summary has been reviewed and is understood.  Full responsibility of the confidentiality of this discharge information lies with you and/or your care-partner.

## 2019-02-23 NOTE — Progress Notes (Signed)
Called to room to assist during endoscopic procedure.  Patient ID and intended procedure confirmed with present staff. Received instructions for my participation in the procedure from the performing physician.  

## 2019-02-25 ENCOUNTER — Other Ambulatory Visit: Payer: Self-pay | Admitting: Family Medicine

## 2019-02-25 ENCOUNTER — Other Ambulatory Visit: Payer: Self-pay | Admitting: *Deleted

## 2019-02-25 ENCOUNTER — Telehealth: Payer: Self-pay | Admitting: *Deleted

## 2019-02-25 NOTE — Telephone Encounter (Signed)
This request came in this morning electronically and it looks like the PCP is now Lamar Blinks, MD.  Has the patient transferred? Faxed refill request. Sildenafil Last office visit:   05/04/2018 Last Filled:    50 tablet 6 02/25/2019  Please advise.   This was sent to Dr. Janett Billow Copland this morning.

## 2019-02-25 NOTE — Telephone Encounter (Signed)
Electronic refill request. Sildenafil Last office visit:   12/23/2018 Last Filled:    50 tablet 12 07/29/2017  Please advise.

## 2019-02-25 NOTE — Telephone Encounter (Signed)
  Follow up Call-  Call back number 02/23/2019  Post procedure Call Back phone  # [phone]  Permission to leave phone message Yes  Some recent data might be hidden    1. Have you developed a fever since your procedure? No   2.   Have you had an respiratory symptoms (SOB or cough) since your procedure? no  3.   Have you tested positive for COVID 19 since your procedure no  4.   Have you had any family members/close contacts diagnosed with the COVID 19 since your procedure?  no   If yes to any of these questions please route to Joylene John, RN and Alphonsa Gin, Therapist, sports.  Patient questions:  Do you have a fever, pain , or abdominal swelling? No. Pain Score  0 *  Have you tolerated food without any problems? Yes.    Have you been able to return to your normal activities? Yes.    Do you have any questions about your discharge instructions: Diet   No. Medications  No. Follow up visit  No.  Do you have questions or concerns about your Care? No.  Actions: * If pain score is 4 or above: No action needed, pain <4.

## 2019-02-27 NOTE — Telephone Encounter (Signed)
He transferred over.  I routed this in the meantime.  Thanks.

## 2019-02-28 MED ORDER — SILDENAFIL CITRATE 20 MG PO TABS: ORAL_TABLET | ORAL | 6 refills | Status: DC

## 2019-03-22 ENCOUNTER — Encounter: Payer: Self-pay | Admitting: Gastroenterology

## 2019-03-22 ENCOUNTER — Other Ambulatory Visit: Payer: Self-pay

## 2019-03-22 ENCOUNTER — Ambulatory Visit (INDEPENDENT_AMBULATORY_CARE_PROVIDER_SITE_OTHER): Payer: BC Managed Care – PPO | Admitting: Gastroenterology

## 2019-03-22 VITALS — BP 118/72 | HR 78 | Temp 98.2°F | Ht 69.0 in | Wt 183.5 lb

## 2019-03-22 DIAGNOSIS — B3781 Candidal esophagitis: Secondary | ICD-10-CM

## 2019-03-22 DIAGNOSIS — K219 Gastro-esophageal reflux disease without esophagitis: Secondary | ICD-10-CM

## 2019-03-22 DIAGNOSIS — R05 Cough: Secondary | ICD-10-CM

## 2019-03-22 DIAGNOSIS — R053 Chronic cough: Secondary | ICD-10-CM

## 2019-03-22 NOTE — Progress Notes (Signed)
P  Chief Complaint:    GERD, chronic cough  GI History: William Curry is a 68 y.o. male with a history of hypertension, AV block s/p pacemaker 2013, follows in the GI clinic for reflux.  Longstanding history of reflux for 20 years or so, characterized as dry cough predominantly, with occasional regurgitation and dyspepsia.  No dysphagia.  Rare globus. No nocturnal sxs. No inciting foods.  Symptoms started increasing in frequency/severity approximately 2018.  Initially treated with Tums with improvement. Sxs worsened 10 years ago and started OTC Prilosec and Zantac on/off, again responsive to therapy.  Started on Protonix 40 mg bid x4 weeks, with good clinical response, and titrated to 40 mg/day with continued response.  Endoscopic history: -EGD (02/2019, Dr. Bryan Lemma): Candida esophagus, normal Z-line, mild gastritis, hyperplastic gastric polyps -Colonoscopy (2014, Dr. Sharlett Iles): Normal.  Repeat in 10 years   HPI:    Patient is a 68 y.o. male presenting to the Gastroenterology Clinic for follow-up.  States that he is doing well, and typical reflux sxs (HB, regurgitation) well controlled. Cough only mildly improved, but no worse when titrating from Protonix 40 mg bid to daily.  EGD completed last month notable for Candida esophagitis, otherwise normal Z-line/GE J, mild non-H. pylori gastritis and benign gastric hyperplastic polyps. Completed fluconazole as prescribed.  He is otherwise without any complaints.   Review of systems:     No chest pain, no SOB, no fevers, no urinary sx   Past Medical History:  Diagnosis Date  . DDD (degenerative disc disease), lumbar   . GERD (gastroesophageal reflux disease)   . 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 History of chicken pox   . Hypertension 2002-2005  . Kidney stone 1994-2010   Passed stone in 1994, Lithotripsy in 2010, stones passed in 2018  . Macular degeneration   . Scoliosis   . Tinea versicolor     Patient's  surgical history, family medical history, social history, medications and allergies were all reviewed in Epic    Current Outpatient Medications  Medication Sig Dispense Refill  . ketoconazole (NIZORAL) 2 % shampoo 2 (two) times a week.     . Multiple Vitamin (MULTIVITAMIN) tablet Take 1 tablet by mouth daily.    . Multiple Vitamins-Minerals (PRESERVISION AREDS 2+MULTI VIT PO) Take 1 tablet 2 (two) times daily by mouth.    Marland Kitchen OVER THE COUNTER MEDICATION CBD  Oil daily    . pantoprazole (PROTONIX) 40 MG tablet Take 1 tablet (40 mg total) by mouth daily. 90 tablet 3  . sildenafil (REVATIO) 20 MG tablet TAKE 3 TO 5 TABLETS BY MOUTH ONCE DAILY AS NEEDED 50 tablet 6  . simvastatin (ZOCOR) 10 MG tablet Take 1 tablet (10 mg total) by mouth at bedtime. 90 tablet 3  . terbinafine (LAMISIL) 1 % cream Apply topically 2 (two) times daily as needed.     Marland Kitchen ketoconazole (NIZORAL) 200 MG tablet Take 200 mg by mouth as needed.      No current facility-administered medications for this visit.     Physical Exam:     BP 118/72   Pulse 78   Temp 98.2 F (36.8 C)   Ht 5\' 9"  (1.753 m)   Wt 183 lb 8 oz (83.2 kg)   BMI 27.10 kg/m   GENERAL:  Pleasant male in NAD PSYCH: : Cooperative, normal affect EENT:  conjunctiva pink, mucous membranes moist, neck supple without masses CARDIAC:  RRR, no murmur heard, no peripheral  edema PULM: Normal respiratory effort, lungs CTA bilaterally, no wheezing ABDOMEN:  Nondistended, soft, nontender. No obvious masses, no hepatomegaly,  normal bowel sounds SKIN:  turgor, no lesions seen Musculoskeletal:  Normal muscle tone, normal strength NEURO: Alert and oriented x 3, no focal neurologic deficits   IMPRESSION and PLAN:    1) GERD without erosive esophagitis 2) Chronic cough  -Good response to trial of high-dose PPI therapy with regards to typical reflux symptoms, with only mild improvement in chronic cough.  Discussed medical management of typical vs atypical reflux  symptoms at length today.  Discussed options to include 1) continued medical management and observe for clinical improvement, 2) pH/impedance testing (on PPI) to evaluate for breakthrough reflux as cause for ongoing cough, 3) Pulmonary referral.  He would like to proceed with option 1 -Resume Protonix 40 mg prebreakfast -Resume antireflux lifestyle/dietary modifications -RTC in 3 months or sooner as needed  3) Candida esophagitis: -Completed fluconazole  I spent a total of 15 minutes of face-to-face time with the patient. Greater than 50% of the time was spent counseling and coordinating care.       [place] ,DO, FACG 03/22/2019, 8:59 AM

## 2019-03-22 NOTE — Patient Instructions (Signed)
If you are age 68 or older, your body mass index should be between 23-30. Your Body mass index is 27.1 kg/m. If this is out of the aforementioned range listed, please consider follow up with your Primary Care Provider.  If you are age 13 or younger, your body mass index should be between 19-25. Your Body mass index is 27.1 kg/m. If this is out of the aformentioned range listed, please consider follow up with your Primary Care Provider.    Return to office in 3 months  It was a pleasure to see you today!  Vito Cirigliano, D.O.

## 2019-05-04 ENCOUNTER — Ambulatory Visit (INDEPENDENT_AMBULATORY_CARE_PROVIDER_SITE_OTHER): Payer: BC Managed Care – PPO | Admitting: *Deleted

## 2019-05-04 DIAGNOSIS — I442 Atrioventricular block, complete: Secondary | ICD-10-CM | POA: Diagnosis not present

## 2019-05-04 LAB — CUP PACEART REMOTE DEVICE CHECK
Battery Remaining Longevity: 42 mo
Battery Remaining Percentage: 35 %
Battery Voltage: 2.83 V
Brady Statistic AP VP Percent: 12 %
Brady Statistic AP VS Percent: 6 %
Brady Statistic AS VP Percent: 69 %
Brady Statistic AS VS Percent: 13 %
Brady Statistic RA Percent Paced: 18 %
Brady Statistic RV Percent Paced: 81 %
Date Time Interrogation Session: 20210113054336
Implantable Lead Implant Date: 20130115
Implantable Lead Implant Date: 20130115
Implantable Lead Location: 753859
Implantable Lead Location: 753860
Implantable Pulse Generator Implant Date: 20130115
Lead Channel Impedance Value: 400 Ohm
Lead Channel Impedance Value: 480 Ohm
Lead Channel Pacing Threshold Amplitude: 0.375 V
Lead Channel Pacing Threshold Amplitude: 1.625 V
Lead Channel Pacing Threshold Pulse Width: 0.4 ms
Lead Channel Pacing Threshold Pulse Width: 0.4 ms
Lead Channel Sensing Intrinsic Amplitude: 2 mV
Lead Channel Sensing Intrinsic Amplitude: 4.7 mV
Lead Channel Setting Pacing Amplitude: 1.375
Lead Channel Setting Pacing Amplitude: 1.875
Lead Channel Setting Pacing Pulse Width: 0.4 ms
Lead Channel Setting Sensing Sensitivity: 0.5 mV
Pulse Gen Model: 2210
Pulse Gen Serial Number: 7304172

## 2019-05-30 ENCOUNTER — Ambulatory Visit: Payer: BC Managed Care – PPO

## 2019-08-03 ENCOUNTER — Ambulatory Visit (INDEPENDENT_AMBULATORY_CARE_PROVIDER_SITE_OTHER): Payer: BC Managed Care – PPO | Admitting: *Deleted

## 2019-08-03 DIAGNOSIS — I442 Atrioventricular block, complete: Secondary | ICD-10-CM

## 2019-08-03 LAB — CUP PACEART REMOTE DEVICE CHECK
Battery Remaining Longevity: 31 mo
Battery Remaining Percentage: 25 %
Battery Voltage: 2.8 V
Brady Statistic AP VP Percent: 12 %
Brady Statistic AP VS Percent: 6.2 %
Brady Statistic AS VP Percent: 68 %
Brady Statistic AS VS Percent: 14 %
Brady Statistic RA Percent Paced: 18 %
Brady Statistic RV Percent Paced: 80 %
Date Time Interrogation Session: 20210414083938
Implantable Lead Implant Date: 20130115
Implantable Lead Implant Date: 20130115
Implantable Lead Location: 753859
Implantable Lead Location: 753860
Implantable Pulse Generator Implant Date: 20130115
Lead Channel Impedance Value: 400 Ohm
Lead Channel Impedance Value: 450 Ohm
Lead Channel Pacing Threshold Amplitude: 0.375 V
Lead Channel Pacing Threshold Amplitude: 1.625 V
Lead Channel Pacing Threshold Pulse Width: 0.4 ms
Lead Channel Pacing Threshold Pulse Width: 0.4 ms
Lead Channel Sensing Intrinsic Amplitude: 2.2 mV
Lead Channel Sensing Intrinsic Amplitude: 3.3 mV
Lead Channel Setting Pacing Amplitude: 1.375
Lead Channel Setting Pacing Amplitude: 1.875
Lead Channel Setting Pacing Pulse Width: 0.4 ms
Lead Channel Setting Sensing Sensitivity: 0.5 mV
Pulse Gen Model: 2210
Pulse Gen Serial Number: 7304172

## 2019-08-03 NOTE — Progress Notes (Signed)
PPM Remote  

## 2019-08-07 NOTE — Progress Notes (Addendum)
[place] at [facility] [address], [place], [place] [address] [phone]  Date:  08/10/2019   Name:  William Curry   DOB:  10/16/1950   MRN:  [id number]  PCP:  Darreld Mclean, MD    Chief Complaint: Hyperlipidemia and Skin Lesion   History of Present Illness:  William Curry is a 69 y.o. very pleasant male patient who presents with the following:  Patient with history of complete AV block with pacemaker, ED, GERD, hypertension, macular degeneration, chronic tinea versicolor He transferred to me last year, I also take care of his wife William Curry He is a retired Gaffer, has 4 children and 6 grandchildren in [place] and one stepson/2 grandchildren locally Here today for a follow-up visit and to recheck cholesterol Last seen by myself in September At that time his 10-year ASCVD risk was almost 12%, we started him on simvastatin 10 mg He noted shoulder pain and upper arm pain with taking simvastatin.  He has continued to use it and has been using topical pain relievers.    His father also had a similar shoulder problem- however he is not sure if this was related at all   He has developed a skin nodule on his right inner knee- present for just about one week He has tried peroxide on it It is a bit tender if he presses on it  He is not aware of any apparent cause  He is also seeing Dr. Bryan Lemma for reflux which has caused cough-he is taking Protonix  Covid vaccine- complete  Never smoker Moderate alcohol  He has an appt to see his Derm in about 3 weeks already    Patient Active Problem List   Diagnosis Date Noted  . Knee pain 03/02/2017  . Insomnia 06/11/2015  . Erectile dysfunction 06/11/2015  . GERD (gastroesophageal reflux disease) 05/17/2014  . 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 Elevated blood pressure 06/13/2013  . Pacemaker-[place] 05/04/2012  . LBBB (left bundle branch block)  04/02/2012  . Tinea versicolor 04/02/2012  . DDD (degenerative disc disease), lumbar 04/02/2012    Past Medical History:  Diagnosis Date  . DDD (degenerative disc disease), lumbar   . GERD (gastroesophageal reflux disease)   . 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 History of chicken pox   . Hypertension 2002-2005  . Kidney stone 1994-2010   Passed stone in 1994, Lithotripsy in 2010, stones passed in 2018  . Macular degeneration   . Scoliosis   . Tinea versicolor     Past Surgical History:  Procedure Laterality Date  . CARTILAGE REPAIR  Nov.-1989   Left knee-HARD TO WAKE UP AFTER SX  . COLONOSCOPY    . KNEE ARTHROSCOPY WITH ANTERIOR CRUCIATE LIGAMENT (ACL) REPAIR  [phone]   Left knee-HARD TO WAKE UP AFTER SX  . LITHOTRIPSY  Jun-2010   Kidney stone (1st stone 1994 - Passed)  . PACEMAKER INSTALLATION  Jan-2013   Complete Heart Block    Social History   Tobacco Use  . Smoking status: Never Smoker  . Smokeless tobacco: Never Used  Substance Use Topics  . Alcohol use: Yes    Alcohol/week: 6.0 standard drinks    Types: 6 Cans of beer per week    Comment: 4 - 6 beers a week  . Drug use: No    Family History  Problem Relation Age of Onset  . Cancer Mother 61  Breast  . Diabetes Mother 11       on insulin  . Breast cancer Mother   . Diabetes Father 75       on insulin  . Bladder Cancer Father   . Colon cancer Maternal Grandfather   . Colon polyps Sister   . Esophageal cancer Neg Hx   . Rectal cancer Neg Hx   . Stomach cancer Neg Hx   . Prostate cancer Neg Hx     Allergies  Allergen Reactions  . Penicillins Other (See Comments)    As a child    Medication list has been reviewed and updated.  Current Outpatient Medications on File Prior to Visit  Medication Sig Dispense Refill  . ketoconazole (NIZORAL) 200 MG tablet Take 200 mg by mouth as needed.     . Multiple Vitamin (MULTIVITAMIN) tablet Take 1 tablet by mouth daily.    .  Multiple Vitamins-Minerals (PRESERVISION AREDS 2+MULTI VIT PO) Take 1 tablet 2 (two) times daily by mouth.    Marland Kitchen OVER THE COUNTER MEDICATION CBD  Oil daily    . pantoprazole (PROTONIX) 40 MG tablet Take 1 tablet (40 mg total) by mouth daily. 90 tablet 3  . sildenafil (REVATIO) 20 MG tablet TAKE 3 TO 5 TABLETS BY MOUTH ONCE DAILY AS NEEDED 50 tablet 6  . simvastatin (ZOCOR) 10 MG tablet Take 1 tablet (10 mg total) by mouth at bedtime. 90 tablet 3  . terbinafine (LAMISIL) 1 % cream Apply topically 2 (two) times daily as needed.      No current facility-administered medications on file prior to visit.    Review of Systems:  As per HPI- otherwise negative.   Physical Examination: Vitals:   08/10/19 0844  BP: 117/73  Pulse: 70  Resp: 17  Temp: 97.7 F (36.5 C)  SpO2: 97%   Vitals:   08/10/19 0844  Weight: 179 lb (81.2 kg)  Height: 5\' 9"  (1.753 m)   Body mass index is 26.43 kg/m. Ideal Body Weight: Weight in (lb) to have BMI = 25: 168.9  GEN: no acute distress.  Minimal overweight, looks well  HEENT: Atraumatic, Normocephalic.  Ears and Nose: No external deformity. CV: RRR, No M/G/R. No JVD. No thrill. No extra heart sounds. PULM: CTA B, no wheezes, crackles, rhonchi. No retractions. No resp. distress. No accessory muscle use. ABD: S, NT, ND, +BS. No rebound. No HSM. EXTR: No c/c/e PSYCH: Normally interactive. Conversant.  Right knee: displays skin nodule as below,  Rolled edges and ulcerated center  Bilateral shoulder ROM is normal, he notes mild tenderness of both deltoids and biceps Normal strength of BUE      Assessment and Plan: Dyslipidemia - Plan: Comprehensive metabolic panel, Lipid panel  Skin nodule  He has an appt with derm coming up for skin nodule- encouraged him to keep this appt for possible removal, this may be a BCC Discussed his lipids and shoulder pain- will have him experiment with holding his simvastatin and see if pain resolves.  He will let me  know what happens Can also consider lovaza, etc as an HDL booster for him  Moderate medical decision making today  This visit occurred during the SARS-CoV-2 public health emergency.  Safety protocols were in place, including screening questions prior to the visit, additional usage of staff PPE, and extensive cleaning of exam room while observing appropriate contact time as indicated for disinfecting solutions.    Signed Lamar Blinks, MD  Received his labs as below, message  to patient  Results for orders placed or performed in visit on 08/10/19  Comprehensive metabolic panel  Result Value Ref Range   Sodium 138 135 - 145 mEq/L   Potassium 4.3 3.5 - 5.1 mEq/L   Chloride 102 96 - 112 mEq/L   CO2 31 19 - 32 mEq/L   Glucose, Bld 95 70 - 99 mg/dL   BUN 19 6 - 23 mg/dL   Creatinine, Ser 0.90 0.40 - 1.50 mg/dL   Total Bilirubin 1.7 (H) 0.2 - 1.2 mg/dL   Alkaline Phosphatase 51 39 - 117 U/L   AST 17 0 - 37 U/L   ALT 17 0 - 53 U/L   Total Protein 7.1 6.0 - 8.3 g/dL   Albumin 4.4 3.5 - 5.2 g/dL   GFR 83.76 >60.00 mL/min   Calcium 9.0 8.4 - 10.5 mg/dL  Lipid panel  Result Value Ref Range   Cholesterol 115 0 - 200 mg/dL   Triglycerides 68.0 0.0 - 149.0 mg/dL   HDL 39.50 >39.00 mg/dL   VLDL 13.6 0.0 - 40.0 mg/dL   LDL Cholesterol 62 0 - 99 mg/dL   Total CHOL/HDL Ratio 3    NonHDL 75.84    Lipids have improved, LDL came down and HDL came up; however there is not a huge difference

## 2019-08-07 NOTE — Patient Instructions (Addendum)
It was great to see you again today, I will be in touch with your labs as soon as possible Please get your COVID-19 vaccine if not done already- DONE!  This fall, you can also consider getting the Shingrix vaccine for shingles-in October it will be 5 years since you had Zostavax  I will be in touch with your labs HOLD your simvastatin for 2-3 weeks and let's see if your shoulder pain resolves.  Please let me know what you find out here - we will also see how much your cholesterol has improved and make a plan for the next step   Please be sure to see dermatology as planned regarding the lesion on your right knee- I suspect derm may want to remove or biopsy it

## 2019-08-09 ENCOUNTER — Other Ambulatory Visit: Payer: Self-pay

## 2019-08-10 ENCOUNTER — Ambulatory Visit: Payer: BC Managed Care – PPO | Admitting: Family Medicine

## 2019-08-10 ENCOUNTER — Encounter: Payer: Self-pay | Admitting: Family Medicine

## 2019-08-10 ENCOUNTER — Other Ambulatory Visit: Payer: Self-pay

## 2019-08-10 VITALS — BP 117/73 | HR 70 | Temp 97.7°F | Resp 17 | Ht 69.0 in | Wt 179.0 lb

## 2019-08-10 DIAGNOSIS — R229 Localized swelling, mass and lump, unspecified: Secondary | ICD-10-CM

## 2019-08-10 DIAGNOSIS — E785 Hyperlipidemia, unspecified: Secondary | ICD-10-CM | POA: Diagnosis not present

## 2019-08-10 LAB — COMPREHENSIVE METABOLIC PANEL
ALT: 17 U/L (ref 0–53)
AST: 17 U/L (ref 0–37)
Albumin: 4.4 g/dL (ref 3.5–5.2)
Alkaline Phosphatase: 51 U/L (ref 39–117)
BUN: 19 mg/dL (ref 6–23)
CO2: 31 mEq/L (ref 19–32)
Calcium: 9 mg/dL (ref 8.4–10.5)
Chloride: 102 mEq/L (ref 96–112)
Creatinine, Ser: 0.9 mg/dL (ref 0.40–1.50)
GFR: 83.76 mL/min (ref >60.00)
Glucose, Bld: 95 mg/dL (ref 70–99)
Potassium: 4.3 mEq/L (ref 3.5–5.1)
Sodium: 138 mEq/L (ref 135–145)
Total Bilirubin: 1.7 mg/dL — ABNORMAL HIGH (ref 0.2–1.2)
Total Protein: 7.1 g/dL (ref 6.0–8.3)

## 2019-08-10 LAB — LIPID PANEL
Cholesterol: 115 mg/dL (ref 0–200)
HDL: 39.5 mg/dL (ref >39.00)
LDL Cholesterol: 62 mg/dL (ref 0–99)
NonHDL: 75.84
Total CHOL/HDL Ratio: 3
Triglycerides: 68 mg/dL (ref 0.0–149.0)
VLDL: 13.6 mg/dL (ref 0.0–40.0)

## 2019-08-29 ENCOUNTER — Encounter: Payer: Self-pay | Admitting: Family Medicine

## 2019-09-07 ENCOUNTER — Encounter: Payer: Self-pay | Admitting: Gastroenterology

## 2019-09-07 ENCOUNTER — Ambulatory Visit: Payer: BC Managed Care – PPO | Admitting: Gastroenterology

## 2019-09-07 ENCOUNTER — Other Ambulatory Visit: Payer: Self-pay

## 2019-09-07 VITALS — BP 118/76 | HR 73 | Temp 97.7°F | Ht 69.0 in | Wt 180.4 lb

## 2019-09-07 DIAGNOSIS — R17 Unspecified jaundice: Secondary | ICD-10-CM | POA: Diagnosis not present

## 2019-09-07 DIAGNOSIS — R05 Cough: Secondary | ICD-10-CM | POA: Diagnosis not present

## 2019-09-07 DIAGNOSIS — K219 Gastro-esophageal reflux disease without esophagitis: Secondary | ICD-10-CM

## 2019-09-07 DIAGNOSIS — R053 Chronic cough: Secondary | ICD-10-CM

## 2019-09-07 NOTE — Progress Notes (Signed)
P  Chief Complaint:    Chronic cough, reflux  GI History: William Curry a 69 y.o.malewith a history of hypertension, AV blocks/ppacemaker 2013, follows in the GI clinic for reflux.  Longstanding history of refluxfor 20 years or so, characterized asdry cough predominantly, with occasional regurgitation and dyspepsia.  No dysphagia.Rareglobus. No nocturnal sxs. No inciting foods.  Symptoms started increasing in frequency/severity approximately 2018.  Initially treated with Tumswith improvement.Sxs worsened 10 years agoand started OTC Prilosec and Zantac on/off, again responsive to therapy. Started on Protonix 40 mg bid x4 weeks, with good clinical response, and titrated to 40 mg/day with continued response.  Endoscopic history: -EGD (02/2019, Dr. Bryan Curry): Candida esophagus, normal Z-line, mild gastritis, hyperplastic gastric polyps -Colonoscopy (2014, Dr. Sharlett Curry): Normal. Repeat in 10 years  HPI:     Patient is a 69 y.o. male presenting to the Gastroenterology Clinic for follow-up.  Last seen by me on 03/22/2019.  Typical reflux symptoms of HB, regurgitation generally well controlled with Protonix 40 mg/day, but still with intermittent dry cough after changing from bid to daily PPI.  Today, he states cough essentially unchanged. Still taking Protonix 40 mg/day pre-breakfast which controls HB and regurgitation.  Cough seems to be worse when reclining or supine.  CMP last month with T bili 1.7, otherwise normal.  History of mildly elevated T bili in the past (1.3 in 2019).  No D bili but suspect Gilbert's.  Review of systems:     No chest pain, no SOB, no fevers, no urinary sx   Past Medical History:  Diagnosis Date  . DDD (degenerative disc disease), lumbar   . GERD (gastroesophageal reflux disease)   . 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 History of chicken pox   . Hypertension 2002-2005  . Kidney stone 1994-2010   Passed stone in 1994,  Lithotripsy in 2010, stones passed in 2018  . Macular degeneration   . Scoliosis   . 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 Tinea versicolor     Patient's surgical history, family medical history, social history, medications and allergies were all reviewed in Epic    Current Outpatient Medications  Medication Sig Dispense Refill  . Multiple Vitamin (MULTIVITAMIN) tablet Take 1 tablet by mouth daily.    . Multiple Vitamins-Minerals (PRESERVISION AREDS 2+MULTI VIT PO) Take 1 tablet 2 (two) times daily by mouth.    Marland Kitchen OVER THE COUNTER MEDICATION CBD  Oil daily    . pantoprazole (PROTONIX) 40 MG tablet Take 1 tablet (40 mg total) by mouth daily. 90 tablet 3  . sildenafil (REVATIO) 20 MG tablet TAKE 3 TO 5 TABLETS BY MOUTH ONCE DAILY AS NEEDED 50 tablet 6  . terbinafine (LAMISIL) 1 % cream Apply topically 2 (two) times daily as needed.     . valACYclovir (VALTREX) 500 MG tablet Take 500 mg by mouth as needed.      No current facility-administered medications for this visit.    Physical Exam:     BP 118/76   Pulse 73   Temp 97.7 F (36.5 C)   Ht 5\' 9"  (1.753 m)   Wt 180 lb 6 oz (81.8 kg)   BMI 26.64 kg/m   GENERAL:  Pleasant male in NAD PSYCH: : Cooperative, normal affect EENT:  conjunctiva pink, mucous membranes moist Musculoskeletal:  Normal muscle tone, normal strength NEURO: Alert and oriented x 3, no focal neurologic deficits   IMPRESSION and PLAN:  1) GERD 2) Chronic cough -Good clinical response with PPI for typical reflux symptoms, but still with breakthrough chronic cough.  Again, suspect cough is atypical reflux manifestation. -Referral to ENT for further evaluation and possible laryngoscopy to rule out additional causes for chronic cough -Resume current PPI (pantoprazole) -Resume antireflux lifestyle/dietary modifications -Discussed further evaluation with pH/impedance testing (on PPI) or [place], but elected to hold off for now -Pending above  work-up, can also involve Pulmonary referral -Briefly discussed antireflux surgical options, namely TIF  3) Elevated total bilirubin: -At next lab check, check bilirubin fractionization for confirmation of suspected Gilbert's syndrome.  Liver enzymes otherwise normal.          William Curry William Curry ,DO, FACG 09/07/2019, 9:17 AM

## 2019-09-07 NOTE — Patient Instructions (Signed)
We have sent a referral to  ENT. You will be receiving a call to schedule an appointment with them soon.  It was a pleasure to see you today!  Vito Cirigliano, D.O.

## 2019-10-03 ENCOUNTER — Telehealth: Payer: Self-pay | Admitting: Gastroenterology

## 2019-10-03 NOTE — Telephone Encounter (Signed)
Pt stated that he has not received a call from ENT office regarding referral.

## 2019-10-04 NOTE — Telephone Encounter (Signed)
Spoke to Dr Jennell Corner nurse at the ENT office who states that they are very backed up with referrals,but she will contact the patient today to schedule his an appointment. Patient has been notified with this information.

## 2019-10-10 ENCOUNTER — Ambulatory Visit (INDEPENDENT_AMBULATORY_CARE_PROVIDER_SITE_OTHER): Payer: BC Managed Care – PPO | Admitting: Otolaryngology

## 2019-10-10 ENCOUNTER — Other Ambulatory Visit: Payer: Self-pay

## 2019-10-10 ENCOUNTER — Encounter (INDEPENDENT_AMBULATORY_CARE_PROVIDER_SITE_OTHER): Payer: Self-pay | Admitting: Otolaryngology

## 2019-10-10 VITALS — Temp 97.9°F

## 2019-10-10 DIAGNOSIS — R05 Cough: Secondary | ICD-10-CM

## 2019-10-10 DIAGNOSIS — K219 Gastro-esophageal reflux disease without esophagitis: Secondary | ICD-10-CM

## 2019-10-10 DIAGNOSIS — R053 Chronic cough: Secondary | ICD-10-CM

## 2019-10-10 NOTE — Progress Notes (Signed)
HPI: William Curry is a 69 y.o. male who presents is referred by by his PCP for evaluation of chronic cough that he has had for 3 to 4 years.  He feels like it is gotten a little bit worse recently.  He feels like he has mucus or tickle in his throat but when he coughs he coughs up minimal mucus which is generally clear.  He has had previous upper GI endoscopy and has some degree of reflux for which he takes pantoprazole 40 mg in the morning.  He is apparently had a negative chest x-ray. He stated that his cough initially started after taking chronic NSAIDs for chronic back pain.  He is since switch to CBD and takes  minimal NSAIDs.Marland Kitchen He has had no hoarseness. He never smoked. Denies any nasal sinus issues.  Past Medical History:  Diagnosis Date  . DDD (degenerative disc disease), lumbar   . GERD (gastroesophageal reflux disease)   . 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 History of chicken pox   . Hypertension 2002-2005  . Kidney stone 1994-2010   Passed stone in 1994, Lithotripsy in 2010, stones passed in 2018  . Macular degeneration   . Scoliosis   . 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 Tinea versicolor    Past Surgical History:  Procedure Laterality Date  . CARTILAGE REPAIR  Nov.-1989   Left knee-HARD TO WAKE UP AFTER SX  . COLONOSCOPY    . KNEE ARTHROSCOPY WITH ANTERIOR CRUCIATE LIGAMENT (ACL) REPAIR  [phone]   Left knee-HARD TO WAKE UP AFTER SX  . LITHOTRIPSY  Jun-2010   Kidney stone (1st stone 1994 - Passed)  . PACEMAKER INSTALLATION  Jan-2013   Complete Heart Block  . SKIN BIOPSY     RIght knee for skin cancer   Social History   Socioeconomic History  . Marital status: Married    Spouse name: Not on file  . Number of children: Not on file  . Years of education: Not on file  . Highest education level: Not on file  Occupational History  . Occupation: Insurance underwriter / Accountant  Tobacco Use  . Smoking status: Never Smoker  . Smokeless  tobacco: Former Systems developer    Types: Chew  . Tobacco comment: admits do doing chewing tabacco. stopped in 2005  Vaping Use  . Vaping Use: Never used  Substance and Sexual Activity  . Alcohol use: Yes    Alcohol/week: 6.0 standard drinks    Types: 6 Cans of beer per week    Comment: 4 - 6 beers a week  . Drug use: No  . Sexual activity: Not on file  Other Topics Concern  . Not on file  Social History Narrative   Formerly with [employer], prev did some accounting work   To [place] 2013   Remarried 2007   [employer] [place] '71-'75   Social Determinants of Health   Financial Resource Strain:   . Difficulty of Paying Living Expenses:   Food Insecurity:   . Worried About Charity fundraiser in the Last Year:   . Arboriculturist in the Last Year:   Transportation Needs:   . Film/video editor (Medical):   Marland Kitchen Lack of Transportation (Non-Medical):   Physical Activity:   . Days of Exercise per Week:   . Minutes of Exercise per Session:   Stress:   . Feeling of Stress :  Social Connections:   . Frequency of Communication with Friends and Family:   . Frequency of Social Gatherings with Friends and Family:   . Attends Religious Services:   . Active Member of Clubs or Organizations:   . Attends Archivist Meetings:   Marland Kitchen Marital Status:    Family History  Problem Relation Age of Onset  . Cancer Mother 41       Breast  . Diabetes Mother 72       on insulin  . Breast cancer Mother   . Diabetes Father 75       on insulin  . Bladder Cancer Father   . Colon cancer Maternal Grandfather   . Colon polyps Sister   . Esophageal cancer Neg Hx   . Rectal cancer Neg Hx   . Stomach cancer Neg Hx   . Prostate cancer Neg Hx    Allergies  Allergen Reactions  . Penicillins Other (See Comments)    As a child   Prior to Admission medications   Medication Sig Start Date End Date Taking? Authorizing Provider  Multiple Vitamin (MULTIVITAMIN) tablet Take 1 tablet by mouth  daily.    [provider]  Multiple Vitamins-Minerals (PRESERVISION AREDS 2+MULTI VIT PO) Take 1 tablet 2 (two) times daily by mouth.    [provider]  OVER THE COUNTER MEDICATION CBD  Oil daily    [provider]  pantoprazole (PROTONIX) 40 MG tablet Take 1 tablet (40 mg total) by mouth daily. 02/23/19   Cirigliano, Vito V, DO  sildenafil (REVATIO) 20 MG tablet TAKE 3 TO 5 TABLETS BY MOUTH ONCE DAILY AS NEEDED 02/28/19   Copland, Gay Filler, MD  terbinafine (LAMISIL) 1 % cream Apply topically 2 (two) times daily as needed.     [provider]  valACYclovir (VALTREX) 500 MG tablet Take 500 mg by mouth as needed.  08/29/19   [provider]     Positive ROS: Otherwise negative  All other systems have been reviewed and were otherwise negative with the exception of those mentioned in the HPI and as above.  Physical Exam: Constitutional: Alert, well-appearing, no acute distress Ears: External ears without lesions or tenderness. Ear canals are clear bilaterally with intact, clear TMs bilaterally. Nasal: External nose without lesions. Septum midline with mild rhinitis with clear mucus discharge.  Both middle meatus regions were clear with no signs of infection.. Oral: Lips and gums without lesions. Tongue and palate mucosa without lesions. Posterior oropharynx clear. Fiberoptic laryngoscopy was performed to the right nostril.  The nasopharynx was clear.  The base of tongue vallecula and epiglottis were normal.  AE folds were clear bilaterally.  Vocal cords were clear bilaterally with normal vocal cord mobility.  Piriform sinuses were clear bilaterally.  The fiberoptic laryngoscope was passed through the upper esophageal sphincter without difficulty with clear upper cervical esophagus. Neck: No palpable adenopathy or masses.  No palpable thyroid nodules noted. Respiratory: Breathing comfortably  Skin: No facial/neck lesions or rash  noted.  Procedures  Assessment: Chronic cough questionable etiology. Apparently patient has had history of GE reflux disease and is presently on pantoprazole that he takes in the morning. Clear upper airway examination on fiberoptic laryngoscopy.  Plan: Reviewed with him concerning normal upper airway examination.  Recommended switching his pantoprazole to before dinner instead of first thing in the morning as this will provide better nighttime coverage of GERD when silent reflux occurs more often. Reassured him of normal upper airway examination otherwise.  Radene Journey, MD   CC:

## 2019-11-16 ENCOUNTER — Ambulatory Visit (INDEPENDENT_AMBULATORY_CARE_PROVIDER_SITE_OTHER): Payer: BC Managed Care – PPO | Admitting: *Deleted

## 2019-11-16 DIAGNOSIS — I442 Atrioventricular block, complete: Secondary | ICD-10-CM

## 2019-11-16 LAB — CUP PACEART REMOTE DEVICE CHECK
Battery Remaining Longevity: 26 mo
Battery Remaining Percentage: 22 %
Battery Voltage: 2.78 V
Brady Statistic AP VP Percent: 12 %
Brady Statistic AP VS Percent: 6.6 %
Brady Statistic AS VP Percent: 68 %
Brady Statistic AS VS Percent: 14 %
Brady Statistic RA Percent Paced: 19 %
Brady Statistic RV Percent Paced: 80 %
Date Time Interrogation Session: 20210728035055
Implantable Lead Implant Date: 20130115
Implantable Lead Implant Date: 20130115
Implantable Lead Location: 753859
Implantable Lead Location: 753860
Implantable Pulse Generator Implant Date: 20130115
Lead Channel Impedance Value: 360 Ohm
Lead Channel Impedance Value: 390 Ohm
Lead Channel Pacing Threshold Amplitude: 0.5 V
Lead Channel Pacing Threshold Amplitude: 1.625 V
Lead Channel Pacing Threshold Pulse Width: 0.4 ms
Lead Channel Pacing Threshold Pulse Width: 0.4 ms
Lead Channel Sensing Intrinsic Amplitude: 2.2 mV
Lead Channel Sensing Intrinsic Amplitude: 3.4 mV
Lead Channel Setting Pacing Amplitude: 1.5 V
Lead Channel Setting Pacing Amplitude: 1.875
Lead Channel Setting Pacing Pulse Width: 0.4 ms
Lead Channel Setting Sensing Sensitivity: 0.5 mV
Pulse Gen Model: 2210
Pulse Gen Serial Number: 7304172

## 2019-11-18 NOTE — Progress Notes (Signed)
Remote pacemaker transmission.   

## 2019-12-28 IMAGING — DX DG CHEST 2V
2 series · 2 of 2 positions shown · non-contrast
Comparison: None.

CLINICAL DATA: Cough

EXAM:
CHEST - 2 VIEW

[chest pa]
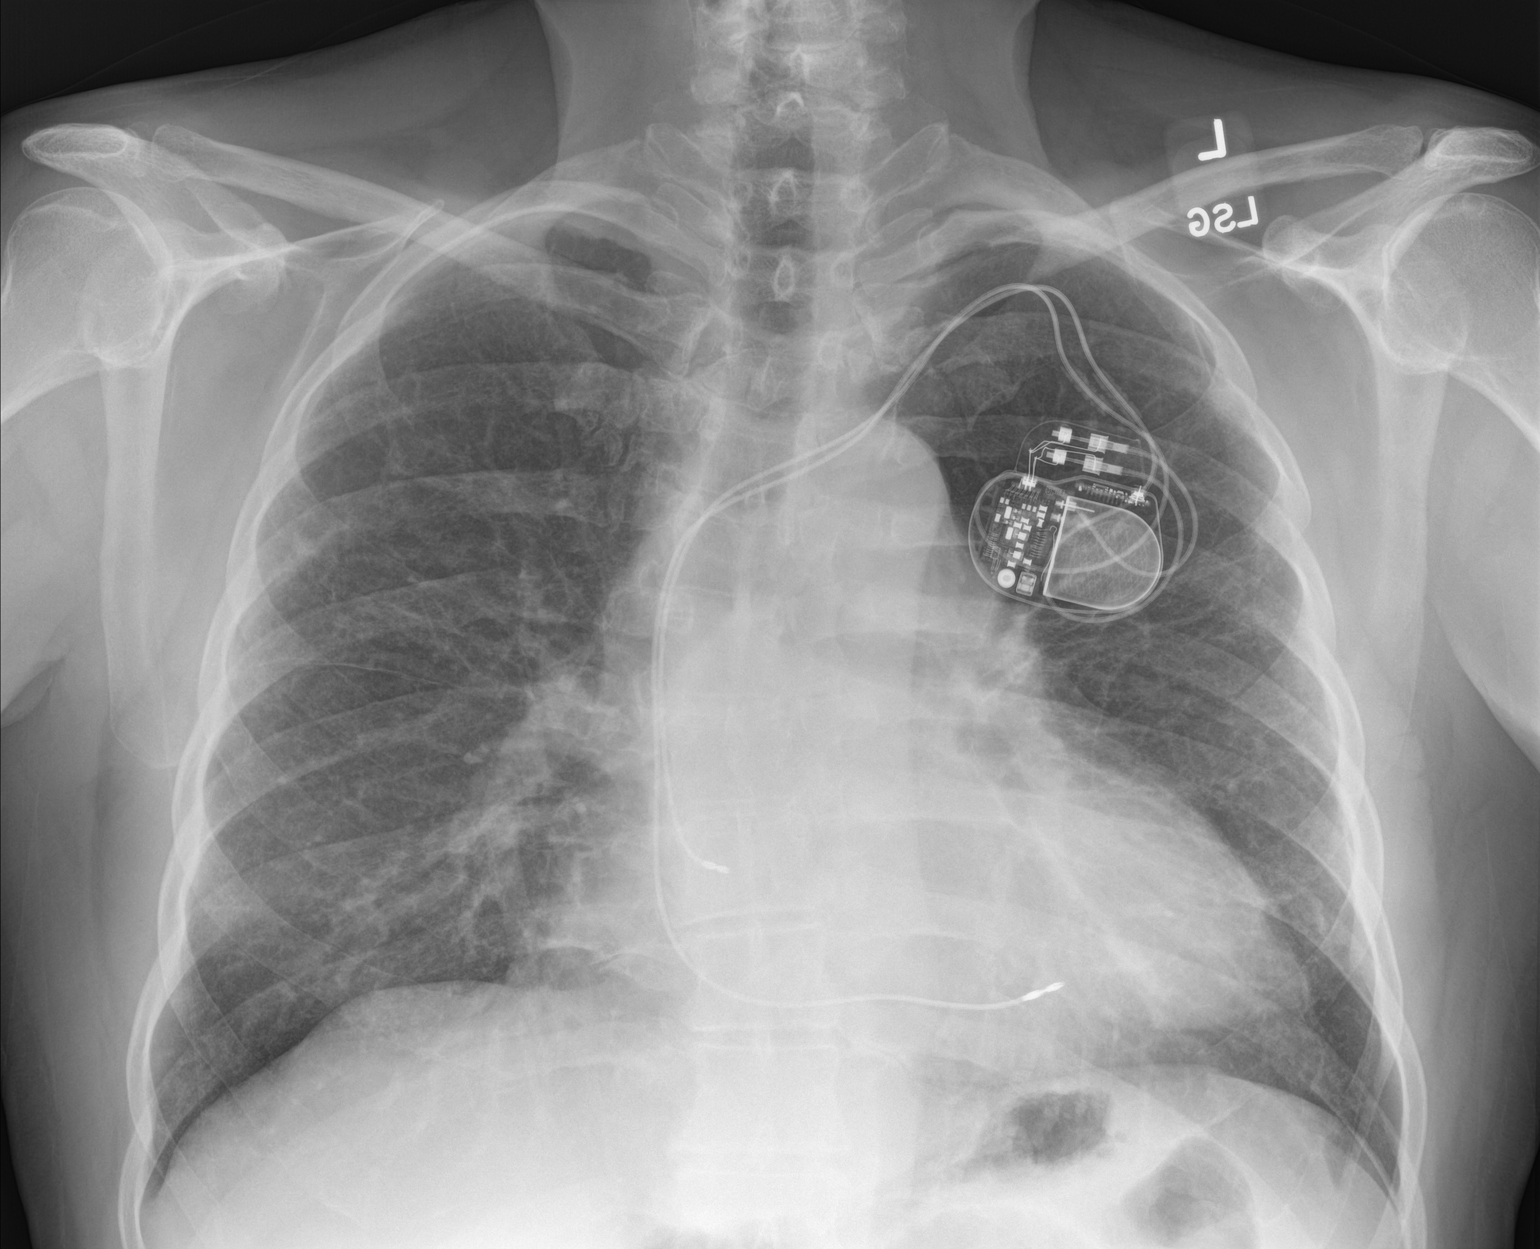

[chest lat]
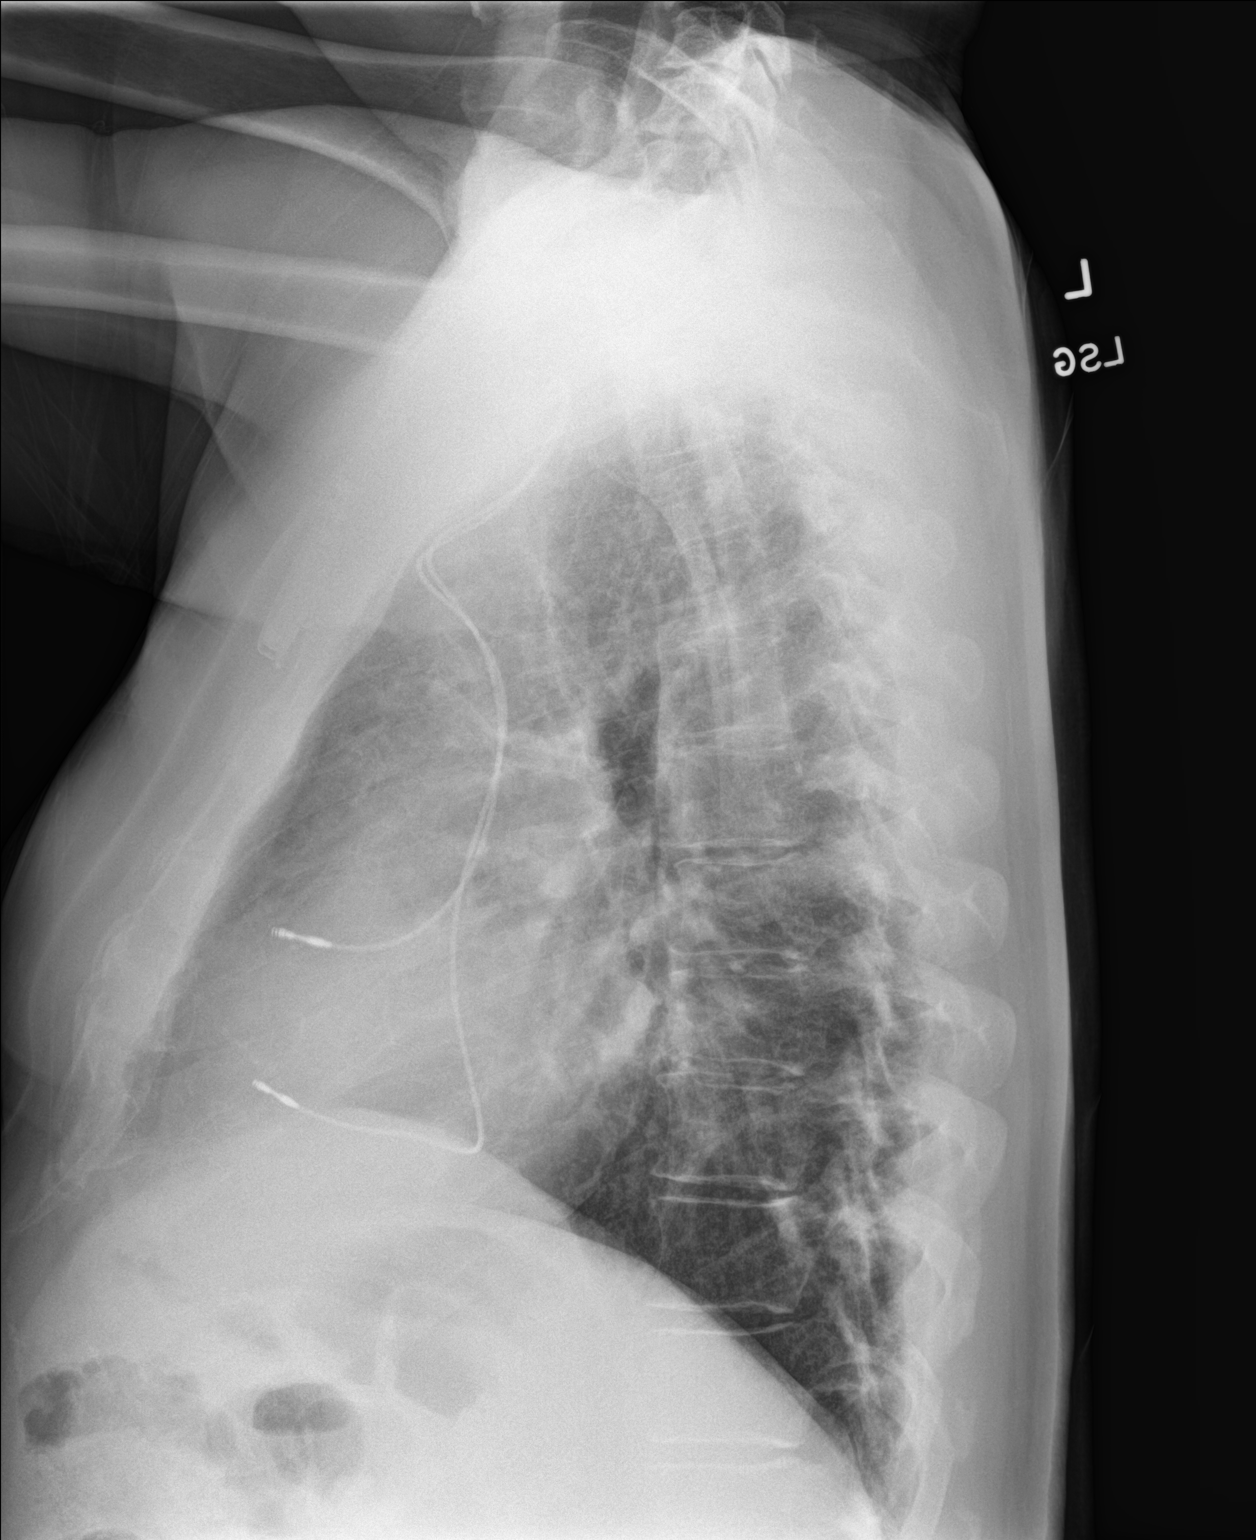

[2 of 2 positions shown; findings below may reference images not displayed]

FINDINGS: Cardiac shadow is enlarged. Pacing device is noted. The lungs are
well aerated bilaterally with some minimal interstitial changes. No
focal confluent infiltrate or effusion is seen. No bony abnormality
is noted.
IMPRESSION: No acute abnormality seen.

## 2020-02-13 ENCOUNTER — Other Ambulatory Visit: Payer: Self-pay | Admitting: Gastroenterology

## 2020-02-15 ENCOUNTER — Ambulatory Visit (INDEPENDENT_AMBULATORY_CARE_PROVIDER_SITE_OTHER): Payer: BC Managed Care – PPO

## 2020-02-15 DIAGNOSIS — I442 Atrioventricular block, complete: Secondary | ICD-10-CM

## 2020-02-15 LAB — CUP PACEART REMOTE DEVICE CHECK
Battery Remaining Longevity: 20 mo
Battery Remaining Percentage: 17 %
Battery Voltage: 2.75 V
Brady Statistic AP VP Percent: 12 %
Brady Statistic AP VS Percent: 7 %
Brady Statistic AS VP Percent: 67 %
Brady Statistic AS VS Percent: 14 %
Brady Statistic RA Percent Paced: 19 %
Brady Statistic RV Percent Paced: 79 %
Date Time Interrogation Session: 20211027022800
Implantable Lead Implant Date: 20130115
Implantable Lead Implant Date: 20130115
Implantable Lead Location: 753859
Implantable Lead Location: 753860
Implantable Pulse Generator Implant Date: 20130115
Lead Channel Impedance Value: 350 Ohm
Lead Channel Impedance Value: 360 Ohm
Lead Channel Pacing Threshold Amplitude: 0.375 V
Lead Channel Pacing Threshold Amplitude: 1.75 V
Lead Channel Pacing Threshold Pulse Width: 0.4 ms
Lead Channel Pacing Threshold Pulse Width: 0.4 ms
Lead Channel Sensing Intrinsic Amplitude: 2.3 mV
Lead Channel Sensing Intrinsic Amplitude: 2.7 mV
Lead Channel Setting Pacing Amplitude: 1.375
Lead Channel Setting Pacing Amplitude: 2 V
Lead Channel Setting Pacing Pulse Width: 0.4 ms
Lead Channel Setting Sensing Sensitivity: 0.5 mV
Pulse Gen Model: 2210
Pulse Gen Serial Number: 7304172

## 2020-02-20 NOTE — Progress Notes (Signed)
Remote pacemaker transmission.   

## 2020-02-22 ENCOUNTER — Encounter: Payer: Self-pay | Admitting: Family Medicine

## 2020-02-22 NOTE — Patient Instructions (Addendum)
It was great to see you again today, I will be in touch with your labs soon as possible Let me know if your shoulders do not get back to normal over the next few months  We will do your flu shot today, and have you come in next week for your pneumonia shot and labs You should also consider getting the shingles vaccine and a covid 19 booster at your pharmacy  I am so sorry to hear of the loss of your son Bebe Shaggy.  Please let me know if you are struggling with depression in the months to come.  You might look into the [facility] Network for support after child loss    Health Maintenance After Age 35 After age 47, you are at a higher risk for certain long-term diseases and infections as well as injuries from falls. Falls are a major cause of broken bones and head injuries in people who are older than age 55. Getting regular preventive care can help to keep you healthy and well. Preventive care includes getting regular testing and making lifestyle changes as recommended by your health care provider. Talk with your health care provider about:  Which screenings and tests you should have. A screening is a test that checks for a disease when you have no symptoms.  A diet and exercise plan that is right for you. What should I know about screenings and tests to prevent falls? Screening and testing are the best ways to find a health problem early. Early diagnosis and treatment give you the best chance of managing medical conditions that are common after age 52. Certain conditions and lifestyle choices may make you more likely to have a fall. Your health care provider may recommend:  Regular vision checks. Poor vision and conditions such as cataracts can make you more likely to have a fall. If you wear glasses, make sure to get your prescription updated if your vision changes.  Medicine review. Work with your health care provider to regularly review all of the medicines you are taking, including  over-the-counter medicines. Ask your health care provider about any side effects that may make you more likely to have a fall. Tell your health care provider if any medicines that you take make you feel dizzy or sleepy.  Osteoporosis screening. Osteoporosis is a condition that causes the bones to get weaker. This can make the bones weak and cause them to break more easily.  Blood pressure screening. Blood pressure changes and medicines to control blood pressure can make you feel dizzy.  Strength and balance checks. Your health care provider may recommend certain tests to check your strength and balance while standing, walking, or changing positions.  Foot health exam. Foot pain and numbness, as well as not wearing proper footwear, can make you more likely to have a fall.  Depression screening. You may be more likely to have a fall if you have a fear of falling, feel emotionally low, or feel unable to do activities that you used to do.  Alcohol use screening. Using too much alcohol can affect your balance and may make you more likely to have a fall. What actions can I take to lower my risk of falls? General instructions  Talk with your health care provider about your risks for falling. Tell your health care provider if: ? You fall. Be sure to tell your health care provider about all falls, even ones that seem minor. ? You feel dizzy, sleepy, or off-balance.  Take  over-the-counter and prescription medicines only as told by your health care provider. These include any supplements.  Eat a healthy diet and maintain a healthy weight. A healthy diet includes low-fat dairy products, low-fat (lean) meats, and fiber from whole grains, beans, and lots of fruits and vegetables. Home safety  Remove any tripping hazards, such as rugs, cords, and clutter.  Install safety equipment such as grab bars in bathrooms and safety rails on stairs.  Keep rooms and walkways well-lit. Activity   Follow a  regular exercise program to stay fit. This will help you maintain your balance. Ask your health care provider what types of exercise are appropriate for you.  If you need a cane or walker, use it as recommended by your health care provider.  Wear supportive shoes that have nonskid soles. Lifestyle  Do not drink alcohol if your health care provider tells you not to drink.  If you drink alcohol, limit how much you have: ? 0-1 drink a day for women. ? 0-2 drinks a day for men.  Be aware of how much alcohol is in your drink. In the [place] one drink equals one typical bottle of beer (12 oz), one-half glass of wine (5 oz), or one shot of hard liquor (1 oz).  Do not use any products that contain nicotine or tobacco, such as cigarettes and e-cigarettes. If you need help quitting, ask your health care provider. Summary  Having a healthy lifestyle and getting preventive care can help to protect your health and wellness after age 81.  Screening and testing are the best way to find a health problem early and help you avoid having a fall. Early diagnosis and treatment give you the best chance for managing medical conditions that are more common for people who are older than age 71.  Falls are a major cause of broken bones and head injuries in people who are older than age 66. Take precautions to prevent a fall at home.  Work with your health care provider to learn what changes you can make to improve your health and wellness and to prevent falls. This information is not intended to replace advice given to you by your health care provider. Make sure you discuss any questions you have with your health care provider. Document Revised: 07/29/2018 Document Reviewed: 02/18/2017 Elsevier Patient Education  2020 [employer].

## 2020-02-22 NOTE — Progress Notes (Signed)
[facility] at [facility] [place], [place],  [phone]  Date:  02/23/2020   Name:  William Curry   DOB:  [birth year]   MRN:  [id number]  PCP:  William Mclean, MD    Chief Complaint: Annual Exam (flu shot, pneumonia shot)   History of Present Illness:  William Curry is a 69 y.o. very pleasant male patient who presents with the following:  Patient here today for a physical exam and follow-up- Patient with history of complete AV block with pacemaker, ED, GERD, hypertension, macular degeneration, chronic tinea versicolor  He transferred to me last year, I also take care of his wife William Curry He is a retired Gaffer, has 4 children and 6 grandchildren in [place] and one stepson/2 grandchildren locally  Last seen by myself in April We have been evaluating him for chronic cough, he has seen both ENT and gastroenterology for this issue.  Working diagnosis at this time is GERD, being treated with Protonix A friend also suggested that he might have Wegener's granulomatosis-this seems less likely, but we can certainly do some laboratory evaluation  Flu vaccine- give today  Give Pneumovax today-patient would like to get next week, would prefer not to do 2 immunizations in 1 day COVID-19 series complete,?  Booster Shingrix Colon cancer screen up-to-date  CMP, lipids done in April He does have elevated bilirubin, should have fractionation-can order Due for PSA  We tried simvastatin for lipids last year- it caused a lot of body aches esp in his shoulders He stopped using this and he is improved but shoulders are still not all the way back to normal  His dad had a lot of shoulder immobility Right now William Curry does not feel that he needs to see ortho, but will let me know if he does not continue to improve He is doing some rehab at home  Tragically, his son William Curry died in 2020-02-05- he had pneumonia and some  complications with heart disease and passed away [place] children are 26 and 69 yo William Curry and his family lived in [place] Patient Active Problem List   Diagnosis Date Noted  . Knee pain 03/02/2017  . Insomnia 06/11/2015  . Erectile dysfunction 06/11/2015  . GERD (gastroesophageal reflux disease) 05/17/2014  . 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 Elevated blood pressure 06/13/2013  . Pacemaker-[facility] 05/04/2012  . LBBB (left bundle branch block) 04/02/2012  . Tinea versicolor 04/02/2012  . DDD (degenerative disc disease), lumbar 04/02/2012    Past Medical History:  Diagnosis Date  . DDD (degenerative disc disease), lumbar   . GERD (gastroesophageal reflux disease)   . 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 History of chicken pox   . Hypertension 2002-2005  . Kidney stone 1994-2010   Passed stone in 1994, Lithotripsy in 2010, stones passed in 2018  . Macular degeneration   . Scoliosis   . 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 Tinea versicolor     Past Surgical History:  Procedure Laterality Date  . CARTILAGE REPAIR  Nov.-1989   Left knee-HARD TO WAKE UP AFTER SX  . COLONOSCOPY    . KNEE ARTHROSCOPY WITH ANTERIOR CRUCIATE LIGAMENT ([facility]) REPAIR  [phone]   Left knee-HARD TO WAKE UP AFTER SX  . LITHOTRIPSY  Jun-2010   Kidney stone (1st stone 1994 - Passed)  . PACEMAKER INSTALLATION  Jan-2013   Complete  Heart Block  . SKIN BIOPSY     RIght knee for skin cancer    Social History   Tobacco Use  . Smoking status: Never Smoker  . Smokeless tobacco: Former Systems developer    Types: Chew  . Tobacco comment: admits do doing chewing tabacco. stopped in 2005  Vaping Use  . Vaping Use: Never used  Substance Use Topics  . Alcohol use: Yes    Alcohol/week: 6.0 standard drinks    Types: 6 Cans of beer per week    Comment: 4 - 6 beers a week  . Drug use: No    Family History  Problem Relation Age of Onset  . Cancer  Mother 42       Breast  . Diabetes Mother 17       on insulin  . Breast cancer Mother   . Diabetes Father 75       on insulin  . Bladder Cancer Father   . Colon cancer Maternal Grandfather   . Colon polyps Sister   . Esophageal cancer Neg Hx   . Rectal cancer Neg Hx   . Stomach cancer Neg Hx   . Prostate cancer Neg Hx     Allergies  Allergen Reactions  . Penicillins Other (See Comments)    As a child    Medication list has been reviewed and updated.  Current Outpatient Medications on File Prior to Visit  Medication Sig Dispense Refill  . Multiple Vitamin (MULTIVITAMIN) tablet Take 1 tablet by mouth daily.    . Multiple Vitamins-Minerals (PRESERVISION AREDS 2+MULTI VIT PO) Take 1 tablet 2 (two) times daily by mouth.    Marland Kitchen OVER THE COUNTER MEDICATION CBD  Oil daily    . pantoprazole (PROTONIX) 40 MG tablet Take 1 tablet (40 mg total) by mouth daily. 90 tablet 3  . sildenafil (REVATIO) 20 MG tablet TAKE 3 TO 5 TABLETS BY MOUTH ONCE DAILY AS NEEDED 50 tablet 6  . terbinafine (LAMISIL) 1 % cream Apply topically 2 (two) times daily as needed.     . valACYclovir (VALTREX) 500 MG tablet Take 500 mg by mouth as needed.      No current facility-administered medications on file prior to visit.    Review of Systems:  As per HPI- otherwise negative.   Physical Examination: Vitals:   02/23/20 1259  BP: 140/78  Pulse: 79  Resp: 17  SpO2: 98%   Vitals:   02/23/20 1259  Weight: 173 lb (78.5 kg)  Height: 5\' 9"  (1.753 m)   Body mass index is 25.55 kg/m. Ideal Body Weight: Weight in (lb) to have BMI = 25: 168.9  GEN: no acute distress.  Looks well, normal weight HEENT: Atraumatic, Normocephalic.  Ears and Nose: No external deformity. CV: RRR, No M/G/R. No JVD. No thrill. No extra heart sounds. PULM: CTA B, no wheezes, crackles, rhonchi. No retractions. No resp. distress. No accessory muscle use. ABD: S, NT, ND, +BS. No rebound. No HSM. EXTR: No c/c/e PSYCH: Normally  interactive. Conversant.  He has unremarkable range of motion of both shoulders.  Some tenderness over the anterior rotator cuff tendon attachment.  Overall, shoulder function at this time is satisfactory to the patient  Assessment and Plan: Physical exam  Hyperbilirubinemia - Plan: Bilirubin, fractionated(tot/dir/indir)  Dyslipidemia - Plan: Lipid panel, CANCELED: Lipid panel  Screening for deficiency anemia - Plan: CBC, CANCELED: CBC  Screening for diabetes mellitus - Plan: Hemoglobin A1c, Comprehensive metabolic panel, CANCELED: Comprehensive metabolic panel, CANCELED: Hemoglobin A1c  Screening for prostate cancer - Plan: PSA, CANCELED: PSA  Cough - Plan: pantoprazole (PROTONIX) 40 MG tablet, Mpo/pr-3 (anca) antibodies, Sedimentation rate, C-reactive protein  Grief at loss of child  Needs flu shot - Plan: Flu Vaccine QUAD High Dose(Fluad)     Patient today for physical exam-encouraged healthy diet and exercise Labs are pending as above Gave flu shot He was not able to tolerate statins due to significant shoulder pain. Patient plans to come in next week for pneumonia vaccine and fasting labs Will obtain lab work as above to evaluate likelihood of granulomatosis//[facility]'s Offered support and our deepest condolences on the loss of his son in September At this time Lanis does not feel that he is depressed, but I encouraged him to contact me if he has concerns about his mood in the coming months.  I also suggested contacting the compassionate Friends network, which is a support group for bereaved parents  Will plan further follow- up pending labs.   This visit occurred during the SARS-CoV-2 public health emergency.  Safety protocols were in place, including screening questions prior to the visit, additional usage of staff PPE, and extensive cleaning of exam room while observing appropriate contact time as indicated for disinfecting solutions.    Signed Lamar Blinks, MD

## 2020-02-23 ENCOUNTER — Ambulatory Visit (INDEPENDENT_AMBULATORY_CARE_PROVIDER_SITE_OTHER): Payer: BC Managed Care – PPO | Admitting: Family Medicine

## 2020-02-23 ENCOUNTER — Other Ambulatory Visit: Payer: Self-pay

## 2020-02-23 ENCOUNTER — Encounter: Payer: Self-pay | Admitting: Family Medicine

## 2020-02-23 VITALS — BP 140/78 | HR 79 | Resp 17 | Ht 69.0 in | Wt 173.0 lb

## 2020-02-23 DIAGNOSIS — E785 Hyperlipidemia, unspecified: Secondary | ICD-10-CM

## 2020-02-23 DIAGNOSIS — Z0000 Encounter for general adult medical examination without abnormal findings: Secondary | ICD-10-CM | POA: Diagnosis not present

## 2020-02-23 DIAGNOSIS — Z23 Encounter for immunization: Secondary | ICD-10-CM | POA: Diagnosis not present

## 2020-02-23 DIAGNOSIS — Z634 Disappearance and death of family member: Secondary | ICD-10-CM

## 2020-02-23 DIAGNOSIS — R059 Cough, unspecified: Secondary | ICD-10-CM

## 2020-02-23 DIAGNOSIS — F4321 Adjustment disorder with depressed mood: Secondary | ICD-10-CM

## 2020-02-23 DIAGNOSIS — Z131 Encounter for screening for diabetes mellitus: Secondary | ICD-10-CM

## 2020-02-23 DIAGNOSIS — Z130 Encounter for screening for diseases of the blood and blood-forming organs and certain disorders involving the immune mechanism: Secondary | ICD-10-CM

## 2020-02-23 DIAGNOSIS — Z125 Encounter for screening for malignant neoplasm of prostate: Secondary | ICD-10-CM

## 2020-02-23 MED ORDER — PANTOPRAZOLE SODIUM 40 MG PO TBEC: 40.0000 mg | DELAYED_RELEASE_TABLET | Freq: Every day | ORAL | 3 refills | Status: DC

## 2020-03-01 ENCOUNTER — Encounter: Payer: Self-pay | Admitting: Family Medicine

## 2020-03-01 ENCOUNTER — Other Ambulatory Visit (INDEPENDENT_AMBULATORY_CARE_PROVIDER_SITE_OTHER): Payer: BC Managed Care – PPO

## 2020-03-01 ENCOUNTER — Ambulatory Visit: Payer: BC Managed Care – PPO

## 2020-03-01 ENCOUNTER — Other Ambulatory Visit: Payer: Self-pay

## 2020-03-01 ENCOUNTER — Ambulatory Visit (INDEPENDENT_AMBULATORY_CARE_PROVIDER_SITE_OTHER): Payer: BC Managed Care – PPO

## 2020-03-01 DIAGNOSIS — E785 Hyperlipidemia, unspecified: Secondary | ICD-10-CM

## 2020-03-01 DIAGNOSIS — Z23 Encounter for immunization: Secondary | ICD-10-CM | POA: Diagnosis not present

## 2020-03-01 DIAGNOSIS — Z131 Encounter for screening for diabetes mellitus: Secondary | ICD-10-CM | POA: Diagnosis not present

## 2020-03-01 DIAGNOSIS — Z125 Encounter for screening for malignant neoplasm of prostate: Secondary | ICD-10-CM | POA: Diagnosis not present

## 2020-03-01 DIAGNOSIS — Z130 Encounter for screening for diseases of the blood and blood-forming organs and certain disorders involving the immune mechanism: Secondary | ICD-10-CM | POA: Diagnosis not present

## 2020-03-01 LAB — LIPID PANEL
Cholesterol: 131 mg/dL (ref 0–200)
HDL: 40.1 mg/dL (ref >39.00)
LDL Cholesterol: 82 mg/dL (ref 0–99)
NonHDL: 90.42
Total CHOL/HDL Ratio: 3
Triglycerides: 42 mg/dL (ref 0.0–149.0)
VLDL: 8.4 mg/dL (ref 0.0–40.0)

## 2020-03-01 LAB — COMPREHENSIVE METABOLIC PANEL
ALT: 14 U/L (ref 0–53)
AST: 17 U/L (ref 0–37)
Albumin: 4.2 g/dL (ref 3.5–5.2)
Alkaline Phosphatase: 52 U/L (ref 39–117)
BUN: 18 mg/dL (ref 6–23)
CO2: 35 mEq/L — ABNORMAL HIGH (ref 19–32)
Calcium: 9 mg/dL (ref 8.4–10.5)
Chloride: 104 mEq/L (ref 96–112)
Creatinine, Ser: 0.94 mg/dL (ref 0.40–1.50)
GFR: 82.91 mL/min (ref >60.00)
Glucose, Bld: 92 mg/dL (ref 70–99)
Potassium: 4.4 mEq/L (ref 3.5–5.1)
Sodium: 143 mEq/L (ref 135–145)
Total Bilirubin: 1.1 mg/dL (ref 0.2–1.2)
Total Protein: 6.9 g/dL (ref 6.0–8.3)

## 2020-03-01 LAB — CBC
HCT: 45.1 % (ref 39.0–52.0)
Hemoglobin: 15.2 g/dL (ref 13.0–17.0)
MCHC: 33.7 g/dL (ref 30.0–36.0)
MCV: 96.2 fl (ref 78.0–100.0)
Platelets: 196 10*3/uL (ref 150.0–400.0)
RBC: 4.68 Mil/uL (ref 4.22–5.81)
RDW: 12.7 % (ref 11.5–15.5)
WBC: 5.6 10*3/uL (ref 4.0–10.5)

## 2020-03-01 LAB — PSA: PSA: 1.53 ng/mL (ref 0.10–4.00)

## 2020-03-01 LAB — HEMOGLOBIN A1C: Hgb A1c MFr Bld: 5.3 % (ref 4.6–6.5)

## 2020-03-01 NOTE — Progress Notes (Signed)
William Curry is a 69 y.o. male presents to the office today for pneumovax23 njection, per physician's orders on office visit note from 02-23-20.  Pneumovax 23 was administered im on left deltoid today. Patient tolerated injection.   [name]

## 2020-03-01 NOTE — Progress Notes (Signed)
Patient was seen for physical on 11/4, labs received as below  Results for orders placed or performed in visit on 03/01/20  CBC  Result Value Ref Range   WBC 5.6 4.0 - 10.5 K/uL   RBC 4.68 4.22 - 5.81 Mil/uL   Platelets 196.0 150 - 400 K/uL   Hemoglobin 15.2 13.0 - 17.0 g/dL   HCT 45.1 39 - 52 %   MCV 96.2 78.0 - 100.0 fl   MCHC 33.7 30.0 - 36.0 g/dL   RDW 12.7 11.5 - 15.5 %  Comprehensive metabolic panel  Result Value Ref Range   Sodium 143 135 - 145 mEq/L   Potassium 4.4 3.5 - 5.1 mEq/L   Chloride 104 96 - 112 mEq/L   CO2 35 (H) 19 - 32 mEq/L   Glucose, Bld 92 70 - 99 mg/dL   BUN 18 6 - 23 mg/dL   Creatinine, Ser 0.94 0.40 - 1.50 mg/dL   Total Bilirubin 1.1 0.2 - 1.2 mg/dL   Alkaline Phosphatase 52 39 - 117 U/L   AST 17 0 - 37 U/L   ALT 14 0 - 53 U/L   Total Protein 6.9 6.0 - 8.3 g/dL   Albumin 4.2 3.5 - 5.2 g/dL   GFR 82.91 >60.00 mL/min   Calcium 9.0 8.4 - 10.5 mg/dL  Hemoglobin A1c  Result Value Ref Range   Hgb A1c MFr Bld 5.3 4.6 - 6.5 %  Lipid panel  Result Value Ref Range   Cholesterol 131 0 - 200 mg/dL   Triglycerides 42.0 0 - 149 mg/dL   HDL 40.10 >39.00 mg/dL   VLDL 8.4 0.0 - 40.0 mg/dL   LDL Cholesterol 82 0 - 99 mg/dL   Total CHOL/HDL Ratio 3    NonHDL 90.42   PSA  Result Value Ref Range   PSA 1.53 0.10 - 4.00 ng/mL

## 2020-03-02 LAB — BILIRUBIN, FRACTIONATED(TOT/DIR/INDIR)
Bilirubin, Direct: 0.3 mg/dL — ABNORMAL HIGH (ref 0.0–0.2)
Indirect Bilirubin: 0.8 mg/dL (calc) (ref 0.2–1.2)
Total Bilirubin: 1.1 mg/dL (ref 0.2–1.2)

## 2020-03-09 ENCOUNTER — Ambulatory Visit: Payer: BC Managed Care – PPO | Attending: Internal Medicine

## 2020-03-09 DIAGNOSIS — Z23 Encounter for immunization: Secondary | ICD-10-CM

## 2020-03-09 NOTE — Progress Notes (Signed)
   Covid-19 Vaccination Clinic  Name:  William Curry    MRN: [id number] DOB: [birth year]  03/09/2020  William Curry was observed post Covid-19 immunization for 15 minutes without incident. He was provided with Vaccine Information Sheet and instruction to access the V-Safe system.   William Curry was instructed to call 911 with any severe reactions post vaccine: Marland Kitchen Difficulty breathing  . Swelling of face and throat  . A fast heartbeat  . A bad rash all over body  . Dizziness and weakness   Immunizations Administered    No immunizations on file.

## 2020-03-21 ENCOUNTER — Other Ambulatory Visit: Payer: Self-pay | Admitting: Family Medicine

## 2020-03-21 DIAGNOSIS — R053 Chronic cough: Secondary | ICD-10-CM

## 2020-03-21 NOTE — Progress Notes (Signed)
I saw pts wife today- she notes that he continues to cough.  We have seen GI already and had a negative H pylori.  Will refer to pulmonology now

## 2020-05-01 ENCOUNTER — Encounter: Payer: Self-pay | Admitting: Pulmonary Disease

## 2020-05-01 ENCOUNTER — Ambulatory Visit (INDEPENDENT_AMBULATORY_CARE_PROVIDER_SITE_OTHER): Payer: 59 | Admitting: Pulmonary Disease

## 2020-05-01 ENCOUNTER — Other Ambulatory Visit: Payer: Self-pay

## 2020-05-01 VITALS — BP 118/70 | HR 81 | Temp 98.2°F | Ht 69.0 in | Wt 175.2 lb

## 2020-05-01 DIAGNOSIS — R059 Cough, unspecified: Secondary | ICD-10-CM | POA: Diagnosis not present

## 2020-05-01 DIAGNOSIS — J849 Interstitial pulmonary disease, unspecified: Secondary | ICD-10-CM

## 2020-05-01 MED ORDER — FLUTICASONE PROPIONATE 50 MCG/ACT NA SUSP: 1.0000 | Freq: Every day | NASAL | 2 refills | Status: DC

## 2020-05-01 MED ORDER — ALBUTEROL SULFATE HFA 108 (90 BASE) MCG/ACT IN AERS: 2.0000 | INHALATION_SPRAY | Freq: Four times a day (QID) | RESPIRATORY_TRACT | 6 refills | Status: DC | PRN

## 2020-05-01 NOTE — Patient Instructions (Addendum)
Use albuterol inhaler 1-2 puffs every 4-6 hours as needed for cough  Start fluticasone nasal spray, 1 spray per nostril daily  We will schedule you for CT Chest scan

## 2020-05-01 NOTE — Progress Notes (Signed)
Synopsis: Referred in 04/2020 for chronic cough by Dr. Lamar Blinks, MD  Subjective:   PATIENT ID: William Curry GENDER: male DOB: Jun 29, 1950, MRN: [id number]   HPI  Chief Complaint  Patient presents with  . Consult    Referred by Dr. Lorelei Pont for chronic cough for the past 2-3 years. Cough has gotten worse over the last year. Described as a productive cough with clear, thick mucus. Has noticed any color to phlegm. Denies any increased SOB associated with cough.    William Curry is a 70 year old male, never smoker with with history of GERD who is referred to pulmonary clinic for evaluation of chronic cough.   He reports having a cough since 2017 that has progressively worsened over the last 1 year. He does produce clear mucous at times. Has never cough up blood. He has been evaluated by GI and ENT for this cough in the past. He was started on pantoprazole over 1 year ago and he has not noticed any improvement in his cough, which he reports has worsened over the last year. He had laryngoscopy performed by ENT 10/10/19 which was unremarkable.   He denies any shortness of breath or wheezing with the cough. He denies overt heart burn or reflux symptoms. He denies seasonal allergies, sinus congestion or drainage.   He is a never smoker. He does have exposure to [employer] as he worked for [employer]. 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    Past Medical History:  Diagnosis Date  . DDD (degenerative disc disease), lumbar   . GERD (gastroesophageal reflux disease)   . 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 History of chicken pox   . Hypertension 2002-2005  . Kidney stone 1994-2010   Passed stone in 1994, Lithotripsy in 2010, stones passed in 2018  . Macular degeneration   . Scoliosis   . 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 Tinea  versicolor      Family History  Problem Relation Age of Onset  . Cancer Mother 37       Breast  . Diabetes Mother 58       on insulin  . Breast cancer Mother   . Diabetes Father 75       on insulin  . Bladder Cancer Father   . Colon cancer Maternal Grandfather   . Colon polyps Sister   . Esophageal cancer Neg Hx   . Rectal cancer Neg Hx   . Stomach cancer Neg Hx   . Prostate cancer Neg Hx      Social History   Socioeconomic History  . Marital status: Married    Spouse name: Not on file  . Number of children: Not on file  . Years of education: Not on file  . Highest education level: Not on file  Occupational History  . Occupation: Insurance underwriter / Accountant  Tobacco Use  . Smoking status: Never Smoker  . Smokeless tobacco: Former Systems developer    Types: Chew  . Tobacco comment: admits do doing chewing tabacco. stopped in 2005  Vaping Use  . Vaping Use: Never used  Substance and Sexual Activity  . Alcohol use: Yes    Alcohol/week: 6.0 standard drinks    Types: 6 Cans of beer per week    Comment: 4 - 6  beers a week  . Drug use: No  . Sexual activity: Not on file  Other Topics Concern  . 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 Penicillins Other (See Comments)    As a child     Outpatient Medications Prior to Visit  Medication Sig Dispense Refill  . Multiple Vitamin (MULTIVITAMIN) tablet Take 1 tablet by mouth daily.    . Multiple Vitamins-Minerals (PRESERVISION AREDS 2+MULTI VIT PO) Take 1 tablet 2 (two) times daily by mouth.    Marland Kitchen OVER  THE COUNTER MEDICATION CBD  Oil daily    . pantoprazole (PROTONIX) 40 MG tablet Take 1 tablet (40 mg total) by mouth daily. 90 tablet 3  . sildenafil (REVATIO) 20 MG tablet TAKE 3 TO 5 TABLETS BY MOUTH ONCE DAILY AS NEEDED 50 tablet 6  . terbinafine (LAMISIL) 1 % cream Apply topically 2 (two) times daily as needed.     . valACYclovir (VALTREX) 500 MG tablet Take 500 mg by mouth as needed.      No facility-administered medications prior to visit.    Review of Systems  Constitutional: Negative for chills, fever, malaise/fatigue and weight loss.  HENT: Negative for congestion, sinus pain and sore throat.   Eyes: Negative.   Respiratory: Positive for cough and sputum production. Negative for hemoptysis, shortness of breath and wheezing.   Cardiovascular: Negative for chest pain, palpitations, orthopnea, claudication, leg swelling and PND.  Gastrointestinal: Positive for heartburn. Negative for abdominal pain, diarrhea, nausea and vomiting.  Genitourinary: Negative.   Musculoskeletal: Negative for joint pain and myalgias.  Skin: Negative for rash.  Neurological: Negative for dizziness, weakness and headaches.  Endo/Heme/Allergies: Does not bruise/bleed easily.  Psychiatric/Behavioral: The patient is nervous/anxious.    Objective:   Vitals:   05/01/20 0924  BP: 118/70  Pulse: 81  Temp: 98.2 F (36.8 C)  TempSrc: Temporal  SpO2: 98%  Weight: 175 lb 3.2 oz (79.5 kg)  Height: 5\' 9"  (1.753 m)     Physical Exam Constitutional:      General: He is not in acute distress.    Appearance: Normal appearance. He is normal weight.  HENT:     Head: Normocephalic and atraumatic.     Nose: Nose normal.     Mouth/Throat:     Mouth: Mucous membranes are moist.     Pharynx: Oropharynx is clear.  Eyes:     General: No scleral icterus.    Conjunctiva/sclera: Conjunctivae normal.     Pupils: Pupils are equal, round, and reactive to light.  Cardiovascular:     Rate and Rhythm: Normal rate and  regular rhythm.     Pulses: Normal pulses.     Heart sounds: Normal heart sounds. No murmur heard.   Pulmonary:     Effort: Pulmonary effort is normal.     Breath sounds: Rales (bilateral bases) present. No wheezing or rhonchi.  Abdominal:     General: Bowel sounds are normal.     Palpations:  Abdomen is soft.  Musculoskeletal:     Right lower leg: No edema.     Left lower leg: No edema.  Lymphadenopathy:     Cervical: No cervical adenopathy.  Skin:    General: Skin is warm and dry.  Neurological:     General: No focal deficit present.     Mental Status: He is alert.  Psychiatric:        Mood and Affect: Mood normal.        Behavior: Behavior normal.        Thought Content: Thought content normal.        Judgment: Judgment normal.    CBC    Component Value Date/Time   WBC 5.6 03/01/2020 0817   RBC 4.68 03/01/2020 0817   HGB 15.2 03/01/2020 0817   HCT 45.1 03/01/2020 0817   PLT 196.0 03/01/2020 0817   MCV 96.2 03/01/2020 0817   MCHC 33.7 03/01/2020 0817   RDW 12.7 03/01/2020 0817   BMP Latest Ref Rng & Units 03/01/2020 08/10/2019 12/23/2018  Glucose 70 - 99 mg/dL 92 95 95  BUN 6 - 23 mg/dL 18 19 20   Creatinine 0.40 - 1.50 mg/dL 0.94 0.90 1.01  Sodium 135 - 145 mEq/L 143 138 140  Potassium 3.5 - 5.1 mEq/L 4.4 4.3 4.3  Chloride 96 - 112 mEq/L 104 102 103  CO2 19 - 32 mEq/L 35(H) 31 31  Calcium 8.4 - 10.5 mg/dL 9.0 9.0 9.2   Chest imaging: Chest Radiograph 05/04/2018 Cardiac shadow is enlarged. Pacing device is noted. The lungs are well aerated bilaterally with some minimal interstitial changes. No focal confluent infiltrate or effusion is seen. No bony abnormality is noted.  PFT: No flowsheet data found.    Assessment & Plan:   Cough  Interstitial pulmonary disease ([place]) - Plan: CT Chest High Resolution  Discussion: Sahas Sluka is a 70 year old male, never smoker who has been referred to pulmonary clinic for evaluation of chronic cough.   The etiology of his  cough is not certain at this time. He does have GERD and has been on pantoprazole therapy for over a year with out improvement in his cough. He does not appear to have sinus drainage involvement or other over symptoms that would be concerning for reactive airways disease.   We will trial him on fluticasone nasal spray daily and albuterol inhaler over the next month and monitor for any benefit in the cough.   He does have crackles on exam at the bilateral bases. We will obtain a high resolution CT chest to investigate any interstitial lung disease process.    Follow up in 3 months.  Freda Jackson, MD [facility] Office: [phone]   See Amion for Pager Details      Current Outpatient Medications:  .  albuterol (VENTOLIN HFA) 108 (90 Base) MCG/ACT inhaler, Inhale 2 puffs into the lungs every 6 (six) hours as needed for wheezing or shortness of breath., Disp: 8 g, Rfl: 6 .  fluticasone (FLONASE) 50 MCG/ACT nasal spray, Place 1 spray into both nostrils daily., Disp: 16 g, Rfl: 2 .  Multiple Vitamin (MULTIVITAMIN) tablet, Take 1 tablet by mouth daily., Disp: , Rfl:  .  Multiple Vitamins-Minerals (PRESERVISION AREDS 2+MULTI VIT PO), Take 1 tablet 2 (two) times daily by mouth., Disp: , Rfl:  .  OVER THE COUNTER MEDICATION, CBD  Oil daily, Disp: , Rfl:  .  pantoprazole (PROTONIX) 40 MG tablet, Take 1 tablet (40 mg  total) by mouth daily., Disp: 90 tablet, Rfl: 3 .  sildenafil (REVATIO) 20 MG tablet, TAKE 3 TO 5 TABLETS BY MOUTH ONCE DAILY AS NEEDED, Disp: 50 tablet, Rfl: 6 .  terbinafine (LAMISIL) 1 % cream, Apply topically 2 (two) times daily as needed. , Disp: , Rfl:  .  valACYclovir (VALTREX) 500 MG tablet, Take 500 mg by mouth as needed. , Disp: , Rfl:

## 2020-05-06 DIAGNOSIS — I471 Supraventricular tachycardia: Secondary | ICD-10-CM | POA: Insufficient documentation

## 2020-05-06 DIAGNOSIS — I453 Trifascicular block: Secondary | ICD-10-CM | POA: Insufficient documentation

## 2020-05-07 ENCOUNTER — Telehealth (INDEPENDENT_AMBULATORY_CARE_PROVIDER_SITE_OTHER): Payer: 59 | Admitting: Internal Medicine

## 2020-05-07 ENCOUNTER — Telehealth: Payer: Self-pay | Admitting: *Deleted

## 2020-05-07 ENCOUNTER — Encounter: Payer: Self-pay | Admitting: Internal Medicine

## 2020-05-07 ENCOUNTER — Other Ambulatory Visit: Payer: Self-pay

## 2020-05-07 VITALS — Ht 69.0 in | Wt 175.2 lb

## 2020-05-07 DIAGNOSIS — I442 Atrioventricular block, complete: Secondary | ICD-10-CM | POA: Diagnosis not present

## 2020-05-07 DIAGNOSIS — I471 Supraventricular tachycardia: Secondary | ICD-10-CM

## 2020-05-07 DIAGNOSIS — I453 Trifascicular block: Secondary | ICD-10-CM

## 2020-05-07 DIAGNOSIS — Z950 Presence of cardiac pacemaker: Secondary | ICD-10-CM | POA: Diagnosis not present

## 2020-05-07 DIAGNOSIS — I429 Cardiomyopathy, unspecified: Secondary | ICD-10-CM

## 2020-05-07 NOTE — Telephone Encounter (Signed)
  Patient Consent for Virtual Visit         William Curry has provided verbal consent on 05/07/2020 for a virtual visit (video or telephone).   CONSENT FOR VIRTUAL VISIT FOR:  William Curry  By participating in this virtual visit I agree to the following:  I hereby voluntarily request, consent and authorize [facility] and its employed or contracted physicians, physician assistants, nurse practitioners or other licensed health care professionals (the Practitioner), to provide me with telemedicine health care services (the "Services") as deemed necessary by the treating Practitioner. I acknowledge and consent to receive the Services by the Practitioner via telemedicine. I understand that the telemedicine visit will involve communicating with the Practitioner through live audiovisual communication technology and the disclosure of certain medical information by electronic transmission. I acknowledge that I have been given the opportunity to request an in-person assessment or other available alternative prior to the telemedicine visit and am voluntarily participating in the telemedicine visit.  I understand that I have the right to withhold or withdraw my consent to the use of telemedicine in the course of my care at any time, without affecting my right to future care or treatment, and that the Practitioner or I may terminate the telemedicine visit at any time. I understand that I have the right to inspect all information obtained and/or recorded in the course of the telemedicine visit and may receive copies of available information for a reasonable fee.  I understand that some of the potential risks of receiving the Services via telemedicine include:  Marland Kitchen Delay or interruption in medical evaluation due to technological equipment failure or disruption; . Information transmitted may not be sufficient (e.g. poor resolution of images) to allow for appropriate medical decision making by the Practitioner; and/or   . In rare instances, security protocols could fail, causing a breach of personal health information.  Furthermore, I acknowledge that it is my responsibility to provide information about my medical history, conditions and care that is complete and accurate to the best of my ability. I acknowledge that Practitioner's advice, recommendations, and/or decision may be based on factors not within their control, such as incomplete or inaccurate data provided by me or distortions of diagnostic images or specimens that may result from electronic transmissions. I understand that the practice of medicine is not an exact science and that Practitioner makes no warranties or guarantees regarding treatment outcomes. I acknowledge that a copy of this consent can be made available to me via my patient portal ([facility]), or I can request a printed copy by calling the office of [facility].    I understand that my insurance will be billed for this visit.   I have read or had this consent read to me. . I understand the contents of this consent, which adequately explains the benefits and risks of the Services being provided via telemedicine.  . I have been provided ample opportunity to ask questions regarding this consent and the Services and have had my questions answered to my satisfaction. . I give my informed consent for the services to be provided through the use of telemedicine in my medical care

## 2020-05-07 NOTE — Progress Notes (Signed)
Electrophysiology TeleHealth Note   Due to national recommendations of social distancing due to COVID 19, an audio/video telehealth visit is felt to be most appropriate for this patient at this time.  See MyChart message from today for the patient's consent to telehealth for [name].   Date:  05/07/2020   ID:  William Curry, DOB [birth year], MRN [id number]  Location: patient's home  Provider location: [address] [place]  Evaluation Performed: Follow-up visit  PCP:  Copland, Gay Filler, MD  Cardiologist:    Electrophysiologist:  SK   Chief Complaint:  Complete heart block intermittent   History of Present Illness:    William Curry is a 70 y.o. male who presents via audio/video conferencing for a telehealth visit today.  Since last being seen in our clinic for intermittent complete heart block and pacing the patient reports The patient denies chest pain , shortness of breath , nocturnal dyspnea , orthopnea  or peripheral edema* *.  There have been no palpitations , lightheadedness or syncope .   multiuple questions regarding his PM interrogation-- battery deterioration slope etc    DATE PR QRS Pacing %  2/16   16  4/17 228 138 15  4/18 218 142 10  4/19 250 140 23  4/20    73   DATE TEST EF   1/13 Echo  60-65 %           The patient denies symptoms of fevers, chills, cough, or new SOB worrisome for COVID 19.    Past Medical History:  Diagnosis Date  . DDD (degenerative disc disease), lumbar   . GERD (gastroesophageal reflux disease)   . 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 History of chicken pox   . Hypertension 2002-2005  . Kidney stone 1994-2010   Passed stone in 1994, Lithotripsy in 2010, stones passed in 2018  . Macular degeneration   . Scoliosis   . 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 Tinea versicolor     Past Surgical History:  Procedure Laterality Date  . CARTILAGE REPAIR  Nov.-1989   Left knee-HARD  TO WAKE UP AFTER SX  . COLONOSCOPY    . KNEE ARTHROSCOPY WITH ANTERIOR CRUCIATE LIGAMENT (ACL) REPAIR  [phone]   Left knee-HARD TO WAKE UP AFTER SX  . LITHOTRIPSY  Jun-2010   Kidney stone (1st stone 1994 - Passed)  . PACEMAKER INSTALLATION  Jan-2013   Complete Heart Block  . SKIN BIOPSY     RIght knee for skin cancer    Current Outpatient Medications  Medication Sig Dispense Refill  . albuterol (VENTOLIN HFA) 108 (90 Base) MCG/ACT inhaler Inhale 2 puffs into the lungs every 6 (six) hours as needed for wheezing or shortness of breath. 8 g 6  . fluticasone (FLONASE) 50 MCG/ACT nasal spray Place 1 spray into both nostrils daily. 16 g 2  . Multiple Vitamin (MULTIVITAMIN) tablet Take 1 tablet by mouth daily.    . Multiple Vitamins-Minerals (PRESERVISION AREDS 2+MULTI VIT PO) Take 1 tablet 2 (two) times daily by mouth.    Marland Kitchen OVER THE COUNTER MEDICATION CBD  Oil daily    . pantoprazole (PROTONIX) 40 MG tablet Take 1 tablet (40 mg total) by mouth daily. 90 tablet 3  . sildenafil (REVATIO) 20 MG tablet TAKE 3 TO 5 TABLETS BY MOUTH ONCE DAILY AS NEEDED 50 tablet 6  . terbinafine (LAMISIL) 1 % cream Apply topically 2 (  two) times daily as needed.     . valACYclovir (VALTREX) 500 MG tablet Take 500 mg by mouth as needed.      No current facility-administered medications for this visit.    Allergies:   Penicillins   Social History:  The patient  reports that he has never smoked. He has quit using smokeless tobacco.  His smokeless tobacco use included chew. He reports current alcohol use of about 6.0 standard drinks of alcohol per week. He reports that he does not use drugs.   Family History:  The patient's   family history includes Bladder Cancer in his father; Breast cancer in his mother; Cancer (age of onset: 63) in his mother; Colon cancer in his maternal grandfather; Colon polyps in his sister; Diabetes (age of onset: 29) in his father and mother.   ROS:  Please see the history of present  illness.   All other systems are personally reviewed and negative.    Exam:    Vital Signs:  Ht 5\' 9"  (1.753 m)   Wt 175 lb 3.2 oz (79.5 kg)   BMI 25.87 kg/m    Well appearing, alert and conversant, regular work of breathing,  good skin color Eyes- anicteric, neuro- grossly intact, skin- no apparent rash or lesions or cyanosis, mouth- oral mucosa is pink   Labs/Other Tests and Data Reviewed:    Recent Labs: 03/01/2020: ALT 14; BUN 18; Creatinine, Ser 0.94; Hemoglobin 15.2; Platelets 196.0; Potassium 4.4; Sodium 143   Wt Readings from Last 3 Encounters:  05/07/20 175 lb 3.2 oz (79.5 kg)  05/01/20 175 lb 3.2 oz (79.5 kg)  02/23/20 173 lb (78.5 kg)     Other studies personally reviewed:    Last device remote is reviewed from [facility] PDF dated 10/21  which reveals normal device function,   arrhythmias - none     ASSESSMENT & PLAN:    Atrial tach  Intermittent complete heart block  Trifascicular block    Pacemaker  St Jude  No symptoms to suggest cardiomyopathy but will need echo  VP% about 80 % (unchanged over last 3 years)   ERI in 1.7 y  Discussed battery behaviour    COVID 19 screen The patient denies symptoms of COVID 19 at this time.  The importance of social distancing was discussed today.  Follow-up:  July  with echo    Current medicines are reviewed at length with the patient today.   The patient does not have concerns regarding his medicines.  The following changes were made today:  none  Labs/ tests ordered today include:echo at revisit No orders of the defined types were placed in this encounter.   Future tests ( post COVID )     Patient Risk:  after full review of this patients clinical status, I feel that they are at moderate*  risk at this time.  Today, I have spent17 minutes with the patient with telehealth technology discussing the above. Signed, Virl Axe, MD  05/07/2020 10:37 AM     [address] [place] [address] [phone] (office) [phone] (fax)

## 2020-05-07 NOTE — Patient Instructions (Signed)
Medication Instructions:  Your physician recommends that you continue on your current medications as directed. Please refer to the Current Medication list given to you today.  *If you need a refill on your cardiac medications before your next appointment, please call your pharmacy*   Lab Work: None ordered.  If you have labs (blood work) drawn today and your tests are completely normal, you will receive your results only by: Marland Kitchen MyChart Message (if you have MyChart) OR . A paper copy in the mail If you have any lab test that is abnormal or we need to change your treatment, we will call you to review the results.   Testing/Procedures: Your physician has requested that you have an echocardiogram. Echocardiography is a painless test that uses sound waves to create images of your heart. It provides your doctor with information about the size and shape of your heart and how well your heart's chambers and valves are working. This procedure takes approximately one hour. There are no restrictions for this procedure.    Follow-Up: At [facility], you and your health needs are our priority.  As part of our continuing mission to provide you with exceptional heart care, we have created designated Provider Care Teams.  These Care Teams include your primary Cardiologist (physician) and Advanced Practice Providers (APPs -  Physician Assistants and Nurse Practitioners) who all work together to provide you with the care you need, when you need it.  We recommend signing up for the patient portal called "MyChart".  Sign up information is provided on this After Visit Summary.  MyChart is used to connect with patients for Virtual Visits (Telemedicine).  Patients are able to view lab/test results, encounter notes, upcoming appointments, etc.  Non-urgent messages can be sent to your provider as well.   To learn more about what you can do with MyChart, go to [email or web address].    Your next appointment:     ECHO- 11/06/2020 at 1245pm at [facility] office.  Visit with Dr Caryl Comes 11/06/2020 at 145pm at [facility] office

## 2020-05-14 ENCOUNTER — Other Ambulatory Visit: Payer: Self-pay

## 2020-05-14 ENCOUNTER — Ambulatory Visit (HOSPITAL_BASED_OUTPATIENT_CLINIC_OR_DEPARTMENT_OTHER)
Admission: RE | Admit: 2020-05-14 | Discharge: 2020-05-14 | Disposition: A | Discharge status: Home / Self Care | Payer: 59 | Source: Ambulatory Visit | Attending: Pulmonary Disease | Admitting: Pulmonary Disease

## 2020-05-14 DIAGNOSIS — J849 Interstitial pulmonary disease, unspecified: Secondary | ICD-10-CM | POA: Insufficient documentation

## 2020-05-16 ENCOUNTER — Ambulatory Visit (INDEPENDENT_AMBULATORY_CARE_PROVIDER_SITE_OTHER): Payer: 59

## 2020-05-16 DIAGNOSIS — I442 Atrioventricular block, complete: Secondary | ICD-10-CM | POA: Diagnosis not present

## 2020-05-16 LAB — CUP PACEART REMOTE DEVICE CHECK
Battery Remaining Longevity: 12 mo
Battery Remaining Percentage: 10 %
Battery Voltage: 2.71 V
Brady Statistic AP VP Percent: 12 %
Brady Statistic AP VS Percent: 6.6 %
Brady Statistic AS VP Percent: 67 %
Brady Statistic AS VS Percent: 14 %
Brady Statistic RA Percent Paced: 19 %
Brady Statistic RV Percent Paced: 79 %
Date Time Interrogation Session: 20220126035705
Implantable Lead Implant Date: 20130115
Implantable Lead Implant Date: 20130115
Implantable Lead Location: 753859
Implantable Lead Location: 753860
Implantable Pulse Generator Implant Date: 20130115
Lead Channel Impedance Value: 350 Ohm
Lead Channel Impedance Value: 360 Ohm
Lead Channel Pacing Threshold Amplitude: 0.5 V
Lead Channel Pacing Threshold Amplitude: 1.625 V
Lead Channel Pacing Threshold Pulse Width: 0.4 ms
Lead Channel Pacing Threshold Pulse Width: 0.4 ms
Lead Channel Sensing Intrinsic Amplitude: 12 mV
Lead Channel Sensing Intrinsic Amplitude: 3 mV
Lead Channel Setting Pacing Amplitude: 1.5 V
Lead Channel Setting Pacing Amplitude: 1.875
Lead Channel Setting Pacing Pulse Width: 0.4 ms
Lead Channel Setting Sensing Sensitivity: 0.5 mV
Pulse Gen Model: 2210
Pulse Gen Serial Number: 7304172

## 2020-05-24 ENCOUNTER — Other Ambulatory Visit: Payer: Self-pay | Admitting: Gastroenterology

## 2020-05-24 MED ORDER — PANTOPRAZOLE SODIUM 20 MG PO TBEC: 20.0000 mg | DELAYED_RELEASE_TABLET | Freq: Every day | ORAL | 6 refills | Status: DC

## 2020-05-28 NOTE — Progress Notes (Signed)
Remote pacemaker transmission.   

## 2020-06-07 ENCOUNTER — Telehealth: Payer: Self-pay | Admitting: Pulmonary Disease

## 2020-06-07 NOTE — Telephone Encounter (Signed)
Called and spoke with patient to get him scheduled for PFT and OV. Patient is scheduled for PFT at 12 pm and OV at 2pm on 06/21/20 with Dr. Erin Fulling. Covid test has been set up for 2/28. Will route to Dr. Erin Fulling as Juluis Rainier. Nothing further needed at this time.

## 2020-06-07 NOTE — Telephone Encounter (Signed)
Please schedule patient for full PFTs pre/post and office visit on 06/20/20. The reason for PFTs is ILD.   I have spoken to the patient about his HRCT chest results and this is the reason for scheduling a visit sooner.   He will be expecting a call with the date/time of the PFT and office visit appointment.   Thanks, Wille Glaser

## 2020-06-18 ENCOUNTER — Other Ambulatory Visit (HOSPITAL_COMMUNITY)
Admission: RE | Admit: 2020-06-18 | Discharge: 2020-06-18 | Disposition: A | Discharge status: Home / Self Care | Payer: 59 | Source: Ambulatory Visit | Attending: Pulmonary Disease | Admitting: Pulmonary Disease

## 2020-06-18 DIAGNOSIS — Z20822 Contact with and (suspected) exposure to covid-19: Secondary | ICD-10-CM | POA: Insufficient documentation

## 2020-06-18 DIAGNOSIS — Z01812 Encounter for preprocedural laboratory examination: Secondary | ICD-10-CM | POA: Diagnosis present

## 2020-06-18 LAB — SARS CORONAVIRUS 2 (TAT 6-24 HRS): SARS Coronavirus 2: NEGATIVE

## 2020-06-21 ENCOUNTER — Other Ambulatory Visit: Payer: Self-pay | Admitting: Pulmonary Disease

## 2020-06-21 ENCOUNTER — Ambulatory Visit (INDEPENDENT_AMBULATORY_CARE_PROVIDER_SITE_OTHER): Payer: 59 | Admitting: Pulmonary Disease

## 2020-06-21 ENCOUNTER — Encounter: Payer: Self-pay | Admitting: Pulmonary Disease

## 2020-06-21 ENCOUNTER — Ambulatory Visit: Payer: 59 | Admitting: Pulmonary Disease

## 2020-06-21 ENCOUNTER — Other Ambulatory Visit: Payer: Self-pay

## 2020-06-21 VITALS — BP 124/76 | HR 83 | Temp 98.4°F | Ht 64.0 in | Wt 173.0 lb

## 2020-06-21 DIAGNOSIS — J849 Interstitial pulmonary disease, unspecified: Secondary | ICD-10-CM

## 2020-06-21 LAB — PULMONARY FUNCTION TEST
DL/VA % pred: 144 %
DL/VA: 5.91 ml/min/mmHg/L
DLCO cor % pred: 89 %
DLCO cor: 22.53 ml/min/mmHg
DLCO unc % pred: 89 %
DLCO unc: 22.53 ml/min/mmHg
FEF 25-75 Post: 5.62 L/sec
FEF 25-75 Pre: 5.64 L/sec
FEF2575-%Change-Post: 0 %
FEF2575-%Pred-Post: 230 %
FEF2575-%Pred-Pre: 231 %
FEV1-%Change-Post: 0 %
FEV1-%Pred-Post: 86 %
FEV1-%Pred-Pre: 86 %
FEV1-Post: 2.74 L
FEV1-Pre: 2.75 L
FEV1FVC-%Change-Post: 2 %
FEV1FVC-%Pred-Pre: 125 %
FEV6-%Change-Post: -3 %
FEV6-%Pred-Post: 70 %
FEV6-%Pred-Pre: 73 %
FEV6-Post: 2.87 L
FEV6-Pre: 2.96 L
FEV6FVC-%Pred-Post: 105 %
FEV6FVC-%Pred-Pre: 105 %
FVC-%Change-Post: -3 %
FVC-%Pred-Post: 66 %
FVC-%Pred-Pre: 68 %
FVC-Post: 2.87 L
FVC-Pre: 2.96 L
Post FEV1/FVC ratio: 95 %
Post FEV6/FVC ratio: 100 %
Pre FEV1/FVC ratio: 93 %
Pre FEV6/FVC Ratio: 100 %
RV % pred: 50 %
RV: 1.2 L
TLC % pred: 58 %
TLC: 4.02 L

## 2020-06-21 NOTE — Patient Instructions (Signed)
Continue flonase nasal spray, 1 spray per nostril daily or every other day.  Can use saline nasal spray 1 to 2 times per day to add moisture to the nasal passages. Saline nasal spray can be picked up over the counter.   We will draw labs today and discuss you case at the upcoming interstitial lung disease conference and give you a call after this meeting.

## 2020-06-21 NOTE — Progress Notes (Signed)
PFT done today. 

## 2020-06-21 NOTE — Progress Notes (Signed)
Synopsis: Referred in 04/2020 for chronic cough by Dr. Lamar Blinks, MD  Subjective:   PATIENT ID: William Curry GENDER: male DOB: 1950/07/29, MRN: [id number]  Cough improved with nasal spray   HPI  Chief Complaint  Patient presents with   Follow-up    Cough is the same. Albuterol and nasal spray are working well.   William Curry is a 70 year old male, never smoker with with history of GERD who returns to pulmonary clinic for evaluation of chronic cough.   His HRCT Chest on 05/14/20 showed subpleural reticulation, ground-glass, traction bronchiectasis/bronchiolectasis and probable honeycombing with upper/midlung involvement.   His PFTs today show moderate restrictive defect with normal DLCO.   He reports his cough is somewhat better after starting flonase daily for post-nasal drainage.   OV 05/01/20: He reports having a cough since 2017 that has progressively worsened over the last 1 year. He does produce clear mucous at times. Has never cough up blood. He has been evaluated by GI and ENT for this cough in the past. He was started on pantoprazole over 1 year ago and he has not noticed any improvement in his cough, which he reports has worsened over the last year. He had laryngoscopy performed by ENT 10/10/19 which was unremarkable.   He denies any shortness of breath or wheezing with the cough. He denies overt heart burn or reflux symptoms. He denies seasonal allergies, sinus congestion or drainage.   He is a never smoker. He does have exposure to [employer] as he worked for [employer]. 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    He has a nephew with cystic fibrosis.   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 Right Knee   Tinea versicolor      Family History  Problem Relation Age of Onset   Cancer Mother 61       Breast   Diabetes Mother 38       on insulin   Breast cancer Mother    Diabetes Father 28       on insulin   Bladder Cancer Father    Colon cancer Maternal Grandfather    Colon polyps Sister    Esophageal cancer Neg Hx    Rectal cancer Neg Hx    Stomach cancer Neg Hx    Prostate cancer Neg Hx      Social History   Socioeconomic History   Marital status: Married    Spouse name: Not on file   Number of children: Not on file   Years of education: Not on file   Highest education level: Not on file  Occupational History   Occupation: Insurance underwriter / Accountant  Tobacco Use   Smoking status: Never Smoker   Smokeless tobacco: Former Systems developer    Types: Chew   Tobacco comment: admits do doing chewing tabacco. stopped in 2005  Vaping Use   Vaping Use: Never used  Substance and  Sexual Activity   Alcohol use: Yes    Alcohol/week: 6.0 standard drinks    Types: 6 Cans of beer per week    Comment: 4 - 6 beers a week   Drug use: No   Sexual activity: Not on file  Other Topics Concern   Not on file  Social History Narrative   Formerly with [employer], prev did some accounting work   To [place] 2013   Remarried 2007   [employer] '71-'75   Social Determinants of Health   Financial Resource Strain: Not on file  Food Insecurity: Not on file  Transportation Needs: Not on file  Physical Activity: Not on file  Stress: Not on file  Social Connections: Not on file  Intimate Partner Violence: Not on file     Allergies  Allergen Reactions   Penicillins Other (See Comments)    As a child     Outpatient Medications  Prior to Visit  Medication Sig Dispense Refill   albuterol (VENTOLIN HFA) 108 (90 Base) MCG/ACT inhaler Inhale 2 puffs into the lungs every 6 (six) hours as needed for wheezing or shortness of breath. 8 g 6   fluticasone (FLONASE) 50 MCG/ACT nasal spray Place 1 spray into both nostrils daily. 16 g 2   Multiple Vitamin (MULTIVITAMIN) tablet Take 1 tablet by mouth daily.     Multiple Vitamins-Minerals (PRESERVISION AREDS 2+MULTI VIT PO) Take 1 tablet 2 (two) times daily by mouth.     OVER THE COUNTER MEDICATION CBD  Oil daily     pantoprazole (PROTONIX) 20 MG tablet Take 1 tablet (20 mg total) by mouth daily. 30 tablet 6   sildenafil (REVATIO) 20 MG tablet TAKE 3 TO 5 TABLETS BY MOUTH ONCE DAILY AS NEEDED 50 tablet 6   terbinafine (LAMISIL) 1 % cream Apply topically 2 (two) times daily as needed.      valACYclovir (VALTREX) 500 MG tablet Take 500 mg by mouth as needed.      pantoprazole (PROTONIX) 40 MG tablet Take 1 tablet (40 mg total) by mouth daily. (Patient not taking: Reported on 06/21/2020) 90 tablet 3   No facility-administered medications prior to visit.    Review of Systems  Constitutional: Negative for chills, fever, malaise/fatigue and weight loss.  HENT: Negative for congestion, sinus pain and sore throat.   Eyes: Negative.   Respiratory: Positive for cough and sputum production. Negative for hemoptysis, shortness of breath and wheezing.   Cardiovascular: Negative for chest pain, palpitations, orthopnea, claudication, leg swelling and PND.  Gastrointestinal: Positive for heartburn. Negative for abdominal pain, diarrhea, nausea and vomiting.  Genitourinary: Negative.   Musculoskeletal: Negative for joint pain and myalgias.  Skin: Negative for rash.  Neurological: Negative for dizziness, weakness and headaches.  Endo/Heme/Allergies: Does not bruise/bleed easily.  Psychiatric/Behavioral: The patient is nervous/anxious.    Objective:   Vitals:   06/21/20 1358  BP:  124/76  Pulse: 83  Temp: 98.4 F (36.9 C)  SpO2: 97%  Weight: 173 lb (78.5 kg)  Height: 5\' 4"  (1.626 m)    Physical Exam Constitutional:      General: He is not in acute distress.    Appearance: Normal appearance. He is normal weight.  HENT:     Head: Normocephalic and atraumatic.     Nose: Nose normal.     Mouth/Throat:     Mouth: Mucous membranes are moist.     Pharynx: Oropharynx is clear.  Eyes:  General: No scleral icterus.    Conjunctiva/sclera: Conjunctivae normal.     Pupils: Pupils are equal, round, and reactive to light.  Cardiovascular:     Rate and Rhythm: Normal rate and regular rhythm.     Pulses: Normal pulses.     Heart sounds: Normal heart sounds. No murmur heard.   Pulmonary:     Effort: Pulmonary effort is normal.     Breath sounds: Rales (bilateral bases) present. No wheezing or rhonchi.  Abdominal:     General: Bowel sounds are normal.     Palpations: Abdomen is soft.  Musculoskeletal:     Right lower leg: No edema.     Left lower leg: No edema.  Lymphadenopathy:     Cervical: No cervical adenopathy.  Skin:    General: Skin is warm and dry.  Neurological:     General: No focal deficit present.     Mental Status: He is alert.  Psychiatric:        Mood and Affect: Mood normal.        Behavior: Behavior normal.        Thought Content: Thought content normal.        Judgment: Judgment normal.    CBC    Component Value Date/Time   WBC 5.6 03/01/2020 0817   RBC 4.68 03/01/2020 0817   HGB 15.2 03/01/2020 0817   HCT 45.1 03/01/2020 0817   PLT 196.0 03/01/2020 0817   MCV 96.2 03/01/2020 0817   MCHC 33.7 03/01/2020 0817   RDW 12.7 03/01/2020 0817   BMP Latest Ref Rng & Units 03/01/2020 08/10/2019 12/23/2018  Glucose 70 - 99 mg/dL 92 95 95  BUN 6 - 23 mg/dL 18 19 20   Creatinine 0.40 - 1.50 mg/dL 0.94 0.90 1.01  Sodium 135 - 145 mEq/L 143 138 140  Potassium 3.5 - 5.1 mEq/L 4.4 4.3 4.3  Chloride 96 - 112 mEq/L 104 102 103  CO2 19 - 32  mEq/L 35(H) 31 31  Calcium 8.4 - 10.5 mg/dL 9.0 9.0 9.2   Chest imaging: HRCT Chest 05/14/20 1. Pulmonary parenchymal pattern of fibrosis may be due to nonspecific interstitial pneumonitis. Usual interstitial pneumonitis is not excluded. Findings are indeterminate for UIP per consensus guidelines: Diagnosis of Idiopathic Pulmonary Fibrosis: An Official ATS/ERS/JRS/ALAT Clinical Practice Guideline. [place], Iss 5, [phone], Dec 20 2016. 2. Small to borderline enlarged mediastinal lymph nodes can be seen in the setting of interstitial lung disease. 3. Right renal stone.  Chest Radiograph 05/04/2018 Cardiac shadow is enlarged. Pacing device is noted. The lungs are well aerated bilaterally with some minimal interstitial changes. No focal confluent infiltrate or effusion is seen. No bony abnormality is noted.  PFT: PFT Results Latest Ref Rng & Units 06/21/2020  FVC-Pre L 2.96  FVC-Predicted Pre % 68  FVC-Post L 2.87  FVC-Predicted Post % 66  Pre FEV1/FVC % % 93  Post FEV1/FCV % % 95  FEV1-Pre L 2.75  FEV1-Predicted Pre % 86  FEV1-Post L 2.74  DLCO uncorrected ml/min/mmHg 22.53  DLCO UNC% % 89  DLCO corrected ml/min/mmHg 22.53  DLCO COR %Predicted % 89  DLVA Predicted % 144  TLC L 4.02  TLC % Predicted % 58  RV % Predicted % 50      Assessment & Plan:   ILD (interstitial lung disease) ([place]) - Plan: ANA,IFA RA Diag Pnl w/rflx Tit/Patn, Sjogrens syndrome-A extractable nuclear antibody, Sjogrens syndrome-B extractable nuclear antibody, ANCA Screen Reflex Titer, Anti-Scleroderma  Antibody, MyoMarker 3 Plus Profile (RDL), MyoMarker 3 Plus Profile (RDL), Anti-Scleroderma Antibody, ANCA Screen Reflex Titer, Sjogrens syndrome-B extractable nuclear antibody, Sjogrens syndrome-A extractable nuclear antibody, ANA,IFA RA Diag Pnl w/rflx Tit/Patn  Discussion: William Curry is a 70 year old male, never smoker who has been referred to pulmonary clinic for evaluation of  chronic cough.   The etiology of his cough appears related to post-nasal drainage as he has notable improvement since using fluticasone nasal spray and underlying interstitial lung disease concerning for pulmonary fibrosis. The radiologic pattern is indeterminate for usual interstitial pneumonia.   I will check serologic inflammatory labs today and have him fill out the ILD questionnaire. I will discuss his case at the upcoming ILD conference and get the teams input if it is reasonable to consider starting anti-fibrotic therapy.   Follow up in 2 months.  Freda [place], MD [facility] Office: [phone]   Current Outpatient Medications:    albuterol (VENTOLIN HFA) 108 (90 Base) MCG/ACT inhaler, Inhale 2 puffs into the lungs every 6 (six) hours as needed for wheezing or shortness of breath., Disp: 8 g, Rfl: 6   fluticasone (FLONASE) 50 MCG/ACT nasal spray, Place 1 spray into both nostrils daily., Disp: 16 g, Rfl: 2   Multiple Vitamin (MULTIVITAMIN) tablet, Take 1 tablet by mouth daily., Disp: , Rfl:    Multiple Vitamins-Minerals (PRESERVISION AREDS 2+MULTI VIT PO), Take 1 tablet 2 (two) times daily by mouth., Disp: , Rfl:    OVER THE COUNTER MEDICATION, CBD  Oil daily, Disp: , Rfl:    pantoprazole (PROTONIX) 20 MG tablet, Take 1 tablet (20 mg total) by mouth daily., Disp: 30 tablet, Rfl: 6   sildenafil (REVATIO) 20 MG tablet, TAKE 3 TO 5 TABLETS BY MOUTH ONCE DAILY AS NEEDED, Disp: 50 tablet, Rfl: 6   terbinafine (LAMISIL) 1 % cream, Apply topically 2 (two) times daily as needed. , Disp: , Rfl:    valACYclovir (VALTREX) 500 MG tablet, Take 500 mg by mouth as needed. , Disp: , Rfl:    pantoprazole (PROTONIX) 40 MG tablet, Take 1 tablet (40 mg total) by mouth daily. (Patient not taking: Reported on 06/21/2020), Disp: 90 tablet, Rfl: 3

## 2020-06-22 ENCOUNTER — Encounter: Payer: Self-pay | Admitting: Pulmonary Disease

## 2020-06-25 LAB — ANA,IFA RA DIAG PNL W/RFLX TIT/PATN
Anti Nuclear Antibody (ANA): NEGATIVE
Cyclic Citrullin Peptide Ab: <16 UNITS
Rheumatoid fact SerPl-aCnc: <14 IU/mL (ref <14)

## 2020-06-25 LAB — SJOGRENS SYNDROME-B EXTRACTABLE NUCLEAR ANTIBODY: SSB (La) (ENA) Antibody, IgG: <1 AI

## 2020-06-25 LAB — ANTI-SCLERODERMA ANTIBODY: Scleroderma (Scl-70) (ENA) Antibody, IgG: <1 AI

## 2020-06-25 LAB — ANCA SCREEN W REFLEX TITER: ANCA Screen: NEGATIVE

## 2020-06-25 LAB — SJOGRENS SYNDROME-A EXTRACTABLE NUCLEAR ANTIBODY: SSA (Ro) (ENA) Antibody, IgG: <1 AI

## 2020-06-26 ENCOUNTER — Telehealth: Payer: Self-pay | Admitting: Pulmonary Disease

## 2020-06-26 NOTE — Telephone Encounter (Signed)
ILD packet placed in Dr. August Albino review folder. Will forward to Dr. August Albino nurse to follow up on

## 2020-06-26 NOTE — Telephone Encounter (Signed)
Thank you Jon!

## 2020-07-09 LAB — MYOMARKER 3 PLUS PROFILE (RDL)

## 2020-07-21 ENCOUNTER — Other Ambulatory Visit: Payer: Self-pay | Admitting: Pulmonary Disease

## 2020-07-27 ENCOUNTER — Ambulatory Visit: Payer: 59 | Admitting: Pulmonary Disease

## 2020-08-15 LAB — CUP PACEART REMOTE DEVICE CHECK
Battery Remaining Longevity: 6 mo
Battery Remaining Percentage: 5 %
Battery Voltage: 2.66 V
Brady Statistic AP VP Percent: 13 %
Brady Statistic AP VS Percent: 6 %
Brady Statistic AS VP Percent: 69 %
Brady Statistic AS VS Percent: 13 %
Brady Statistic RA Percent Paced: 18 %
Brady Statistic RV Percent Paced: 81 %
Date Time Interrogation Session: 20220427072559
Implantable Lead Implant Date: 20130115
Implantable Lead Implant Date: 20130115
Implantable Lead Location: 753859
Implantable Lead Location: 753860
Implantable Pulse Generator Implant Date: 20130115
Lead Channel Impedance Value: 360 Ohm
Lead Channel Impedance Value: 390 Ohm
Lead Channel Pacing Threshold Amplitude: 0.375 V
Lead Channel Pacing Threshold Amplitude: 2 V
Lead Channel Pacing Threshold Pulse Width: 0.4 ms
Lead Channel Pacing Threshold Pulse Width: 0.4 ms
Lead Channel Sensing Intrinsic Amplitude: 3.3 mV
Lead Channel Sensing Intrinsic Amplitude: 5.2 mV
Lead Channel Setting Pacing Amplitude: 1.375
Lead Channel Setting Pacing Amplitude: 2.25 V
Lead Channel Setting Pacing Pulse Width: 0.4 ms
Lead Channel Setting Sensing Sensitivity: 0.5 mV
Pulse Gen Model: 2210
Pulse Gen Serial Number: 7304172

## 2020-08-16 ENCOUNTER — Telehealth: Payer: Self-pay

## 2020-08-16 NOTE — Telephone Encounter (Signed)
[facility] alert received for 2 ventricular lead noise reversions detected. Patient had this last noted in 01/2018. Patient was unaware of being around any items that could cause the noise. Questionable that EGM does not refect noise.   Patient called and advised of increase in monthly battery checks and dates provided. Monthly checks increased in Epic and [place].   Will route to Dr. Caryl Comes for review.

## 2020-08-18 NOTE — Telephone Encounter (Signed)
Noted  

## 2020-08-27 ENCOUNTER — Encounter: Payer: Self-pay | Admitting: Pulmonary Disease

## 2020-08-27 ENCOUNTER — Other Ambulatory Visit: Payer: Self-pay

## 2020-08-27 ENCOUNTER — Ambulatory Visit: Payer: 59 | Admitting: Pulmonary Disease

## 2020-08-27 VITALS — BP 122/76 | HR 84 | Temp 98.0°F | Ht 69.0 in | Wt 175.0 lb

## 2020-08-27 DIAGNOSIS — J849 Interstitial pulmonary disease, unspecified: Secondary | ICD-10-CM | POA: Diagnosis not present

## 2020-08-27 NOTE — Progress Notes (Signed)
Synopsis: Referred in 04/2020 for chronic cough by Dr. Lamar Blinks, MD  Subjective:   PATIENT ID: William Odor GENDER: male DOB: 03-Jul-1950, MRN: [id number]  HPI  Chief Complaint  Patient presents with  . Follow-up    Still has cough   William Curry is a 70 year old male, never smoker with with history of GERD who returns to pulmonary clinic for follow up of interstitial lung disease.   His HRCT Chest on 05/14/20 showed subpleural reticulation, ground-glass, traction bronchiectasis/bronchiolectasis and probable honeycombing with upper/midlung involvement.   His PFTs 06/21/20 show moderate restrictive defect with normal DLCO.   Inflammatory markers and myositis panel are negative.  We reviewed his clinical history, HRCT chest and PFTs at multidisciplinary ILD conference in April 2022 with a recommendation to initiate anti-fibrotic therapy as his findings are concerning for pulmonary fibrosis.    He continues to experience cough with occasional clear/milky colored sputum. The nasal sprays have helped somehwat, he continues to use flonase. He has decreased his dose of pantoprazoled from 40mg  daily to 20mg  daily with no increase in his reflux symptoms.   OV 05/01/20: He reports having a cough since 2017 that has progressively worsened over the last 1 year. He does produce clear mucous at times. Has never cough up blood. He has been evaluated by GI and ENT for this cough in the past. He was started on pantoprazole over 1 year ago and he has not noticed any improvement in his cough, which he reports has worsened over the last year. He had laryngoscopy performed by ENT 10/10/19 which was unremarkable.   He denies any shortness of breath or wheezing with the cough. He denies overt heart burn or reflux symptoms. He denies seasonal allergies, sinus congestion or drainage.   He is a never smoker. He does have exposure to [employer] as he worked for [employer]. 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    He has a nephew with cystic fibrosis.   Past Medical History:  Diagnosis Date  . DDD (degenerative disc disease), lumbar   . GERD (gastroesophageal reflux disease)   . 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 History of chicken pox   . Hypertension 2002-2005  . Kidney stone 1994-2010   Passed stone in 1994, Lithotripsy in 2010, stones passed in 2018  . Macular degeneration   . Scoliosis   . 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 Tinea versicolor      Family History  Problem Relation Age of Onset  . Cancer Mother 6       Breast  . Diabetes Mother 52       on insulin  . Breast cancer Mother   . Diabetes Father 75       on insulin  . Bladder Cancer Father   . Colon cancer Maternal Grandfather   . Colon polyps Sister   . Esophageal cancer Neg Hx   . Rectal cancer Neg Hx   . Stomach cancer Neg Hx   . Prostate cancer Neg Hx      Social History   Socioeconomic History  . Marital status: Married    Spouse name: Not on file  . Number of children: Not on file  . Years of education: Not on file  . Highest education level:  Not on file  Occupational History  . Occupation: Insurance underwriter / Accountant  Tobacco Use  . Smoking status: Never Smoker  . Smokeless tobacco: Former Systems developer    Types: Chew  . Tobacco comment: admits do doing chewing tabacco. stopped in 2005  Vaping Use  . Vaping Use: Never used  Substance and Sexual Activity  . Alcohol use: Yes    Alcohol/week: 6.0 standard drinks    Types: 6 Cans of beer per week    Comment: 4 - 6 beers a week  . Drug use: No  . Sexual activity: Not on file  Other Topics Concern  . 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 Penicillins Other (See Comments)    As a child     Outpatient Medications Prior to Visit  Medication Sig Dispense Refill  . albuterol (VENTOLIN HFA) 108 (90 Base) MCG/ACT inhaler Inhale 2 puffs into the lungs every 6 (six) hours as needed for wheezing or shortness of breath. 8 g 6  . fluticasone (FLONASE) 50 MCG/ACT nasal spray SPRAY 1 SPRAY INTO BOTH NOSTRILS DAILY. 48 mL 2  . Multiple Vitamin (MULTIVITAMIN) tablet Take 1 tablet by mouth daily.    . Multiple Vitamins-Minerals (PRESERVISION AREDS 2+MULTI VIT PO) Take 1 tablet 2 (two) times daily by mouth.    Marland Kitchen OVER THE COUNTER MEDICATION CBD  Oil daily    . pantoprazole (PROTONIX) 20 MG tablet Take 1 tablet (20 mg total) by mouth daily. 30 tablet 6  . sildenafil (REVATIO) 20 MG tablet TAKE 3 TO 5 TABLETS BY MOUTH ONCE DAILY AS NEEDED 50 tablet 6  . terbinafine (LAMISIL) 1 % cream Apply topically 2 (two) times daily as needed.     . valACYclovir (VALTREX) 500 MG tablet Take 500 mg by mouth as needed.     . pantoprazole (PROTONIX) 40 MG tablet Take 1 tablet (40 mg total) by mouth daily. (Patient not taking: Reported on 06/21/2020) 90 tablet 3   No facility-administered medications prior to visit.    Review of Systems  Constitutional: Negative for chills, fever, malaise/fatigue and weight loss.  HENT: Negative for congestion, sinus pain and sore throat.   Eyes: Negative.   Respiratory: Positive for cough and sputum production. Negative for hemoptysis, shortness of breath and wheezing.   Cardiovascular: Negative for chest pain, palpitations, orthopnea, claudication, leg swelling and PND.  Gastrointestinal: Positive for heartburn. Negative for  abdominal pain, diarrhea, nausea and vomiting.  Genitourinary: Negative.   Musculoskeletal: Negative for joint pain and myalgias.  Skin: Negative for rash.  Neurological: Negative for dizziness, weakness and headaches.  Endo/Heme/Allergies: Does not bruise/bleed easily.  Psychiatric/Behavioral: The patient is not nervous/anxious.    Objective:   Vitals:   08/27/20 0859  BP: 122/76  Pulse: 84  Temp: 98 F (36.7 C)  TempSrc: Oral  SpO2: 98%  Weight: 175 lb (79.4 kg)  Height: 5\' 9"  (1.753 m)    Physical Exam Constitutional:  General: He is not in acute distress.    Appearance: Normal appearance. He is normal weight.  HENT:     Head: Normocephalic and atraumatic.     Nose: Nose normal.     Mouth/Throat:     Mouth: Mucous membranes are moist.     Pharynx: Oropharynx is clear.  Eyes:     General: No scleral icterus.    Conjunctiva/sclera: Conjunctivae normal.     Pupils: Pupils are equal, round, and reactive to light.  Cardiovascular:     Rate and Rhythm: Normal rate and regular rhythm.     Pulses: Normal pulses.     Heart sounds: Normal heart sounds. No murmur heard.   Pulmonary:     Effort: Pulmonary effort is normal.     Breath sounds: Rales (bilateral bases) present. No wheezing or rhonchi.  Abdominal:     General: Bowel sounds are normal.     Palpations: Abdomen is soft.  Musculoskeletal:     Right lower leg: No edema.     Left lower leg: No edema.  Lymphadenopathy:     Cervical: No cervical adenopathy.  Skin:    General: Skin is warm and dry.  Neurological:     General: No focal deficit present.     Mental Status: He is alert.  Psychiatric:        Mood and Affect: Mood normal.        Behavior: Behavior normal.        Thought Content: Thought content normal.        Judgment: Judgment normal.    CBC    Component Value Date/Time   WBC 5.6 03/01/2020 0817   RBC 4.68 03/01/2020 0817   HGB 15.2 03/01/2020 0817   HCT 45.1 03/01/2020 0817   PLT  196.0 03/01/2020 0817   MCV 96.2 03/01/2020 0817   MCHC 33.7 03/01/2020 0817   RDW 12.7 03/01/2020 0817   BMP Latest Ref Rng & Units 03/01/2020 08/10/2019 12/23/2018  Glucose 70 - 99 mg/dL 92 95 95  BUN 6 - 23 mg/dL 18 19 20   Creatinine 0.40 - 1.50 mg/dL 0.94 0.90 1.01  Sodium 135 - 145 mEq/L 143 138 140  Potassium 3.5 - 5.1 mEq/L 4.4 4.3 4.3  Chloride 96 - 112 mEq/L 104 102 103  CO2 19 - 32 mEq/L 35(H) 31 31  Calcium 8.4 - 10.5 mg/dL 9.0 9.0 9.2   Chest imaging: HRCT Chest 05/14/20 1. Pulmonary parenchymal pattern of fibrosis may be due to nonspecific interstitial pneumonitis. Usual interstitial pneumonitis is not excluded. Findings are indeterminate for UIP per consensus guidelines: Diagnosis of Idiopathic Pulmonary Fibrosis: An Official ATS/ERS/JRS/ALAT Clinical Practice Guideline. [place], Iss 5, [phone], Dec 20 2016. 2. Small to borderline enlarged mediastinal lymph nodes can be seen in the setting of interstitial lung disease. 3. Right renal stone.  Chest Radiograph 05/04/2018 Cardiac shadow is enlarged. Pacing device is noted. The lungs are well aerated bilaterally with some minimal interstitial changes. No focal confluent infiltrate or effusion is seen. No bony abnormality is noted.  PFT: PFT Results Latest Ref Rng & Units 06/21/2020  FVC-Pre L 2.96  FVC-Predicted Pre % 68  FVC-Post L 2.87  FVC-Predicted Post % 66  Pre FEV1/FVC % % 93  Post FEV1/FCV % % 95  FEV1-Pre L 2.75  FEV1-Predicted Pre % 86  FEV1-Post L 2.74  DLCO uncorrected ml/min/mmHg 22.53  DLCO UNC% % 89  DLCO corrected ml/min/mmHg 22.53  DLCO COR %  Predicted % 89  DLVA Predicted % 144  TLC L 4.02  TLC % Predicted % 58  RV % Predicted % 50  PFTs 2022: mild restrictive defect present.     Assessment & Plan:   Interstitial pulmonary disease ([place]) - Plan: Pulmonary function test  Discussion: William Curry is a 70 year old male, never smoker with with history of GERD who  returns to pulmonary clinic for follow up of interstitial lung disease.   The etiology of his cough appears related to post-nasal drainage as he has notable improvement since using fluticasone nasal spray and underlying interstitial lung disease concerning for pulmonary fibrosis along with history of GERD.   Based on the ILD conference in April, we will plan to start him on antifibrotic therapy. We will schedule him for a consult with our pharmacy team to discuss the treatment options with ofev and pirfenidone. I have provided him with information on each of these drugs today.   We will plan to check PFTs next month to monitor for any deterioration in lung function. He has travel planned for June, so we will discuss when to start antifibrotic therapy after his PFTs.  Follow up in 2 months.  Freda [facility], MD [facility] Office: [phone]   Current Outpatient Medications:  .  albuterol (VENTOLIN HFA) 108 (90 Base) MCG/ACT inhaler, Inhale 2 puffs into the lungs every 6 (six) hours as needed for wheezing or shortness of breath., Disp: 8 g, Rfl: 6 .  fluticasone (FLONASE) 50 MCG/ACT nasal spray, SPRAY 1 SPRAY INTO BOTH NOSTRILS DAILY., Disp: 48 mL, Rfl: 2 .  Multiple Vitamin (MULTIVITAMIN) tablet, Take 1 tablet by mouth daily., Disp: , Rfl:  .  Multiple Vitamins-Minerals (PRESERVISION AREDS 2+MULTI VIT PO), Take 1 tablet 2 (two) times daily by mouth., Disp: , Rfl:  .  OVER THE COUNTER MEDICATION, CBD  Oil daily, Disp: , Rfl:  .  pantoprazole (PROTONIX) 20 MG tablet, Take 1 tablet (20 mg total) by mouth daily., Disp: 30 tablet, Rfl: 6 .  sildenafil (REVATIO) 20 MG tablet, TAKE 3 TO 5 TABLETS BY MOUTH ONCE DAILY AS NEEDED, Disp: 50 tablet, Rfl: 6 .  terbinafine (LAMISIL) 1 % cream, Apply topically 2 (two) times daily as needed. , Disp: , Rfl:  .  valACYclovir (VALTREX) 500 MG tablet, Take 500 mg by mouth as needed. , Disp: , Rfl:

## 2020-08-27 NOTE — Patient Instructions (Addendum)
We will schedule you for a visit with our pharmacist to go over anti-fibrotic therapies.   We will schedule you for pulmonary function tests the first week of June.   Elevated the head of your bed 4-6 inches to prevent nocturnal reflux.  Food Choices for Gastroesophageal Reflux Disease, Adult When you have gastroesophageal reflux disease (GERD), the foods you eat and your eating habits are very important. Choosing the right foods can help ease your discomfort. Think about working with a food expert (dietitian) to help you make good choices. What are tips for following this plan? Reading food labels  Look for foods that are low in saturated fat. Foods that may help with your symptoms include: ? Foods that have less than 5% of daily value (DV) of fat. ? Foods that have 0 grams of trans fat. Cooking  Do not fry your food.  Cook your food by baking, steaming, grilling, or broiling. These are all methods that do not need a lot of fat for cooking.  To add flavor, try to use herbs that are low in spice and acidity. Meal planning  Choose healthy foods that are low in fat, such as: ? Fruits and vegetables. ? Whole grains. ? Low-fat dairy products. ? Lean meats, fish, and poultry.  Eat small meals often instead of eating 3 large meals each day. Eat your meals slowly in a place where you are relaxed. Avoid bending over or lying down until 2-3 hours after eating.  Limit high-fat foods such as fatty meats or fried foods.  Limit your intake of fatty foods, such as oils, butter, and shortening.  Avoid the following as told by your doctor: ? Foods that cause symptoms. These may be different for different people. Keep a food diary to keep track of foods that cause symptoms. ? Alcohol. ? Drinking a lot of liquid with meals. ? Eating meals during the 2-3 hours before bed.   Lifestyle  Stay at a healthy weight. Ask your doctor what weight is healthy for you. If you need to lose weight, work  with your doctor to do so safely.  Exercise for at least 30 minutes on 5 or more days each week, or as told by your doctor.  Wear loose-fitting clothes.  Do not smoke or use any products that contain nicotine or tobacco. If you need help quitting, ask your doctor.  Sleep with the head of your bed higher than your feet. Use a wedge under the mattress or blocks under the bed frame to raise the head of the bed.  Chew sugar-free gum after meals. What foods should eat? Eat a healthy, well-balanced diet of fruits, vegetables, whole grains, low-fat dairy products, lean meats, fish, and poultry. Each person is different. Foods that may cause symptoms in one person may not cause any symptoms in another person. Work with your doctor to find foods that are safe for you. The items listed above may not be a complete list of what you can eat and drink. Contact a food expert for more options.   What foods should I avoid? Limiting some of these foods may help in managing the symptoms of GERD. Everyone is different. Talk with a food expert or your doctor to help you find the exact foods to avoid, if any. Fruits Any fruits prepared with added fat. Any fruits that cause symptoms. For some people, this may include citrus fruits, such as oranges, grapefruit, pineapple, and lemons. Vegetables Deep-fried vegetables. [place] fries. Any vegetables prepared  with added fat. Any vegetables that cause symptoms. For some people, this may include tomatoes and tomato products, chili peppers, onions and garlic, and horseradish. Grains Pastries or quick breads with added fat. Meats and other proteins High-fat meats, such as fatty beef or pork, hot dogs, ribs, ham, sausage, salami, and bacon. Fried meat or protein, including fried fish and fried chicken. Nuts and nut butters, in large amounts. Dairy Whole milk and chocolate milk. Sour cream. Cream. Ice cream. Cream cheese. Milkshakes. Fats and oils Butter. Margarine.  Shortening. Ghee. Beverages Coffee and tea, with or without caffeine. Carbonated beverages. Sodas. Energy drinks. Fruit juice made with acidic fruits, such as orange or grapefruit. Tomato juice. Alcoholic drinks. Sweets and desserts Chocolate and cocoa. Donuts. Seasonings and condiments Pepper. Peppermint and spearmint. Added salt. Any condiments, herbs, or seasonings that cause symptoms. For some people, this may include curry, hot sauce, or vinegar-based salad dressings. The items listed above may not be a complete list of what you should not eat and drink. Contact a food expert for more options. Questions to ask your doctor Diet and lifestyle changes are often the first steps that are taken to manage symptoms of GERD. If diet and lifestyle changes do not help, talk with your doctor about taking medicines. Where to find more information  International Foundation for Gastrointestinal Disorders: aboutgerd.org Summary  When you have GERD, food and lifestyle choices are very important in easing your symptoms.  Eat small meals often instead of 3 large meals a day. Eat your meals slowly and in a place where you are relaxed.  Avoid bending over or lying down until 2-3 hours after eating.  Limit high-fat foods such as fatty meats or fried foods. This information is not intended to replace advice given to you by your health care provider. Make sure you discuss any questions you have with your health care provider. Document Revised: 10/17/2019 Document Reviewed: 10/17/2019 Elsevier Patient Education  Home.

## 2020-08-29 ENCOUNTER — Encounter: Payer: Self-pay | Admitting: Pulmonary Disease

## 2020-09-06 ENCOUNTER — Other Ambulatory Visit: Payer: Self-pay | Admitting: Family Medicine

## 2020-09-07 ENCOUNTER — Other Ambulatory Visit (HOSPITAL_COMMUNITY)
Admission: RE | Admit: 2020-09-07 | Discharge: 2020-09-07 | Disposition: A | Discharge status: Home / Self Care | Payer: 59 | Source: Ambulatory Visit | Attending: Pulmonary Disease | Admitting: Pulmonary Disease

## 2020-09-07 DIAGNOSIS — Z20822 Contact with and (suspected) exposure to covid-19: Secondary | ICD-10-CM | POA: Insufficient documentation

## 2020-09-07 DIAGNOSIS — Z01812 Encounter for preprocedural laboratory examination: Secondary | ICD-10-CM | POA: Insufficient documentation

## 2020-09-07 LAB — SARS CORONAVIRUS 2 (TAT 6-24 HRS): SARS Coronavirus 2: NEGATIVE

## 2020-09-10 ENCOUNTER — Ambulatory Visit (INDEPENDENT_AMBULATORY_CARE_PROVIDER_SITE_OTHER): Payer: 59 | Admitting: Pulmonary Disease

## 2020-09-10 ENCOUNTER — Other Ambulatory Visit: Payer: Self-pay

## 2020-09-10 ENCOUNTER — Ambulatory Visit: Payer: 59 | Admitting: Pharmacist

## 2020-09-10 DIAGNOSIS — Z5181 Encounter for therapeutic drug level monitoring: Secondary | ICD-10-CM

## 2020-09-10 DIAGNOSIS — J849 Interstitial pulmonary disease, unspecified: Secondary | ICD-10-CM | POA: Diagnosis not present

## 2020-09-10 DIAGNOSIS — Z79899 Other long term (current) drug therapy: Secondary | ICD-10-CM

## 2020-09-10 LAB — PULMONARY FUNCTION TEST
DL/VA % pred: 136 %
DL/VA: 5.58 ml/min/mmHg/L
DLCO cor % pred: 105 %
DLCO cor: 26.58 ml/min/mmHg
DLCO unc % pred: 105 %
DLCO unc: 26.58 ml/min/mmHg
FEF 25-75 Post: 5.58 L/sec
FEF 25-75 Pre: 6.36 L/sec
FEF2575-%Change-Post: -12 %
FEF2575-%Pred-Post: 229 %
FEF2575-%Pred-Pre: 261 %
FEV1-%Change-Post: 0 %
FEV1-%Pred-Post: 88 %
FEV1-%Pred-Pre: 89 %
FEV1-Post: 2.81 L
FEV1-Pre: 2.84 L
FEV1FVC-%Change-Post: 0 %
FEV1FVC-%Pred-Pre: 127 %
FEV6-%Change-Post: 0 %
FEV6-%Pred-Post: 73 %
FEV6-%Pred-Pre: 74 %
FEV6-Post: 2.99 L
FEV6-Pre: 3.01 L
FEV6FVC-%Pred-Post: 105 %
FEV6FVC-%Pred-Pre: 105 %
FVC-%Change-Post: 0 %
FVC-%Pred-Post: 69 %
FVC-%Pred-Pre: 70 %
FVC-Post: 2.99 L
FVC-Pre: 3.01 L
Post FEV1/FVC ratio: 94 %
Post FEV6/FVC ratio: 100 %
Pre FEV1/FVC ratio: 94 %
Pre FEV6/FVC Ratio: 100 %
RV % pred: 67 %
RV: 1.59 L
TLC % pred: 68 %
TLC: 4.64 L

## 2020-09-10 NOTE — Progress Notes (Signed)
Full PFT performed today. °

## 2020-09-10 NOTE — Patient Instructions (Signed)
Full PFT performed today. °

## 2020-09-10 NOTE — Progress Notes (Signed)
Subjective:  Patient presents today to [facility] to see pharmacy team for Esbriet new start counseling.   Patient was last seen and referred by Dr. Erin Fulling on 08/27/20. He has history of GERD for which he takes pantoprazole. 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  Followed by Dr. Caryl Comes for pacemaker monitoring (installed in January 2013).   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 He is unsure about starting a medication with significant side effects though he understands there are not many options pharmacologically. He has a wedding planned in early June and would want to hold off on starting a medication until after.  He has history of benign skin cancer localized to his right knee managed with local surgical excision.  History of CAD: LBBB History of elevated LFTs: no recent LFTs History of diarrhea, nausea, vomiting: none at baseline  Objective: Allergies  Allergen Reactions  . Penicillins Other (See Comments)    As a child   Outpatient Encounter Medications as of 09/10/2020  Medication Sig  . sildenafil (REVATIO) 20 MG tablet TAKE 3 TO 5 TABLETS BY MOUTH ONCE DAILY AS NEEDED  . albuterol (VENTOLIN HFA) 108 (90 Base) MCG/ACT inhaler Inhale 2 puffs into the lungs every 6 (six) hours as needed for wheezing or shortness of breath.  . fluticasone (FLONASE) 50 MCG/ACT nasal spray SPRAY 1 SPRAY INTO BOTH NOSTRILS DAILY.  . Multiple Vitamin (MULTIVITAMIN) tablet Take 1 tablet by mouth daily.  . Multiple Vitamins-Minerals (PRESERVISION AREDS 2+MULTI VIT PO) Take 1 tablet 2 (two) times daily by mouth.  Marland Kitchen OVER THE COUNTER MEDICATION CBD  Oil daily  . pantoprazole (PROTONIX) 20 MG tablet Take 1 tablet (20 mg total) by mouth daily.  Marland Kitchen terbinafine (LAMISIL) 1 % cream Apply topically 2 (two) times daily  as needed.   . valACYclovir (VALTREX) 500 MG tablet Take 500 mg by mouth as needed.    No facility-administered encounter medications on file as of 09/10/2020.     Immunization History  Administered Date(s) Administered  . Fluad Quad(high Dose 65+) 12/23/2018, 02/23/2020  . Influenza, Seasonal, Injecte, Preservative Fre 04/01/2012  . Influenza,inj,Quad PF,6+ Mos 02/28/2013, 01/25/2014, 03/14/2015, 02/27/2017  . Influenza-Unspecified 03/06/2016, 01/28/2018  . Moderna SARS-COV2 Booster Vaccination 03/09/2020  . Moderna Sars-Covid-2 Vaccination 05/26/2019, 06/28/2019  . Pneumococcal Conjugate-13 12/23/2018  . Pneumococcal Polysaccharide-23 03/01/2020  . Tdap 05/16/2014  . Zoster 02/01/2015    PFT's TLC  Date Value Ref Range Status  06/21/2020 4.02 L Final    CMP     Component Value Date/Time   NA 143 03/01/2020 0817   K 4.4 03/01/2020 0817   CL 104 03/01/2020 0817   CO2 35 (H) 03/01/2020 0817   GLUCOSE 92 03/01/2020 0817   BUN 18 03/01/2020 0817   CREATININE 0.94 03/01/2020 0817   CALCIUM 9.0 03/01/2020 0817   PROT 6.9 03/01/2020 0817   ALBUMIN 4.2 03/01/2020 0817   AST 17 03/01/2020 0817   ALT 14 03/01/2020 0817   ALKPHOS 52 03/01/2020 0817   BILITOT 1.1 03/01/2020 0817   BILITOT 1.1 03/01/2020 0817    CBC    Component Value Date/Time   WBC 5.6 03/01/2020 0817   RBC 4.68 03/01/2020 0817   HGB 15.2 03/01/2020 0817   HCT 45.1 03/01/2020 0817   PLT 196.0 03/01/2020 0817   MCV 96.2 03/01/2020 0817  MCHC 33.7 03/01/2020 0817   RDW 12.7 03/01/2020 0817     LFT's Hepatic Function Latest Ref Rng & Units 03/01/2020 03/01/2020 08/10/2019  Total Protein 6.0 - 8.3 g/dL - 6.9 7.1  Albumin 3.5 - 5.2 g/dL - 4.2 4.4  AST 0 - 37 U/L - 17 17  ALT 0 - 53 U/L - 14 17  Alk Phosphatase 39 - 117 U/L - 52 51  Total Bilirubin 0.2 - 1.2 mg/dL 1.1 1.1 1.7(H)  Bilirubin, Direct 0.0 - 0.2 mg/dL 0.3(H) - -    HRCT (05/14/20) - 1. Pulmonary parenchymal pattern of fibrosis may be due  to nonspecific interstitial pneumonitis. Usual interstitial pneumonitis is not excluded. Findings are indeterminate for UIP per consensus guidelines: Diagnosis of Idiopathic Pulmonary Fibrosis: An Official ATS/ERS/JRS/ALAT Clinical Practice Guideline. [place], Iss 5, [phone], Dec 20 2016. 2. Small to borderline enlarged mediastinal lymph nodes can be seen in the setting of interstitial lung disease. 3. Right renal stone.  ASSESSMENT  1. Esbriet/Ofev Medication Management - Esbriet: Patient counseled on purpose, proper use, and potential adverse effects including nausea, vomiting, abdominal pain, GERD, weight loss, arthralgia, dizziness, and suns sensitivity/rash.  Stressed the importance of routine lab monitoring. Will monitor LFT's every month for the first 6 months of treatment then every 3 months. Will monitor CBC every 3 months.  Starting dose would be be Esbriet 267 mg 1 tablet three times daily for 7 days, then 2 tablets three times daily for 7 days, then 3 tablets three times daily.  Maintenance dose will be 801 mg 1 tablet three times daily if tolerated.  Stressed the importance of taking with meals to minimize stomach upset.   - Ofev: Patient counseled on purpose, proper use, and potential adverse effects including diarrhea, nausea, vomiting, abdominal pain, decreased appetite, weight loss, and increased blood pressure. Discussed risk of  Stressed the importance of routine lab monitoring. Will monitor LFT's every month for the first 6 months of treatment then every 3 months. Will monitor CBC every 3 months.  Ofev dose will be 150 mg capsule every 12 hours with food. Stressed importance of taking with food to minimize stomach upset.   2. Medication Reconciliation  A drug regimen assessment was performed, including review of allergies, interactions, disease-state management, dosing and immunization history. Medications were reviewed with the patient, including name,  instructions, indication, goals of therapy, potential side effects, importance of adherence, and safe use.  Drug interaction(s):  None noted  3. Immunizations  He is UTD on vaccinations. He is eligible to receive 2-dose Shingrix vaccine which is inactivates. He received live zoster vaccine in 2016.  PLAN - Given patient's CV history of LBBB, Esbriet would be preferred over Ofev in terms of antifibrotics.  Patient agreeable to starting benefits investigation for Esbriet. Start Esbriet BIV - dosing for first month with 272m tabs will be: Take 1 tab (2632m by mouth three times daily for 7 days, then take 2 tabs (53450mby mouth three times daily for 7 days, then take 3 tabs (801m58my mouth three times daily thereafter. - Access: patient has commercial coverage under his spouse and is not planning on enrolling into Part D coverage for medications anytime soon. We will complete [employer] patient assistance paperwork since Esbriet may be denied through insurance for non-IPF indication.   - Updated CBC and CMP at f/u visit with Dr. DewaErin Fullingwe have baseline labs - F/u with Dr. DewaErin Fullingeady scheduled for 10/18/20 -  will discuss antifibrotic decision at that visit but he is agreeable to having benefits and approval in place prior to this visit.  All questions encouraged and answered.  Instructed patient to call with any further questions or concerns.  Thank you for allowing pharmacy to participate in this patient's care.  Knox Saliva, PharmD, MPH Clinical Pharmacist (Rheumatology and Pulmonology)

## 2020-09-11 ENCOUNTER — Telehealth: Payer: Self-pay | Admitting: Pharmacist

## 2020-09-11 NOTE — Telephone Encounter (Signed)
Please start Esbriet BIV.  Dose: 267mg  tabs 1 tab three times daily for 7 days, then take 2 tabs by mouth three times daily for 7 days, then take 3 tabs by mouth three times daily thereafter.  (#207 for first 30 days)  Dx: J84.9 (ILD)  He has commercial plan under wife's employer, so would qualify for copay card  Knox Saliva, PharmD, MPH Clinical Pharmacist (Rheumatology and Pulmonology)

## 2020-09-12 NOTE — Telephone Encounter (Signed)
Submitted a Prior Authorization request to [facility] for ESBRIET via CoverMyMeds. Will update once we receive a response.   KeyBurnard Bunting - PA Case ID: [id number]

## 2020-09-13 NOTE — Telephone Encounter (Signed)
Received a fax regarding Prior Authorization from [employer] for [place]. Authorization has been DENIED because "Current plan approved criteria does not allow coverage of Esbriet unless the patient has idiopathic pulmonary fibrosis."  Phone# [phone] Appeal Fax# [phone]

## 2020-09-14 NOTE — Telephone Encounter (Signed)
Will have to appeal Esbriet denial through insurance. Patient updated with this information and mailed patient assistance application. Will place provider portion in Dr. August Albino mailbox to be signed  Knox Saliva, PharmD, MPH Clinical Pharmacist (Rheumatology and Pulmonology)

## 2020-09-17 DIAGNOSIS — J849 Interstitial pulmonary disease, unspecified: Secondary | ICD-10-CM | POA: Insufficient documentation

## 2020-09-17 NOTE — Patient Instructions (Signed)
We will start Esbriet benefits investigation so we have coverage questions answered when you have your next visit with Dr. Erin Fulling in June  Pirfenidone capsules or tablets What is this medicine? PIRFENIDONE (peer [place] nih done) is used to treat idiopathic pulmonary fibrosis. This medicine may be used for other purposes; ask your health care provider or pharmacist if you have questions. COMMON BRAND NAME(S): ESBRIET What should I tell my health care provider before I take this medicine? They need to know if you have any of these conditions:  kidney disease  liver disease  smoke tobacco  an unusual or allergic reaction to pirfenidone, or other medicines, foods, dyes, or preservatives  pregnant or trying to get pregnant  breast-feeding How should I use this medicine? Take this medicine by mouth with a glass of water. Follow the directions on the prescription label. Take this medicine with food. Take your medicine at regular intervals. Do not take it more often than directed. Do not stop taking except on your doctor's advice. Talk to your pediatrician regarding the use of the medicine in children. Special care may be needed. Overdosage: If you think you have taken too much of this medicine contact a poison control center or emergency room at once. NOTE: This medicine is only for you. Do not share this medicine with others. What if I miss a dose? If you miss a dose, take it as soon as you can. If it is almost time for your next dose, take only that dose. Do not take double or extra doses. If you miss 14 or more days of taking the medicine, call your healthcare provider before starting again. What may interact with this medicine? This medicine may interact with the following medications:  certain antiinfectives like ciprofloxacin, thiabendazole  certain medicines for asthma like zafirlukast, zileuton, montelukast  certain medicines for depression, anxiety, or psychotic  disturbances  certain medicines for irregular heart beat like amiodarone, mexiletine  certain medicines for seizures like carbamazepine, phenytoin, fosphenytoin  disulfiram  stomach acid blockers like cimetidine  vemurafenib This list may not describe all possible interactions. Give your health care provider a list of all the medicines, herbs, non-prescription drugs, or dietary supplements you use. Also tell them if you smoke, drink alcohol, or use illegal drugs. Some items may interact with your medicine. What should I watch for while using this medicine? Tell your doctor or healthcare professional if your symptoms do not start to get better or if they get worse. If you smoke, tell your doctor if you notice this medicine is not working well for you. Talk to your doctor if you are a smoker or if you decide to stop smoking. This medicine can make you more sensitive to the sun. Keep out of the sun. If you cannot avoid being in the sun, wear protective clothing and use sunscreen. Do not use sun lamps or tanning beds/booths. What side effects may I notice from receiving this medicine? Side effects that you should report to your doctor or health care professional as soon as possible:  allergic reactions like skin rash, itching or hives, swelling of the face, lips, or tongue  signs and symptoms of liver injury like dark yellow or brown urine; general ill feeling or flu-like symptoms; light colored stools; loss of appetite; nausea; right upper belly pain; unusually weak or tired; yellowing of the eyes or skin  sunburn Side effects that usually do not require medical attention (report to your doctor or health care professional if  they continue or are bothersome):  changes in taste  diarrhea  dizziness  headache  joint pain  nausea; vomiting  stomach pain  trouble sleeping  weak or tired  weight loss This list may not describe all possible side effects. Call your doctor for  medical advice about side effects. You may report side effects to FDA at 1-800-FDA-1088. Where should I keep my medicine? Keep out of the reach of children. Store at room temperature between 15 and 30 degrees C (59 and 86 degrees F). Throw away any unused medicine after the expiration date. NOTE: This sheet is a summary. It may not cover all possible information. If you have questions about this medicine, talk to your doctor, pharmacist, or health care provider.  2021 Elsevier/Gold Standard (2015-08-08 13:06:09)

## 2020-09-18 ENCOUNTER — Ambulatory Visit (INDEPENDENT_AMBULATORY_CARE_PROVIDER_SITE_OTHER): Payer: 59

## 2020-09-18 DIAGNOSIS — I442 Atrioventricular block, complete: Secondary | ICD-10-CM

## 2020-09-18 LAB — CUP PACEART REMOTE DEVICE CHECK
Battery Remaining Longevity: 1 mo
Battery Remaining Percentage: 1 %
Battery Voltage: 2.63 V
Brady Statistic AP VP Percent: 13 %
Brady Statistic AP VS Percent: 5.9 %
Brady Statistic AS VP Percent: 69 %
Brady Statistic AS VS Percent: 12 %
Brady Statistic RA Percent Paced: 19 %
Brady Statistic RV Percent Paced: 82 %
Date Time Interrogation Session: 20220531074617
Implantable Lead Implant Date: 20130115
Implantable Lead Implant Date: 20130115
Implantable Lead Location: 753859
Implantable Lead Location: 753860
Implantable Pulse Generator Implant Date: 20130115
Lead Channel Impedance Value: 360 Ohm
Lead Channel Impedance Value: 400 Ohm
Lead Channel Pacing Threshold Amplitude: 0.375 V
Lead Channel Pacing Threshold Amplitude: 1.875 V
Lead Channel Pacing Threshold Pulse Width: 0.4 ms
Lead Channel Pacing Threshold Pulse Width: 0.4 ms
Lead Channel Sensing Intrinsic Amplitude: 2 mV
Lead Channel Sensing Intrinsic Amplitude: 3.9 mV
Lead Channel Setting Pacing Amplitude: 1.375
Lead Channel Setting Pacing Amplitude: 2.125
Lead Channel Setting Pacing Pulse Width: 0.4 ms
Lead Channel Setting Sensing Sensitivity: 0.5 mV
Pulse Gen Model: 2210
Pulse Gen Serial Number: 7304172

## 2020-09-20 ENCOUNTER — Other Ambulatory Visit (HOSPITAL_COMMUNITY): Payer: Self-pay

## 2020-09-20 NOTE — Telephone Encounter (Signed)
Faxed urgent appeal to [facility] for [place] along with office notes and Phase 2b RELIEF trial  Fax: [phone] Phone: [phone] Case: [id number]  Knox Saliva, PharmD, MPH Clinical Pharmacist (Rheumatology and Pulmonology)

## 2020-09-21 NOTE — Telephone Encounter (Signed)
Received [employer] patient assistance paperwork from patient today. Awaiting provider provider from Dr. Erin Fulling.  Placed this form along with Esbriet appeal that was submitted in Appeals folder in [place] office. Once we get Dr. August Albino portion, we will be able to submit patient assistance packet while awaiting appeal  Will continue to f/u  Knox Saliva, PharmD, MPH Clinical Pharmacist (Rheumatology and Pulmonology)

## 2020-09-24 ENCOUNTER — Other Ambulatory Visit (HOSPITAL_COMMUNITY): Payer: Self-pay

## 2020-09-24 NOTE — Telephone Encounter (Signed)
Received notification from [facility] regarding a prior authorization for ESBRIET. Authorization has been APPROVED from 08/22/2020 to 09/22/2021.   Authorization # [id number] SM Phone # [phone]  Test claim reveals that insurance covers $5,196.06, leaving pt with a copay of $2,451.35. Eligible for Manufacturer Copay Card.

## 2020-09-25 ENCOUNTER — Other Ambulatory Visit (HOSPITAL_COMMUNITY): Payer: Self-pay

## 2020-09-25 NOTE — Telephone Encounter (Signed)
Applied for copay card on behalf of pt. Card and welcome letter should be mailed to pt in the next few days. Test claim revealed that patient did indeed have a $5 copay using both insurance and copay card.  BIN: [id number] ID: [id number] Active Date: 04/21/2020

## 2020-10-03 NOTE — Telephone Encounter (Signed)
Received signed provider access solutions form. Will send to the scan center since patient was approved for copay card.

## 2020-10-11 NOTE — Progress Notes (Signed)
Remote pacemaker transmission.   

## 2020-10-18 ENCOUNTER — Other Ambulatory Visit: Payer: Self-pay

## 2020-10-18 ENCOUNTER — Ambulatory Visit: Payer: 59 | Admitting: Pulmonary Disease

## 2020-10-18 ENCOUNTER — Encounter: Payer: Self-pay | Admitting: Pulmonary Disease

## 2020-10-18 VITALS — BP 120/78 | HR 81 | Ht 69.0 in | Wt 168.0 lb

## 2020-10-18 DIAGNOSIS — J84112 Idiopathic pulmonary fibrosis: Secondary | ICD-10-CM | POA: Diagnosis not present

## 2020-10-18 NOTE — Patient Instructions (Signed)
Start DuoNeb nebulizer treatments in the morning time and evening time followed by flutter valve therapy for bronchial hygiene.  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  The bronchiectasis can lead to increased cough with mucus production.  We will have you follow-up in 3 months with pulmonary function test.

## 2020-10-18 NOTE — Progress Notes (Signed)
Synopsis: Referred in 04/2020 for chronic cough by Dr. Lamar Blinks, MD  Subjective:   PATIENT ID: William Curry GENDER: male DOB: Jul 23, 1950, MRN: [id number]  HPI  Chief Complaint  Patient presents with   Interstitial Lung Disease    PFT done in May, unable to take albuterol as it irritates throat    William Curry is a 70 year old male, never smoker with with history of GERD who returns to pulmonary clinic for follow up of interstitial lung disease.   Repeat PFTs on 09/10/20 showed stability in his lung function with mild restrictive defect. DLCO remains normal.   He has been approved for esbriet and has meet with our pharmacy team about the benefits and risks of the medication as outlined in there meeting on 09/10/20.  He wishes to currently hold off on this therapy at this time until later this fall as he wants to avoid any of the side effects this summer.  He has had an increase in cough symptoms with sputum production since last visit.  He attributes this to his recent travels to the [facility] and changes in the climate from cold to hot weather.  He denies any shortness of breath.  His wife has accompanied him on today's visit.  He brought a copy of his mother's death certificate which had idiopathic pulmonary fibrosis listed as the cause of death.  He reports she was a very heavy smoker.  OV 08/27/2020: His HRCT Chest on 05/14/20 showed subpleural reticulation, ground-glass, traction bronchiectasis/bronchiolectasis and probable honeycombing with upper/midlung involvement.   His PFTs 06/21/20 show moderate restrictive defect with normal DLCO.   Inflammatory markers and myositis panel are negative.  We reviewed his clinical history, HRCT chest and PFTs at multidisciplinary ILD conference in April 2022 with a recommendation to initiate anti-fibrotic therapy as his findings are concerning for pulmonary fibrosis.    He continues to experience cough with occasional clear/milky  colored sputum. The nasal sprays have helped somehwat, he continues to use flonase. He has decreased his dose of pantoprazoled from 40mg  daily to 20mg  daily with no increase in his reflux symptoms.   OV 05/01/20: He reports having a cough since 2017 that has progressively worsened over the last 1 year. He does produce clear mucous at times. Has never cough up blood. He has been evaluated by GI and ENT for this cough in the past. He was started on pantoprazole over 1 year ago and he has not noticed any improvement in his cough, which he reports has worsened over the last year. He had laryngoscopy performed by ENT 10/10/19 which was unremarkable.   He denies any shortness of breath or wheezing with the cough. He denies overt heart burn or reflux symptoms. He denies seasonal allergies, sinus congestion or drainage.   He is a never smoker. He does have exposure to [employer] as he worked for [employer]. 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    He has a nephew with cystic fibrosis.  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 Right Knee   Tinea versicolor      Family History  Problem Relation Age of Onset   Cancer Mother 26       Breast   Diabetes Mother 25       on insulin   Breast cancer Mother    Diabetes Father 40       on insulin   Bladder Cancer Father    Colon cancer Maternal Grandfather    Colon polyps Sister    Esophageal cancer Neg Hx    Rectal cancer Neg Hx    Stomach cancer Neg Hx    Prostate  cancer Neg Hx      Social History   Socioeconomic History   Marital status: Married    Spouse name: Not on file   Number of children: Not on file   Years of education: Not on file   Highest education level: Not on file  Occupational History   Occupation: Insurance underwriter / Accountant  Tobacco Use   Smoking status: Never   Smokeless tobacco: Former    Types: Chew   Tobacco comments:    admits do doing chewing tabacco. 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   To [place] 2013   Remarried 2007   [employer] [place] '71-'75   Social Determinants of Health   Financial Resource Strain: Not on file  Food Insecurity: Not on file  Transportation Needs: Not on file  Physical Activity: Not on file  Stress: Not on file  Social Connections: Not on file  Intimate Partner Violence: Not on file     Allergies  Allergen Reactions   Penicillins Other (See Comments)    As a child     Outpatient Medications Prior to Visit  Medication Sig Dispense Refill   fluticasone (FLONASE) 50 MCG/ACT nasal spray SPRAY 1 SPRAY INTO BOTH NOSTRILS DAILY. 48 mL 2   Multiple Vitamin (MULTIVITAMIN) tablet Take 1 tablet by mouth daily.     Multiple Vitamins-Minerals (PRESERVISION AREDS 2+MULTI VIT PO) Take 1 tablet 2 (two) times daily by mouth.     OVER THE COUNTER MEDICATION CBD  Oil daily     pantoprazole (PROTONIX) 20 MG tablet Take 1 tablet (20 mg total) by mouth daily. 30 tablet 6   sildenafil (REVATIO) 20 MG tablet TAKE 3 TO 5 TABLETS BY MOUTH ONCE DAILY AS NEEDED 50 tablet 2   terbinafine (LAMISIL) 1 % cream Apply topically 2 (two) times daily as needed.       valACYclovir (VALTREX) 500 MG tablet Take 500 mg by mouth as needed.      albuterol (VENTOLIN HFA) 108 (90 Base) MCG/ACT inhaler Inhale 2 puffs into the lungs every 6 (six) hours as needed for wheezing or shortness of breath. (Patient not taking: Reported on 10/18/2020) 8 g 6   No facility-administered medications prior to visit.    Review of Systems  Constitutional:  Negative for  chills, fever, malaise/fatigue and weight loss.  HENT:  Negative for congestion, sinus pain and sore throat.   Eyes: Negative.   Respiratory:  Positive for cough and sputum production. Negative for hemoptysis, shortness of breath and wheezing.   Cardiovascular:  Negative for chest pain, palpitations, orthopnea, claudication and leg swelling.  Gastrointestinal:  Negative for abdominal pain, heartburn, nausea and vomiting.  Genitourinary: Negative.   Musculoskeletal:  Negative for joint pain and myalgias.  Skin:  Negative for rash.  Neurological:  Negative for weakness.  Endo/Heme/Allergies: Negative.   Psychiatric/Behavioral: Negative.  The patient is not nervous/anxious.   Objective:   Vitals:   10/18/20 0854  BP: 120/78  Pulse: 81  SpO2: 99%  Weight: 168 lb (76.2 kg)  Height: 5\' 9"  (1.753 m)     Physical Exam Constitutional:      General: He is not in acute distress. HENT:     Head: Normocephalic and atraumatic.  Eyes:     Extraocular Movements: Extraocular movements intact.     Conjunctiva/sclera: Conjunctivae normal.     Pupils: Pupils are equal, round, and reactive to light.  Cardiovascular:     Rate and Rhythm: Normal rate and regular rhythm.     Pulses: Normal pulses.     Heart sounds: Normal heart sounds. No murmur heard. Pulmonary:     Effort: Pulmonary effort is normal.     Breath sounds: Rales (bibasilar) present. No wheezing or rhonchi.  Abdominal:     General: Bowel sounds are normal.     Palpations: Abdomen is soft.  Musculoskeletal:     Right lower leg: No edema.     Left  lower leg: No edema.  Lymphadenopathy:     Cervical: No cervical adenopathy.  Skin:    General: Skin is warm and dry.  Neurological:     General: No focal deficit present.     Mental Status: He is alert.  Psychiatric:        Mood and Affect: Mood normal.        Behavior: Behavior normal.        Thought Content: Thought content normal.        Judgment: Judgment normal.   CBC    Component Value Date/Time   WBC 5.6 03/01/2020 0817   RBC 4.68 03/01/2020 0817   HGB 15.2 03/01/2020 0817   HCT 45.1 03/01/2020 0817   PLT 196.0 03/01/2020 0817   MCV 96.2 03/01/2020 0817   MCHC 33.7 03/01/2020 0817   RDW 12.7 03/01/2020 0817   BMP Latest Ref Rng & Units 03/01/2020 08/10/2019 12/23/2018  Glucose 70 - 99 mg/dL 92 95 95  BUN 6 - 23 mg/dL 18 19 20   Creatinine 0.40 - 1.50 mg/dL 0.94 0.90 1.01  Sodium 135 - 145 mEq/L 143 138 140  Potassium 3.5 - 5.1 mEq/L 4.4 4.3 4.3  Chloride 96 - 112 mEq/L 104 102 103  CO2 19 - 32 mEq/L 35(H) 31 31  Calcium 8.4 - 10.5 mg/dL 9.0 9.0 9.2   Chest imaging: HRCT Chest 05/14/20 1. Pulmonary parenchymal pattern of fibrosis may be due to nonspecific interstitial pneumonitis. Usual interstitial pneumonitis is not excluded. Findings are indeterminate for UIP per consensus guidelines: Diagnosis of Idiopathic Pulmonary Fibrosis: An Official ATS/ERS/JRS/ALAT Clinical Practice Guideline. [place], Iss 5, [phone], Dec 20 2016. 2. Small to borderline enlarged mediastinal lymph nodes can be seen in the setting of interstitial lung disease. 3. Right renal stone.  Chest Radiograph 05/04/2018  Cardiac shadow is enlarged. Pacing device is noted. The lungs are well aerated bilaterally with some minimal interstitial changes. No focal confluent infiltrate or effusion is seen. No bony abnormality is noted.  PFT: PFT Results Latest Ref Rng & Units 09/10/2020 06/21/2020  FVC-Pre L 3.01 2.96  FVC-Predicted Pre % 70 68  FVC-Post L 2.99 2.87   FVC-Predicted Post % 69 66  Pre FEV1/FVC % % 94 93  Post FEV1/FCV % % 94 95  FEV1-Pre L 2.84 2.75  FEV1-Predicted Pre % 89 86  FEV1-Post L 2.81 2.74  DLCO uncorrected ml/min/mmHg 26.58 22.53  DLCO UNC% % 105 89  DLCO corrected ml/min/mmHg 26.58 22.53  DLCO COR %Predicted % 105 89  DLVA Predicted % 136 144  TLC L 4.64 4.02  TLC % Predicted % 68 58  RV % Predicted % 67 50  PFTs 2022: mild restrictive defect present.     Assessment & Plan:   IPF (idiopathic pulmonary fibrosis) ([place]) - Plan: Pulmonary function test, Ambulatory Referral for DME, Flutter valve  Discussion: William Curry is a 70 year old male, never smoker with with history of GERD who returns to pulmonary clinic for follow up of interstitial lung disease.   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  He has mild restrictive defect on pulmonary function test.  His pulmonary function tests have been stable since February of this year.  We will repeat pulmonary function tests in 3 months for monitoring purposes.  He wishes to hold off on starting Esbriet at this time until the next follow-up visit this fall.  He is to continue fluticasone nasal spray for nasal congestion and postnasal drainage.  For his cough symptoms we will order him a nebulizer machine and DuoNeb nebulizer solution to be used as needed every 4-6 hours followed by flutter valve therapy for bronchial hygiene.  He is to continue on pantoprazole 20 mg daily for GERD.  Follow up in 3 months.  Freda Jackson, MD [facility] Office: [phone]   Current Outpatient Medications:    fluticasone (FLONASE) 50 MCG/ACT nasal spray, SPRAY 1 SPRAY INTO BOTH NOSTRILS DAILY., Disp: 48 mL, Rfl: 2   Multiple Vitamin (MULTIVITAMIN) tablet, Take 1 tablet by mouth daily., Disp: , Rfl:    Multiple Vitamins-Minerals (PRESERVISION AREDS 2+MULTI VIT PO), Take 1 tablet 2 (two) times daily by mouth., Disp: , Rfl:    OVER THE  COUNTER MEDICATION, CBD  Oil daily, Disp: , Rfl:    pantoprazole (PROTONIX) 20 MG tablet, Take 1 tablet (20 mg total) by mouth daily., Disp: 30 tablet, Rfl: 6   sildenafil (REVATIO) 20 MG tablet, TAKE 3 TO 5 TABLETS BY MOUTH ONCE DAILY AS NEEDED, Disp: 50 tablet, Rfl: 2   terbinafine (LAMISIL) 1 % cream, Apply topically 2 (two) times daily as needed. , Disp: , Rfl:    valACYclovir (VALTREX) 500 MG tablet, Take 500 mg by mouth as needed. , Disp: , Rfl:    albuterol (VENTOLIN HFA) 108 (90 Base) MCG/ACT inhaler, Inhale 2 puffs into the lungs every 6 (six) hours as needed for wheezing or shortness of breath. (Patient not taking: Reported on 10/18/2020), Disp: 8 g, Rfl: 6

## 2020-10-19 ENCOUNTER — Ambulatory Visit (INDEPENDENT_AMBULATORY_CARE_PROVIDER_SITE_OTHER): Payer: 59

## 2020-10-19 DIAGNOSIS — I471 Supraventricular tachycardia: Secondary | ICD-10-CM

## 2020-10-19 DIAGNOSIS — I442 Atrioventricular block, complete: Secondary | ICD-10-CM

## 2020-10-23 ENCOUNTER — Telehealth: Payer: Self-pay

## 2020-10-23 NOTE — Telephone Encounter (Signed)
Carelink alert received for device "Nonbillable.  Scheduled remote reviewed. Normal device function. The device estimates 1 month until ERI, sent to triage". "Discussed battery behavior" noted in Telemedicine visit with Dr. Caryl Comes 05/07/20. At that time it was anticipated that battery life would last 1.7 years.   Unsuccessful telephone encounter to patient to discuss battery depletion and to schedule in-clinic appointment with Dr. Caryl Comes or EP/App to discuss gen change. Hipaa compliant VM message left requesting call back to [phone].

## 2020-10-23 NOTE — Telephone Encounter (Signed)
Pt left a voicemail @2 :92 returning nurse phone call. His phone number is [phone].

## 2020-10-24 ENCOUNTER — Telehealth: Payer: Self-pay | Admitting: Pulmonary Disease

## 2020-10-24 LAB — CUP PACEART REMOTE DEVICE CHECK
Battery Remaining Longevity: 1 mo
Battery Remaining Percentage: 0.5 %
Battery Voltage: 2.62 V
Brady Statistic AP VP Percent: 13 %
Brady Statistic AP VS Percent: 5.7 %
Brady Statistic AS VP Percent: 69 %
Brady Statistic AS VS Percent: 12 %
Brady Statistic RA Percent Paced: 19 %
Brady Statistic RV Percent Paced: 82 %
Date Time Interrogation Session: 20220701075314
Implantable Lead Implant Date: 20130115
Implantable Lead Implant Date: 20130115
Implantable Lead Location: 753859
Implantable Lead Location: 753860
Implantable Pulse Generator Implant Date: 20130115
Lead Channel Impedance Value: 390 Ohm
Lead Channel Impedance Value: 400 Ohm
Lead Channel Pacing Threshold Amplitude: 0.375 V
Lead Channel Pacing Threshold Amplitude: 1.375 V
Lead Channel Pacing Threshold Pulse Width: 0.4 ms
Lead Channel Pacing Threshold Pulse Width: 0.4 ms
Lead Channel Sensing Intrinsic Amplitude: 2.3 mV
Lead Channel Sensing Intrinsic Amplitude: 4.1 mV
Lead Channel Setting Pacing Amplitude: 1.375
Lead Channel Setting Pacing Amplitude: 1.625
Lead Channel Setting Pacing Pulse Width: 0.4 ms
Lead Channel Setting Sensing Sensitivity: 0.5 mV
Pulse Gen Model: 2210
Pulse Gen Serial Number: 7304172

## 2020-10-24 MED ORDER — IPRATROPIUM-ALBUTEROL 0.5-2.5 (3) MG/3ML IN SOLN: 3.0000 mL | Freq: Two times a day (BID) | RESPIRATORY_TRACT | Status: DC

## 2020-10-24 MED ORDER — IPRATROPIUM-ALBUTEROL 0.5-2.5 (3) MG/3ML IN SOLN: 3.0000 mL | Freq: Two times a day (BID) | RESPIRATORY_TRACT | 0 refills | Status: DC

## 2020-10-24 NOTE — Telephone Encounter (Signed)
Spoke with patient.  Discussed ERI and next steps.  Pt is scheduled for inclinic appt with Dr. Caryl Comes on 7/19.  He will keep that appt and understands that they will have discussion for battery changeout procedure.

## 2020-10-24 NOTE — Telephone Encounter (Signed)
Notified pt that Duonebs would be called into [facility] on [facility]. Also scheduled pt for PFT and OV in September. Pt had questions on operation of the nebulizer machine. Pt was instructed to call [employer] with assistance for machine operation. Order placed for Duonebs. Nothing further needed at this time.

## 2020-11-06 ENCOUNTER — Encounter: Payer: Self-pay | Admitting: Internal Medicine

## 2020-11-06 ENCOUNTER — Ambulatory Visit (INDEPENDENT_AMBULATORY_CARE_PROVIDER_SITE_OTHER): Payer: 59 | Admitting: Internal Medicine

## 2020-11-06 ENCOUNTER — Ambulatory Visit (HOSPITAL_COMMUNITY): Payer: 59 | Attending: Internal Medicine

## 2020-11-06 ENCOUNTER — Other Ambulatory Visit: Payer: Self-pay

## 2020-11-06 VITALS — BP 120/80 | HR 71 | Ht 69.0 in | Wt 168.0 lb

## 2020-11-06 DIAGNOSIS — I453 Trifascicular block: Secondary | ICD-10-CM | POA: Diagnosis not present

## 2020-11-06 DIAGNOSIS — I429 Cardiomyopathy, unspecified: Secondary | ICD-10-CM | POA: Diagnosis not present

## 2020-11-06 DIAGNOSIS — I442 Atrioventricular block, complete: Secondary | ICD-10-CM | POA: Diagnosis not present

## 2020-11-06 DIAGNOSIS — I447 Left bundle-branch block, unspecified: Secondary | ICD-10-CM

## 2020-11-06 DIAGNOSIS — I471 Supraventricular tachycardia: Secondary | ICD-10-CM

## 2020-11-06 DIAGNOSIS — Z950 Presence of cardiac pacemaker: Secondary | ICD-10-CM

## 2020-11-06 LAB — ECHOCARDIOGRAM COMPLETE
Area-P 1/2: 5.58 cm2
S' Lateral: 4.25 cm

## 2020-11-06 NOTE — Progress Notes (Signed)
Patient ID: William Curry, male   DOB: 1950/09/17, 70 y.o.   MRN: [id number]      Patient Care Team: Darreld Mclean, MD as PCP - General (Family Medicine)   HPI  Daveyon Kitchings Curry is a 70 y.o. 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  There have been no palpitations, lightheadedness or syncope.  notes interval diagnosis of interstitial lung disease.  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 Right Knee   Tinea versicolor     Past Surgical History:  Procedure Laterality Date   CARTILAGE REPAIR  Nov.-1989   Left knee-HARD TO WAKE UP AFTER SX   COLONOSCOPY     KNEE ARTHROSCOPY WITH ANTERIOR CRUCIATE LIGAMENT (ACL) REPAIR  [phone]   Left knee-HARD TO WAKE UP AFTER SX   LITHOTRIPSY  Jun-2010   Kidney stone (1st stone 1994 - Passed)   PACEMAKER INSTALLATION  Jan-2013   Complete Heart Block   SKIN BIOPSY     RIght knee for skin cancer    Current Meds  Medication Sig   fluticasone (FLONASE) 50 MCG/ACT nasal spray SPRAY 1 SPRAY INTO BOTH NOSTRILS DAILY.   ipratropium-albuterol (DUONEB) 0.5-2.5 (3) MG/3ML SOLN Take 3 mLs by nebulization in  the morning and at bedtime.   Multiple Vitamin (MULTIVITAMIN) tablet Take 1 tablet by mouth daily.   Multiple Vitamins-Minerals (PRESERVISION AREDS 2+MULTI VIT PO) Take 1 tablet 2 (two) times daily by mouth.   OVER THE COUNTER MEDICATION CBD  Oil daily   pantoprazole (PROTONIX) 20 MG tablet Take 1 tablet (20 mg total) by mouth daily.   sildenafil (REVATIO) 20 MG tablet TAKE 3 TO 5 TABLETS BY MOUTH ONCE DAILY AS NEEDED   terbinafine (LAMISIL) 1 % cream Apply topically 2 (two) times daily as needed.    valACYclovir (VALTREX) 500 MG tablet Take 500 mg by mouth as needed.     Allergies  Allergen Reactions   Penicillins Other (See Comments)    As a child    Review of Systems negative except from HPI and PMH  Physical Exam: BP 120/80 (BP Location: Left Arm, Patient Position: Sitting, Cuff Size:  Normal)   Pulse 71   Ht 5\' 9"  (1.753 m)   Wt 168 lb (76.2 kg)   SpO2 97%   BMI 24.81 kg/m  Well developed and well nourished in no acute distress HENT normal Neck supple with JVP-flat Lungs Crackles L >R  Device pocket well healed; without hematoma or erythema.  There is no tethering  Regular rate and rhythm, no/ murmur Abd-soft with active BS No Clubbing cyanosis  edema Skin-warm and dry A & Oriented  Grossly normal sensory and motor function  ECG sinus with P-synchronous/ AV  pacing     Assessment and  Plan:  Atrial tach   Intermittent complete heart block   Trifascicular block     Pacemaker  St Jude   Device is approaching ERI.  We have reviewed the benefits and risks of generator replacement.  These include but are not limited to lead fracture and infection.  The patient understands, agrees and is willing to proceed.    Complete heart block is intermittent with about 82% ventricular pacing  Await echocardiogram for the assessment of possible pacemaker associated cardiomyopathy     Current medicines are reviewed at length with the patient today .  The patient does not  have concerns regarding medicines.

## 2020-11-06 NOTE — Patient Instructions (Signed)
Medication Instructions:  Your physician recommends that you continue on your current medications as directed. Please refer to the Current Medication list given to you today.  *If you need a refill on your cardiac medications before your next appointment, please call your pharmacy*   Lab Work: None ordered.  If you have labs (blood work) drawn today and your tests are completely normal, you will receive your results only by: [place] (if you have MyChart) OR A paper copy in the mail If you have any lab test that is abnormal or we need to change your treatment, we will call you to review the results.   Testing/Procedures: Your physician has recommended that you have a pacemaker generator change. A pacemaker is a small device that is placed under the skin of your chest or abdomen to help control abnormal heart rhythms. This device uses electrical pulses to prompt the heart to beat at a normal rate. Pacemakers are used to treat heart rhythms that are too slow. Wire (leads) are attached to the pacemaker that goes into the chambers of you heart. This is done in the hospital and usually requires and overnight stay. Please see the instruction sheet given to you today for more information.     Follow-Up: At [facility], you and your health needs are our priority.  As part of our continuing mission to provide you with exceptional heart care, we have created designated Provider Care Teams.  These Care Teams include your primary Cardiologist (physician) and Advanced Practice Providers (APPs -  Physician Assistants and Nurse Practitioners) who all work together to provide you with the care you need, when you need it.  We recommend signing up for the patient portal called "MyChart".  Sign up information is provided on this After Visit Summary.  MyChart is used to connect with patients for Virtual Visits (Telemedicine).  Patients are able to view lab/test results, encounter notes, upcoming  appointments, etc.  Non-urgent messages can be sent to your provider as well.   To learn more about what you can do with MyChart, go to [email or web address].    Your next appointment:    To be scheduled

## 2020-11-07 NOTE — Progress Notes (Signed)
Remote pacemaker transmission.   

## 2020-11-07 NOTE — Addendum Note (Signed)
Addended by: Douglass Rivers D on: 11/07/2020 02:28 PM   Modules accepted: Level of Service

## 2020-11-14 ENCOUNTER — Ambulatory Visit: Payer: 59 | Attending: Internal Medicine

## 2020-11-14 DIAGNOSIS — Z23 Encounter for immunization: Secondary | ICD-10-CM

## 2020-11-14 DIAGNOSIS — Z950 Presence of cardiac pacemaker: Secondary | ICD-10-CM

## 2020-11-14 DIAGNOSIS — Z01812 Encounter for preprocedural laboratory examination: Secondary | ICD-10-CM

## 2020-11-14 DIAGNOSIS — I453 Trifascicular block: Secondary | ICD-10-CM

## 2020-11-14 DIAGNOSIS — I429 Cardiomyopathy, unspecified: Secondary | ICD-10-CM

## 2020-11-14 NOTE — Telephone Encounter (Signed)
Return phone call from pt.  Reviewed generator change letter instructions with pt.  See letter for complete details.  Hard copy of letter mailed to pt along with surgical scrub instructions and advised pt to pick up bottle of surgical scrub when he comes to the office for labs.  Pt verbalizes understanding and agrees with current plan.

## 2020-11-14 NOTE — Progress Notes (Signed)
   Covid-19 Vaccination Clinic  Name:  William Curry    MRN: [id number] DOB: Feb 27, 1951  11/14/2020  [name] was observed post Covid-19 immunization for 15 minutes without incident. He was provided with Vaccine Information Sheet and instruction to access the V-Safe system.   [name] was instructed to call 911 with any severe reactions post vaccine: Difficulty breathing  Swelling of face and throat  A fast heartbeat  A bad rash all over body  Dizziness and weakness   Immunizations Administered     Name Date Dose VIS Date Route   Moderna Covid-19 Booster Vaccine 11/14/2020 10:32 AM 0.25 mL 02/08/2020 Intramuscular   Manufacturer: Moderna   Lot: [id number]   [id number]

## 2020-11-14 NOTE — Telephone Encounter (Signed)
Attempted phone call to pt to discuss pacemaker generator change instructions.  Left voicemail message to contact RN at [phone].

## 2020-11-16 ENCOUNTER — Telehealth: Payer: Self-pay

## 2020-11-16 ENCOUNTER — Other Ambulatory Visit: Payer: Self-pay | Admitting: Gastroenterology

## 2020-11-16 DIAGNOSIS — I429 Cardiomyopathy, unspecified: Secondary | ICD-10-CM

## 2020-11-16 DIAGNOSIS — Z79899 Other long term (current) drug therapy: Secondary | ICD-10-CM

## 2020-11-16 MED ORDER — SACUBITRIL-VALSARTAN 24-26 MG PO TABS: 1.0000 | ORAL_TABLET | Freq: Two times a day (BID) | ORAL | 3 refills | Status: DC

## 2020-11-16 NOTE — Telephone Encounter (Signed)
-----   Message from Deboraha Sprang, MD sent at 11/14/2020  5:20 PM EDT ----- Please Inform Patient Echo showed significant worsening of  heart muscle function from 2013 where it was normal, but now 30-35%.  A variety of possible explanations, the pacemaker is one, but this by default.  Need to exclude other things.  Lets start with myoview to look at CAD   For the LV dysfunction, appropriate to think about meds to help with this to protect against consequences and hope for improvement.  Lets try entresto 24/26  with a bmet in 2 weeks   Thanks SK

## 2020-11-16 NOTE — Telephone Encounter (Signed)
Spoke with pt and advised per Dr Caryl Comes echo shows worsening of heart muscle function.  In 2013 heart function was normal and now it is 30-35%.  Dr Caryl Comes recommends further testing to determine possible explanations.  Will order myoview and someone will call you regarding scheduling.  Pt will need to also start Entresto 24/26 with labs in 2 weeks.  Appointment scheduled for BMET on 12/04/2020.  Pt verbalizes understanding and agrees with current plan.

## 2020-11-19 ENCOUNTER — Ambulatory Visit (INDEPENDENT_AMBULATORY_CARE_PROVIDER_SITE_OTHER): Payer: 59

## 2020-11-19 DIAGNOSIS — I442 Atrioventricular block, complete: Secondary | ICD-10-CM

## 2020-11-20 ENCOUNTER — Other Ambulatory Visit (HOSPITAL_BASED_OUTPATIENT_CLINIC_OR_DEPARTMENT_OTHER): Payer: Self-pay

## 2020-11-20 LAB — CUP PACEART REMOTE DEVICE CHECK
Battery Remaining Longevity: 1 mo
Battery Remaining Percentage: 0.5 %
Battery Voltage: 2.59 V
Brady Statistic AP VP Percent: 13 %
Brady Statistic AP VS Percent: 1 %
Brady Statistic AS VP Percent: 87 %
Brady Statistic AS VS Percent: 1 %
Brady Statistic RA Percent Paced: 13 %
Brady Statistic RV Percent Paced: 99 %
Date Time Interrogation Session: 20220801040813
Implantable Lead Implant Date: 20130115
Implantable Lead Implant Date: 20130115
Implantable Lead Location: 753859
Implantable Lead Location: 753860
Implantable Pulse Generator Implant Date: 20130115
Lead Channel Impedance Value: 350 Ohm
Lead Channel Impedance Value: 390 Ohm
Lead Channel Pacing Threshold Amplitude: 0.5 V
Lead Channel Pacing Threshold Amplitude: 1.75 V
Lead Channel Pacing Threshold Pulse Width: 0.4 ms
Lead Channel Pacing Threshold Pulse Width: 0.4 ms
Lead Channel Sensing Intrinsic Amplitude: 3 mV
Lead Channel Sensing Intrinsic Amplitude: 4.8 mV
Lead Channel Setting Pacing Amplitude: 1.5 V
Lead Channel Setting Pacing Amplitude: 2 V
Lead Channel Setting Pacing Pulse Width: 0.4 ms
Lead Channel Setting Sensing Sensitivity: 0.5 mV
Pulse Gen Model: 2210
Pulse Gen Serial Number: 7304172

## 2020-11-20 MED ORDER — COVID-19 MRNA VACC (MODERNA) 100 MCG/0.5ML IM SUSP
INTRAMUSCULAR | 0 refills | Status: DC
  Filled 2020-11-20: qty 0.25, 1d supply, fill #0

## 2020-11-21 ENCOUNTER — Telehealth (HOSPITAL_COMMUNITY): Payer: Self-pay | Admitting: *Deleted

## 2020-11-21 NOTE — Telephone Encounter (Signed)
Patient given detailed instructions per Myocardial Perfusion Study Information Sheet for the test on 11/26/20 at 7:15. Patient notified to arrive 15 minutes early and that it is imperative to arrive on time for appointment to keep from having the test rescheduled.  If you need to cancel or reschedule your appointment, please call the office within 24 hours of your appointment. . Patient verbalized understanding.William Curry

## 2020-11-22 ENCOUNTER — Other Ambulatory Visit: Payer: Self-pay | Admitting: Internal Medicine

## 2020-11-26 ENCOUNTER — Ambulatory Visit (HOSPITAL_COMMUNITY): Payer: 59 | Attending: Internal Medicine

## 2020-11-26 ENCOUNTER — Other Ambulatory Visit: Payer: Self-pay

## 2020-11-26 DIAGNOSIS — I429 Cardiomyopathy, unspecified: Secondary | ICD-10-CM | POA: Diagnosis not present

## 2020-11-26 LAB — MYOCARDIAL PERFUSION IMAGING
LV dias vol: 159 mL (ref 62–150)
LV sys vol: 101 mL
Peak HR: 95 {beats}/min
Rest HR: 65 {beats}/min
SDS: 7
SRS: 2
SSS: 9
TID: 1.03

## 2020-11-26 MED ORDER — TECHNETIUM TC 99M TETROFOSMIN IV KIT
32.7000 | PACK | Freq: Once | INTRAVENOUS | Status: AC | PRN
  Administered 2020-11-26: 32.7 via INTRAVENOUS
  Filled 2020-11-26: qty 33

## 2020-11-26 MED ORDER — REGADENOSON 0.4 MG/5ML IV SOLN
0.4000 mg | Freq: Once | INTRAVENOUS | Status: AC
  Administered 2020-11-26: 0.4 mg via INTRAVENOUS

## 2020-11-26 MED ORDER — TECHNETIUM TC 99M TETROFOSMIN IV KIT
10.9000 | PACK | Freq: Once | INTRAVENOUS | Status: AC | PRN
  Administered 2020-11-26: 10.9 via INTRAVENOUS
  Filled 2020-11-26: qty 11

## 2020-12-04 ENCOUNTER — Other Ambulatory Visit: Payer: 59 | Admitting: *Deleted

## 2020-12-04 ENCOUNTER — Other Ambulatory Visit: Payer: Self-pay

## 2020-12-04 ENCOUNTER — Telehealth: Payer: Self-pay

## 2020-12-04 DIAGNOSIS — I429 Cardiomyopathy, unspecified: Secondary | ICD-10-CM

## 2020-12-04 DIAGNOSIS — Z79899 Other long term (current) drug therapy: Secondary | ICD-10-CM

## 2020-12-04 LAB — BASIC METABOLIC PANEL
BUN/Creatinine Ratio: 15 (ref 10–24)
BUN: 15 mg/dL (ref 8–27)
CO2: 26 mmol/L (ref 20–29)
Calcium: 9 mg/dL (ref 8.6–10.2)
Chloride: 100 mmol/L (ref 96–106)
Creatinine, Ser: 0.99 mg/dL (ref 0.76–1.27)
Glucose: 92 mg/dL (ref 65–99)
Potassium: 4.2 mmol/L (ref 3.5–5.2)
Sodium: 140 mmol/L (ref 134–144)
eGFR: 82 mL/min/{1.73_m2} (ref >59)

## 2020-12-04 NOTE — Telephone Encounter (Signed)
Spoke with pt and reviewed Stress test results per Dr Caryl Comes with pt and advised pt Dr Caryl Comes would like a virtual visit with pt on Thursday 12/06/2020 at 820am to further discuss.  Pt verbalizes understanding and is agreeable to current plan.

## 2020-12-04 NOTE — Telephone Encounter (Signed)
-----   Message from Deboraha Sprang, MD sent at 12/03/2020  5:46 PM EDT ----- Please Inform Patient that stress test showed no evidence of ischemia, supporting evidence for moderate heart muscle weakness at about 30-35% and suggestion of prior MI--  I tried to call pt 8/15 with no answer and left no message but wold like to suggest telehealth visit or OV to review as I think cath would be the right next step and that should proceed the device replacement   Thanks

## 2020-12-06 ENCOUNTER — Other Ambulatory Visit: Payer: Self-pay

## 2020-12-06 ENCOUNTER — Telehealth (INDEPENDENT_AMBULATORY_CARE_PROVIDER_SITE_OTHER): Payer: 59 | Admitting: Internal Medicine

## 2020-12-06 VITALS — BP 113/71 | HR 72 | Ht 69.0 in | Wt 165.0 lb

## 2020-12-06 DIAGNOSIS — Z950 Presence of cardiac pacemaker: Secondary | ICD-10-CM

## 2020-12-06 DIAGNOSIS — I453 Trifascicular block: Secondary | ICD-10-CM | POA: Diagnosis not present

## 2020-12-06 DIAGNOSIS — I471 Supraventricular tachycardia: Secondary | ICD-10-CM | POA: Diagnosis not present

## 2020-12-06 DIAGNOSIS — I447 Left bundle-branch block, unspecified: Secondary | ICD-10-CM

## 2020-12-06 DIAGNOSIS — R9430 Abnormal result of cardiovascular function study, unspecified: Secondary | ICD-10-CM

## 2020-12-06 MED ORDER — BISOPROLOL FUMARATE 5 MG PO TABS: 2.5000 mg | ORAL_TABLET | Freq: Every day | ORAL | 3 refills | Status: DC

## 2020-12-06 MED ORDER — ASPIRIN EC 81 MG PO TBEC: 81.0000 mg | DELAYED_RELEASE_TABLET | Freq: Every day | ORAL | 11 refills | Status: DC

## 2020-12-06 NOTE — H&P (View-Only) (Signed)
Electrophysiology TeleHealth Note   Due to national recommendations of social distancing due to COVID 19, an audio/video telehealth visit is felt to be most appropriate for this patient at this time.  See MyChart message from today for the patient's consent to telehealth for [facility]   Date:  12/06/2020   ID:  William Curry, DOB Jul 09, 1950, MRN [id number]  Location: patient's home  Provider location: [address], [place]  Evaluation Performed: Follow-up visit  PCP:  Copland, Gay Filler, MD  Cardiologist:   none Electrophysiologist:  SK   Chief Complaint:  abnormal imaging studies   History of Present Illness:    William Curry is a 70 y.o. male who presents via audio/video conferencing for a telehealth visit today.  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  This visit is to discuss the below outlined imaging tests  Because of high burden RV pacing (80%) imaging >> LV dysfunction new, with WMA and myoview>> perfusion defect consistent with MI  Intercurrent addition of entresto which he is tolerating well.  Shortness of breath is stable; he has been diagnosed with interstitial lung disease and is being followed by [facility].  No chest pain or peripheral edema  DATE PR QRS Pacing %  2/16     16  4/17 228 138 15  4/18 218 142 10  4/19 250 140 23  4/20     73        Date   Cr              K         Hgb    11/18 1.06 4.5      9/20 1.01 4.3         16.1    11/21 0.94 4.4          15.2     8/22 0.99 4.2 15.2      DATE TEST EF%    01/13 Echo  60-65 %    07/22 Echo 30-35%     7/22 Myoview  30-35% Inferior perfusion defect     The patient denies symptoms of fevers, chills, cough, or new SOB worrisome for COVID 19.    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 Right Knee   Tinea versicolor     Past Surgical History:  Procedure Laterality Date   CARTILAGE REPAIR  Nov.-1989   Left knee-HARD TO WAKE UP AFTER SX   COLONOSCOPY     KNEE ARTHROSCOPY WITH ANTERIOR CRUCIATE LIGAMENT (ACL) REPAIR  [phone]   Left knee-HARD TO WAKE UP AFTER SX   LITHOTRIPSY  Jun-2010   Kidney stone (1st stone 1994 - Passed)   PACEMAKER INSTALLATION  Jan-2013   Complete Heart Block   SKIN BIOPSY     RIght knee for skin cancer    Current Outpatient Medications  Medication Sig Dispense Refill   COVID-19 mRNA vaccine, Moderna, 100  MCG/0.5ML injection Inject into the muscle. 0.25 mL 0   fluticasone (FLONASE) 50 MCG/ACT nasal spray SPRAY 1 SPRAY INTO BOTH NOSTRILS DAILY. 48 mL 2   ipratropium-albuterol (DUONEB) 0.5-2.5 (3) MG/3ML SOLN Take 3 mLs by nebulization in the morning and at bedtime. 360 mL 0   Multiple Vitamin (MULTIVITAMIN) tablet Take 1 tablet by mouth daily.     Multiple Vitamins-Minerals (PRESERVISION AREDS 2+MULTI VIT PO) Take 1 tablet 2 (two) times daily by mouth.     OVER THE COUNTER MEDICATION CBD  Oil daily     pantoprazole (PROTONIX) 20 MG tablet TAKE 1 TABLET BY MOUTH EVERY DAY 30 tablet 2   sacubitril-valsartan (ENTRESTO) 24-26 MG Take 1 tablet by mouth 2 (two) times daily. 180 tablet 3   sildenafil (REVATIO) 20 MG tablet TAKE 3 TO 5 TABLETS BY MOUTH ONCE DAILY AS NEEDED 50 tablet 2   terbinafine (LAMISIL) 1 % cream Apply topically 2 (two) times daily as needed.      valACYclovir (VALTREX) 500 MG tablet Take 500 mg by mouth as needed.      No current facility-administered medications for this visit.    Allergies:   Penicillins      ROS:  Please see the history of present illness.   All other systems are personally reviewed and negative.    Exam:    Vital Signs:  BP 113/71   Pulse 72   Ht '5\' 9"'$  (1.753 m)   Wt 165 lb (74.8 kg)   BMI 24.37 kg/m     Well appearing, alert and conversant, regular work of breathing,  good skin color Eyes- anicteric, neuro- grossly intact, skin- no apparent rash or lesions or cyanosis, mouth- oral mucosa is pink   Labs/Other Tests and Data Reviewed:    Recent Labs: 03/01/2020: ALT 14; Hemoglobin 15.2; Platelets 196.0 12/04/2020: BUN 15; Creatinine, Ser 0.99; Potassium 4.2; Sodium 140   Wt Readings from Last 3 Encounters:  12/06/20 165 lb (74.8 kg)  11/26/20 168 lb (76.2 kg)  11/06/20 168 lb (76.2 kg)     Other studies personally reviewed: Additional studies/ records that were reviewed today include: (As above)   Review of the above records today demonstrates:   Prior radiographs: (As above) reviewed images with patient     ASSESSMENT & PLAN:    Atrial tach   Intermittent complete heart block   Trifascicular block     Pacemaker  St Jude  Cardiomyopathy, newly identified, presumably ischemic  Intercurrent identification of a cardiomyopathy, presumed ischemic based on a fixed perfusion defect off of his Myoview scan.  We discussed the potential causes of this including the possibility that there are multiple contributors including pacemaker cardiomyopathy as well as ischemia.  First step would be to elucidate coronary artery disease burden.  We have discussed catheterizations its benefits and risks including but not limited to stroke heart attack and death.  He understands these risks and is willing to proceed.  Following the information garnered above, we would then proceed with device generator revision either utilizing a surgically implanted LV lead or anticipating endovascular resynchronization with or without an ICD lead, and in this regard with his young age would  want to consider extraction of his RV pacing lead.  He is tolerating Entresto for his cardiomyopathy, we will continue this at 24/26.  With the concern of ischemic heart disease, we will begin on aspirin 81 as well as bisoprolol 2.5 daily, choosing this 1 given his high degree of selectivity  COVID 19 screen The patient denies symptoms of COVID 19 at this time.  The importance of social distancing was discussed today.  Follow-up:  35mTHV    Current medicines are reviewed at length with the patient today.   The patient does not have concerns regarding his medicines.  The following changes were made today:  (As above)   Labs/ tests ordered today include:  Cath  No orders of the defined types were placed in this encounter.   Future tests ( post COVID )   months  Patient Risk:  after full review of this patients clinical status, I feel that they are at moderate  risk at this time.  Today, I have spent 24 minutes with the patient with telehealth technology discussing the above.  Signed, SVirl Axe MD  12/06/2020 8:33 AM     [place] [phone](office) [phone](fax)

## 2020-12-06 NOTE — Telephone Encounter (Signed)
Spoke with pt and  reviewed cardiac cath instructions with pt.  See letter for complete instructions.  Pt verbalized understanding and agrees with current plan.

## 2020-12-06 NOTE — Patient Instructions (Addendum)
Medication Instructions:  - Your physician has recommended you make the following change in your medication:   1) START aspirin 81 mg- take 1 tablet by mouth once daily   2) START bisoprolol 5 mg- take 0.5 tablet (2.5 mg) by mouth once daily -   *If you need a refill on your cardiac medications before your next appointment, please call your pharmacy*   Lab Work: - see cardiac cath instructions  If you have labs (blood work) drawn today and your tests are completely normal, you will receive your results only by: [place] (if you have MyChart) OR A paper copy in the mail If you have any lab test that is abnormal or we need to change your treatment, we will call you to review the results.   Testing/Procedures: - Your physician has requested that you have a cardiac catheterization. Cardiac catheterization is used to diagnose and/or treat various heart conditions. Doctors may recommend this procedure for a number of different reasons. The most common reason is to evaluate chest pain. Chest pain can be a symptom of coronary artery disease (CAD), and cardiac catheterization can show whether plaque is narrowing or blocking your heart's arteries. This procedure is also used to evaluate the valves, as well as measure the blood flow and oxygen levels in different parts of your heart.   You are scheduled for a Cardiac Catheterization on Tuesday, August 30 with Dr. Daneen Schick.  1. Please arrive at the [facility] (Main Entrance A) at [facility]: [address] [place], [place] [address] at 5:30 AM (This time is two hours before your procedure to ensure your preparation). Free valet parking service is available.   Special note: Every effort is made to have your procedure done on time. Please understand that emergencies sometimes delay scheduled procedures.  2. Diet: Do not eat solid foods after midnight and hydrate well the night before you procedure.  3. Labs: You will need to have  blood drawn and an EKG on, August 26 at 2pm at [facility] at [facility]. [address]  Open: 7:30am - 5pm    Phone: [phone]. You do not need to be fasting.  4. Medication instructions in preparation for your procedure:   Contrast Allergy: No    On the morning of your procedure, take your Aspirin and any morning medicines NOT listed above.  You may use sips of water.  5. Plan for one night stay--bring personal belongings.  6. Bring a current list of your medications and current insurance cards.  7. You MUST have a responsible person to drive you home.  8. Someone MUST be with you the first 24 hours after you arrive home or your discharge will be delayed.  9. Please wear clothes that are easy to get on and off and wear slip-on shoes.  Thank you for allowing [place] to care for you!   -- [facility]     Follow-Up: At [facility], you and your health needs are our priority.  As part of our continuing mission to provide you with exceptional heart care, we have created designated Provider Care Teams.  These Care Teams include your primary Cardiologist (physician) and Advanced Practice Providers (APPs -  Physician Assistants and Nurse Practitioners) who all work together to provide you with the care you need, when you need it.  We recommend signing up for the patient portal called "MyChart".  Sign up information is provided  on this After Visit Summary.  MyChart is used to connect with patients for Virtual Visits (Telemedicine).  Patients are able to view lab/test results, encounter notes, upcoming appointments, etc.  Non-urgent messages can be sent to your provider as well.   To learn more about what you can do with MyChart, go to [email or web address].    Your next appointment:   2 month(s)  The format for your next appointment:   Virtual Visit   Provider:   Virl Axe, MD   Other Instructions  Coronary  Angiogram A coronary angiogram is an X-ray procedure that is used to examine the arteries in the heart. Contrast dye is injected through a long, thin tube (catheter) into these arteries. Then X-rays are taken to show any blockage in thesearteries. You may have this procedure if you: Are having chest pain, or other symptoms of angina, and you are at risk for heart disease. Have an abnormal stress test or test of your heart's electrical activity (electrocardiogram, or ECG). Have chest pain and heart failure. Are having irregular heart rhythms. A coronary angiogram or heart catheterization can show if you have valve disease or a disease of the aorta. This procedure can also be used to check theoverall function of your heart muscle. Let your health care provider know about: Any allergies you have, including allergies to medicines or contrast dye. All medicines you are taking, including vitamins, herbs, eye drops, creams, and over-the-counter medicines. Any problems you or family members have had with anesthetic medicines. Any blood disorders you have. Any surgeries you have had. Any history of kidney problems or kidney failure. Any medical conditions you have. Whether you are pregnant or may be pregnant. Whether you are breastfeeding. What are the risks? Generally, this is a safe procedure. However, problems may occur, including: Infection. Allergic reaction to medicines or dyes that are used. Bleeding from the insertion site or other places. Damage to nearby structures, such as blood vessels, or damage to kidneys from contrast dye. Irregular heart rhythms. Stroke (rare). Heart attack (rare). What happens before the procedure? Staying hydrated Follow instructions from your health care provider about hydration, which may include: Up to 2 hours before the procedure - you may continue to drink clear liquids, such as water, clear fruit juice, black coffee, and plain tea.  Eating and drinking  restrictions Follow instructions from your health care provider about eating and drinking, which may include: 8 hours before the procedure - stop eating heavy meals or foods, such as meat, fried foods, or fatty foods. 6 hours before the procedure - stop eating light meals or foods, such as toast or cereal. 6 hours before the procedure - stop drinking milk or drinks that contain milk. 2 hours before the procedure - stop drinking clear liquids. Medicines Ask your health care provider about: Changing or stopping your regular medicines. This is especially important if you are taking diabetes medicines or blood thinners. Taking medicines such as aspirin and ibuprofen. These medicines can thin your blood. Do not take these medicines unless your health care provider tells you to take them. Aspirin may be recommended before coronary angiograms even if you do not normally take it. Taking over-the-counter medicines, vitamins, herbs, and supplements. General instructions Do not use any products that contain nicotine or tobacco for at least 4 weeks before the procedure. These products include cigarettes, e-cigarettes, and chewing tobacco. If you need help quitting, ask your health care provider. You may have an exam or testing. Plan to have  someone take you home from the hospital or clinic. If you will be going home right after the procedure, plan to have someone with you for 24 hours. Ask your health care provider: How your insertion site will be marked. What steps will be taken to help prevent infection. These may include: Removing hair at the insertion site. Washing skin with a germ-killing soap. Taking antibiotic medicine. What happens during the procedure?  You will lie on your back on an X-ray table. An IV will be inserted into one of your veins. Electrodes will be placed on your chest. You will be given one or more of the following: A medicine to help you relax (sedative). A medicine to numb  the catheter insertion area (local anesthetic). You will be connected to a continuous ECG monitor. The catheter will be inserted into an artery in one of these areas: Your groin area in your upper thigh. Your wrist. The fold of your arm, near your elbow. An X-ray procedure (fluoroscopy) will be used to help guide the catheter to the opening of the blood vessel to be used. A dye will be injected into the catheter and X-rays will be taken. The dye will help to show any narrowing or blockages in the heart arteries. Tell your health care provider if you have chest pain or trouble breathing. If blockages are found, another procedure may be done to open the artery. The catheter will be removed after the fluoroscopy is complete. A bandage (dressing) will be placed over the insertion site. Pressure will be applied to stop bleeding. The IV will be removed. The procedure may vary among health care providers and hospitals. What happens after the procedure? Your blood pressure, heart rate, breathing rate, and blood oxygen level will be monitored until you leave the hospital or clinic. You will need to lie still for a few hours, or for as long as told by your health care provider. If the procedure is done through the groin, you will be told not to bend or cross your legs. The insertion site and the pulse in your foot or wrist will be checked often. More blood tests, X-rays, and an ECG may be done. Do not drive for 24 hours if you were given a sedative during your procedure. Summary A coronary angiogram is an X-ray procedure that is used to examine the arteries in the heart. Contrast dye is injected through a long, thin tube (catheter) into each artery. Tell your health care provider about any allergies you have, including allergies to contrast dye. After the procedure, you will need to lie still for a few hours and drink plenty of fluids. This information is not intended to replace advice given to you  by your health care provider. Make sure you discuss any questions you have with your healthcare provider. Document Revised: 10/28/2018 Document Reviewed: 10/28/2018 Elsevier Patient Education  [place].

## 2020-12-06 NOTE — Progress Notes (Signed)
Electrophysiology TeleHealth Note   Due to national recommendations of social distancing due to COVID 19, an audio/video telehealth visit is felt to be most appropriate for this patient at this time.  See MyChart message from today for the patient's consent to telehealth for [facility].   Date:  12/06/2020   ID:  William Curry, DOB 11-Jul-1950, MRN [id number]  Location: patient's home  Provider location: [address] [place]  Evaluation Performed: Follow-up visit  PCP:  Copland, Gay Filler, MD  Cardiologist:   none Electrophysiologist:  SK   Chief Complaint:  abnormal imaging studies   History of Present Illness:    William Curry is a 70 y.o. male who presents via audio/video conferencing for a telehealth visit today.  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  This visit is to discuss the below outlined imaging tests  Because of high burden RV pacing (80%) imaging >> LV dysfunction new, with WMA and myoview>> perfusion defect consistent with MI  Intercurrent addition of entresto which he is tolerating well.  Shortness of breath is stable; he has been diagnosed with interstitial lung disease and is being followed by [facility].  No chest pain or peripheral edema  DATE PR QRS Pacing %  2/16     16  4/17 228 138 15  4/18 218 142 10  4/19 250 140 23  4/20     73        Date   Cr              K         Hgb    11/18 1.06 4.5      9/20 1.01 4.3         16.1    11/21 0.94 4.4          15.2     8/22 0.99 4.2 15.2      DATE TEST EF%    01/13 Echo  60-65 %    07/22 Echo 30-35%     7/22 Myoview  30-35% Inferior perfusion defect     The patient denies symptoms of fevers, chills, cough, or new SOB worrisome for COVID 19.    Past Medical History:  Diagnosis Date   DDD (degenerative disc disease), lumbar     GERD (gastroesophageal reflux disease)    Heart block AV complete ([place]) 2013   Pacemaker   History of chicken pox    Hypertension 2002-2005   Kidney stone 1994-2010   Passed stone in 1994, Lithotripsy in 2010, stones passed in 2018   Macular degeneration    Scoliosis    Skin cancer    Squamous cell carcomina. Right Knee   Tinea versicolor     Past Surgical History:  Procedure Laterality Date   CARTILAGE REPAIR  Nov.-1989   Left knee-HARD TO WAKE UP AFTER SX   COLONOSCOPY     KNEE ARTHROSCOPY WITH ANTERIOR CRUCIATE LIGAMENT (ACL) REPAIR  [phone]   Left knee-HARD TO WAKE UP AFTER SX   LITHOTRIPSY  Jun-2010   Kidney stone (1st stone 1994 - Passed)   PACEMAKER INSTALLATION  Jan-2013   Complete Heart Block   SKIN BIOPSY     RIght knee for skin cancer    Current Outpatient Medications  Medication Sig Dispense Refill   COVID-19 mRNA vaccine, Moderna, 100  MCG/0.5ML injection Inject into the muscle. 0.25 mL 0   fluticasone (FLONASE) 50 MCG/ACT nasal spray SPRAY 1 SPRAY INTO BOTH NOSTRILS DAILY. 48 mL 2   ipratropium-albuterol (DUONEB) 0.5-2.5 (3) MG/3ML SOLN Take 3 mLs by nebulization in the morning and at bedtime. 360 mL 0   Multiple Vitamin (MULTIVITAMIN) tablet Take 1 tablet by mouth daily.     Multiple Vitamins-Minerals (PRESERVISION AREDS 2+MULTI VIT PO) Take 1 tablet 2 (two) times daily by mouth.     OVER THE COUNTER MEDICATION CBD  Oil daily     pantoprazole (PROTONIX) 20 MG tablet TAKE 1 TABLET BY MOUTH EVERY DAY 30 tablet 2   sacubitril-valsartan (ENTRESTO) 24-26 MG Take 1 tablet by mouth 2 (two) times daily. 180 tablet 3   sildenafil (REVATIO) 20 MG tablet TAKE 3 TO 5 TABLETS BY MOUTH ONCE DAILY AS NEEDED 50 tablet 2   terbinafine (LAMISIL) 1 % cream Apply topically 2 (two) times daily as needed.      valACYclovir (VALTREX) 500 MG tablet Take 500 mg by mouth as needed.      No current facility-administered medications for this visit.    Allergies:   Penicillins      ROS:  Please see the history of present illness.   All other systems are personally reviewed and negative.    Exam:    Vital Signs:  BP 113/71   Pulse 72   Ht '5\' 9"'$  (1.753 m)   Wt 165 lb (74.8 kg)   BMI 24.37 kg/m     Well appearing, alert and conversant, regular work of breathing,  good skin color Eyes- anicteric, neuro- grossly intact, skin- no apparent rash or lesions or cyanosis, mouth- oral mucosa is pink   Labs/Other Tests and Data Reviewed:    Recent Labs: 03/01/2020: ALT 14; Hemoglobin 15.2; Platelets 196.0 12/04/2020: BUN 15; Creatinine, Ser 0.99; Potassium 4.2; Sodium 140   Wt Readings from Last 3 Encounters:  12/06/20 165 lb (74.8 kg)  11/26/20 168 lb (76.2 kg)  11/06/20 168 lb (76.2 kg)     Other studies personally reviewed: Additional studies/ records that were reviewed today include: (As above)   Review of the above records today demonstrates:   Prior radiographs: (As above) reviewed images with patient     ASSESSMENT & PLAN:    Atrial tach   Intermittent complete heart block   Trifascicular block     Pacemaker  St Jude  Cardiomyopathy, newly identified, presumably ischemic  Intercurrent identification of a cardiomyopathy, presumed ischemic based on a fixed perfusion defect off of his Myoview scan.  We discussed the potential causes of this including the possibility that there are multiple contributors including pacemaker cardiomyopathy as well as ischemia.  First step would be to elucidate coronary artery disease burden.  We have discussed catheterizations its benefits and risks including but not limited to stroke heart attack and death.  He understands these risks and is willing to proceed.  Following the information garnered above, we would then proceed with device generator revision either utilizing a surgically implanted LV lead or anticipating endovascular resynchronization with or without an ICD lead, and in this regard with his young age would  want to consider extraction of his RV pacing lead.  He is tolerating Entresto for his cardiomyopathy, we will continue this at 24/26.  With the concern of ischemic heart disease, we will begin on aspirin 81 as well as bisoprolol 2.5 daily, choosing this 1 given his high degree of selectivity  COVID 19 screen The patient denies symptoms of COVID 19 at this time.  The importance of social distancing was discussed today.  Follow-up:  45mTHV    Current medicines are reviewed at length with the patient today.   The patient does not have concerns regarding his medicines.  The following changes were made today:  (As above)   Labs/ tests ordered today include:  Cath  No orders of the defined types were placed in this encounter.   Future tests ( post COVID )   months  Patient Risk:  after full review of this patients clinical status, I feel that they are at moderate  risk at this time.  Today, I have spent 24 minutes with the patient with telehealth technology discussing the above.  Signed, SVirl Axe MD  12/06/2020 8:33 AM     [place] [phone]) [phone](fax)

## 2020-12-07 ENCOUNTER — Other Ambulatory Visit: Payer: Self-pay | Admitting: Internal Medicine

## 2020-12-10 NOTE — Telephone Encounter (Signed)
Message routed to Dr. Erin Fulling to advise-  Greetings Dr. Erin Fulling, I have been using the nebulizer for 6 weeks now and I still have a wet cough - and I still cough a lot. Is this normal? Is the wet cough ever going to go to a dry cough and decrease in frequency? I am planning a 5 day trip to [facility] next month, do I take the nebulizer with me and continue treatment or can I suspend treatment for those 5 days? Thank you and Best Regards, William Curry

## 2020-12-12 NOTE — Progress Notes (Signed)
Remote pacemaker transmission.   

## 2020-12-13 MED ORDER — PREDNISONE 20 MG PO TABS: 40.0000 mg | ORAL_TABLET | Freq: Every day | ORAL | 0 refills | Status: AC

## 2020-12-13 NOTE — Telephone Encounter (Signed)
Call made to patient, confirmed DOB. I asked what type of heart medications was he referring to. He recently started zebeta and entresto.   Medications entered on drug interaction checker. No interactions noted.   Made patient aware we will go ahead and send in prednisone.   Voiced understanding.

## 2020-12-14 ENCOUNTER — Other Ambulatory Visit: Payer: 59 | Admitting: *Deleted

## 2020-12-14 ENCOUNTER — Ambulatory Visit (INDEPENDENT_AMBULATORY_CARE_PROVIDER_SITE_OTHER): Payer: 59

## 2020-12-14 ENCOUNTER — Other Ambulatory Visit: Payer: Self-pay

## 2020-12-14 VITALS — BP 118/58 | HR 67 | Ht 69.0 in | Wt 167.0 lb

## 2020-12-14 DIAGNOSIS — I429 Cardiomyopathy, unspecified: Secondary | ICD-10-CM

## 2020-12-14 DIAGNOSIS — Z950 Presence of cardiac pacemaker: Secondary | ICD-10-CM

## 2020-12-14 DIAGNOSIS — I453 Trifascicular block: Secondary | ICD-10-CM

## 2020-12-14 DIAGNOSIS — Z01812 Encounter for preprocedural laboratory examination: Secondary | ICD-10-CM

## 2020-12-14 NOTE — Progress Notes (Signed)
Reason for visit: EKG  Name of MD requesting visit: Dr. Caryl Comes  H&P: Intercurrent identification of a cardiomyopathy, presumed ischemic based on a fixed perfusion defect off of his Myoview scan.    Assessment and plan per MD: Patient presents to clinic today for an EKG and labs for his cath scheduled 8/30. Patient is completely asymptomatic at this time. EKG reviewed with DOD Dr. Johnsie Cancel. Patient denies having any questions regarding his procedure. EKG to be scanned to the chart.

## 2020-12-15 LAB — BASIC METABOLIC PANEL
BUN/Creatinine Ratio: 16 (ref 10–24)
BUN: 16 mg/dL (ref 8–27)
CO2: 25 mmol/L (ref 20–29)
Calcium: 9.4 mg/dL (ref 8.6–10.2)
Chloride: 101 mmol/L (ref 96–106)
Creatinine, Ser: 1 mg/dL (ref 0.76–1.27)
Glucose: 91 mg/dL (ref 65–99)
Potassium: 4.6 mmol/L (ref 3.5–5.2)
Sodium: 139 mmol/L (ref 134–144)
eGFR: 81 mL/min/{1.73_m2} (ref >59)

## 2020-12-15 LAB — CBC
Hematocrit: 45.3 % (ref 37.5–51.0)
Hemoglobin: 15.4 g/dL (ref 13.0–17.7)
MCH: 31.7 pg (ref 26.6–33.0)
MCHC: 34 g/dL (ref 31.5–35.7)
MCV: 93 fL (ref 79–97)
Platelets: 228 10*3/uL (ref 150–450)
RBC: 4.86 x10E6/uL (ref 4.14–5.80)
RDW: 11.8 % (ref 11.6–15.4)
WBC: 7 10*3/uL (ref 3.4–10.8)

## 2020-12-17 ENCOUNTER — Telehealth: Payer: Self-pay | Admitting: *Deleted

## 2020-12-17 NOTE — H&P (Signed)
High PVC burden. EF <35% by echo and nuclear perfusion. Abnomal nuclear perfusion.

## 2020-12-17 NOTE — Telephone Encounter (Signed)
Cardiac catheterization scheduled at [facility] for: Tuesday December 18, 2020 7:30 [facility] Main Entrance [facility]) at: 5:30 AM   No solid food after midnight prior to cath, clear liquids until 5 AM day of procedure.  Morning medications can be taken pre-cath with sips of water including aspirin 81 mg.    Confirmed patient has responsible adult to drive home post procedure and be with patient first 24 hours after arriving home.  Patients are allowed one visitor in the waiting room during the time they are at the hospital for their procedure. Both patient and visitor must wear a mask once they enter the hospital.   Patient reports does not currently have any symptoms concerning for COVID-19 and no household members with COVID-19 like illness.      Reviewed procedure/mask/visitor instructions reviewed with patient.

## 2020-12-18 ENCOUNTER — Encounter (HOSPITAL_COMMUNITY)
Admission: RE | Disposition: A | Discharge status: Home / Self Care | Payer: Self-pay | Source: Home / Self Care | Attending: Interventional Cardiology

## 2020-12-18 ENCOUNTER — Ambulatory Visit (HOSPITAL_COMMUNITY)
Admission: RE | Admit: 2020-12-18 | Discharge: 2020-12-18 | Disposition: A | Discharge status: Home / Self Care | Payer: 59 | Attending: Interventional Cardiology | Admitting: Interventional Cardiology

## 2020-12-18 ENCOUNTER — Other Ambulatory Visit: Payer: Self-pay

## 2020-12-18 ENCOUNTER — Encounter (HOSPITAL_COMMUNITY): Payer: Self-pay | Admitting: Interventional Cardiology

## 2020-12-18 DIAGNOSIS — Z880 Allergy status to penicillin: Secondary | ICD-10-CM | POA: Insufficient documentation

## 2020-12-18 DIAGNOSIS — I471 Supraventricular tachycardia: Secondary | ICD-10-CM | POA: Diagnosis not present

## 2020-12-18 DIAGNOSIS — I255 Ischemic cardiomyopathy: Secondary | ICD-10-CM | POA: Insufficient documentation

## 2020-12-18 DIAGNOSIS — I4719 Other supraventricular tachycardia: Secondary | ICD-10-CM | POA: Diagnosis present

## 2020-12-18 DIAGNOSIS — Z950 Presence of cardiac pacemaker: Secondary | ICD-10-CM | POA: Diagnosis not present

## 2020-12-18 DIAGNOSIS — I5022 Chronic systolic (congestive) heart failure: Secondary | ICD-10-CM

## 2020-12-18 DIAGNOSIS — I442 Atrioventricular block, complete: Secondary | ICD-10-CM | POA: Diagnosis present

## 2020-12-18 DIAGNOSIS — I447 Left bundle-branch block, unspecified: Secondary | ICD-10-CM | POA: Diagnosis present

## 2020-12-18 DIAGNOSIS — R9439 Abnormal result of other cardiovascular function study: Secondary | ICD-10-CM | POA: Insufficient documentation

## 2020-12-18 DIAGNOSIS — I453 Trifascicular block: Secondary | ICD-10-CM | POA: Diagnosis present

## 2020-12-18 DIAGNOSIS — J849 Interstitial pulmonary disease, unspecified: Secondary | ICD-10-CM | POA: Diagnosis present

## 2020-12-18 DIAGNOSIS — Z79899 Other long term (current) drug therapy: Secondary | ICD-10-CM | POA: Diagnosis not present

## 2020-12-18 HISTORY — PX: LEFT HEART CATH AND CORONARY ANGIOGRAPHY: CATH118249

## 2020-12-18 SURGERY — LEFT HEART CATH AND CORONARY ANGIOGRAPHY
Anesthesia: LOCAL

## 2020-12-18 MED ORDER — SODIUM CHLORIDE 0.9 % IV SOLN: 250.0000 mL | INTRAVENOUS | Status: DC | PRN

## 2020-12-18 MED ORDER — SODIUM CHLORIDE 0.9% FLUSH: 3.0000 mL | INTRAVENOUS | Status: DC | PRN

## 2020-12-18 MED ORDER — ONDANSETRON HCL 4 MG/2ML IJ SOLN: 4.0000 mg | Freq: Four times a day (QID) | INTRAMUSCULAR | Status: DC | PRN

## 2020-12-18 MED ORDER — SODIUM CHLORIDE 0.9% FLUSH: 3.0000 mL | Freq: Two times a day (BID) | INTRAVENOUS | Status: DC

## 2020-12-18 MED ORDER — SODIUM CHLORIDE 0.9 % IV SOLN: INTRAVENOUS | Status: DC

## 2020-12-18 MED ORDER — VERAPAMIL HCL 2.5 MG/ML IV SOLN
INTRAVENOUS | Status: AC
  Filled 2020-12-18: qty 2

## 2020-12-18 MED ORDER — LIDOCAINE HCL (PF) 1 % IJ SOLN
INTRAMUSCULAR | Status: AC
  Filled 2020-12-18: qty 30

## 2020-12-18 MED ORDER — FENTANYL CITRATE (PF) 100 MCG/2ML IJ SOLN
INTRAMUSCULAR | Status: DC | PRN
  Administered 2020-12-18 (×2): 25 ug via INTRAVENOUS

## 2020-12-18 MED ORDER — IOHEXOL 350 MG/ML SOLN
INTRAVENOUS | Status: DC | PRN
  Administered 2020-12-18: 50 mL

## 2020-12-18 MED ORDER — HEPARIN SODIUM (PORCINE) 1000 UNIT/ML IJ SOLN
INTRAMUSCULAR | Status: AC
  Filled 2020-12-18: qty 1

## 2020-12-18 MED ORDER — ASPIRIN 81 MG PO CHEW: 81.0000 mg | CHEWABLE_TABLET | ORAL | Status: DC

## 2020-12-18 MED ORDER — HEPARIN (PORCINE) IN NACL 1000-0.9 UT/500ML-% IV SOLN
INTRAVENOUS | Status: DC | PRN
  Administered 2020-12-18 (×2): 500 mL

## 2020-12-18 MED ORDER — LABETALOL HCL 5 MG/ML IV SOLN: 10.0000 mg | INTRAVENOUS | Status: DC | PRN

## 2020-12-18 MED ORDER — HEPARIN SODIUM (PORCINE) 1000 UNIT/ML IJ SOLN
INTRAMUSCULAR | Status: DC | PRN
  Administered 2020-12-18: 4000 [IU] via INTRAVENOUS

## 2020-12-18 MED ORDER — VERAPAMIL HCL 2.5 MG/ML IV SOLN
INTRAVENOUS | Status: DC | PRN
  Administered 2020-12-18: 10 mL via INTRA_ARTERIAL

## 2020-12-18 MED ORDER — MIDAZOLAM HCL 2 MG/2ML IJ SOLN
INTRAMUSCULAR | Status: AC
  Filled 2020-12-18: qty 2

## 2020-12-18 MED ORDER — HYDRALAZINE HCL 20 MG/ML IJ SOLN: 10.0000 mg | INTRAMUSCULAR | Status: DC | PRN

## 2020-12-18 MED ORDER — LIDOCAINE HCL (PF) 1 % IJ SOLN
INTRAMUSCULAR | Status: DC | PRN
  Administered 2020-12-18: 2 mL

## 2020-12-18 MED ORDER — FENTANYL CITRATE (PF) 100 MCG/2ML IJ SOLN
INTRAMUSCULAR | Status: AC
  Filled 2020-12-18: qty 2

## 2020-12-18 MED ORDER — ACETAMINOPHEN 325 MG PO TABS: 650.0000 mg | ORAL_TABLET | ORAL | Status: DC | PRN

## 2020-12-18 MED ORDER — MIDAZOLAM HCL 2 MG/2ML IJ SOLN
INTRAMUSCULAR | Status: DC | PRN
  Administered 2020-12-18: 1 mg via INTRAVENOUS
  Administered 2020-12-18: 0.5 mg via INTRAVENOUS

## 2020-12-18 SURGICAL SUPPLY — 10 items
CATH 5FR JL3.5 JR4 ANG PIG MP (CATHETERS) ×2 IMPLANT
DEVICE RAD COMP TR BAND LRG (VASCULAR PRODUCTS) ×2 IMPLANT
GLIDESHEATH SLEND A-KIT 6F 22G (SHEATH) ×2 IMPLANT
GUIDEWIRE INQWIRE 1.5J.035X260 (WIRE) ×1 IMPLANT
INQWIRE 1.5J .035X260CM (WIRE) ×2
KIT HEART LEFT (KITS) ×2 IMPLANT
PACK CARDIAC CATHETERIZATION (CUSTOM PROCEDURE TRAY) ×2 IMPLANT
SHEATH PROBE COVER 6X72 (BAG) ×2 IMPLANT
TRANSDUCER W/STOPCOCK (MISCELLANEOUS) ×2 IMPLANT
TUBING CIL FLEX 10 FLL-RA (TUBING) ×2 IMPLANT

## 2020-12-18 NOTE — Op Note (Signed)
No fluoroscopic coronary calcification. Right dominant coronary anatomy Normal coronary arteries.   LVEDP 5 mmHg.  Contrast ventriculogram was not performed.

## 2020-12-18 NOTE — Interval H&P Note (Signed)
Cath Lab Visit (complete for each Cath Lab visit)  Clinical Evaluation Leading to the Procedure:   ACS: No.  Non-ACS:    Anginal Classification: No Symptoms  Anti-ischemic medical therapy: No Therapy  Non-Invasive Test Results: High-risk stress test findings: cardiac mortality >3%/year  Prior CABG: No previous CABG      History and Physical Interval Note:  12/18/2020 8:21 AM  [name]  has presented today for surgery, with the diagnosis of abnormal stress test.  The various methods of treatment have been discussed with the patient and family. After consideration of risks, benefits and other options for treatment, the patient has consented to  Procedure(s): LEFT HEART CATH AND CORONARY ANGIOGRAPHY (N/A) as a surgical intervention.  The patient's history has been reviewed, patient examined, no change in status, stable for surgery.  I have reviewed the patient's chart and labs.  Questions were answered to the patient's satisfaction.     William Curry

## 2020-12-19 ENCOUNTER — Other Ambulatory Visit: Payer: Self-pay | Admitting: Pulmonary Disease

## 2020-12-20 ENCOUNTER — Ambulatory Visit (INDEPENDENT_AMBULATORY_CARE_PROVIDER_SITE_OTHER): Payer: 59

## 2020-12-20 DIAGNOSIS — I429 Cardiomyopathy, unspecified: Secondary | ICD-10-CM

## 2020-12-25 LAB — CUP PACEART REMOTE DEVICE CHECK
Battery Remaining Longevity: 0 mo
Battery Voltage: 2.59 V
Brady Statistic AP VP Percent: 8.2 %
Brady Statistic AP VS Percent: 1 %
Brady Statistic AS VP Percent: 92 %
Brady Statistic AS VS Percent: 1 %
Brady Statistic RA Percent Paced: 8.2 %
Brady Statistic RV Percent Paced: 99 %
Date Time Interrogation Session: 20220901021812
Implantable Lead Implant Date: 20130115
Implantable Lead Implant Date: 20130115
Implantable Lead Location: 753859
Implantable Lead Location: 753860
Implantable Pulse Generator Implant Date: 20130115
Lead Channel Impedance Value: 350 Ohm
Lead Channel Impedance Value: 400 Ohm
Lead Channel Pacing Threshold Amplitude: 0.375 V
Lead Channel Pacing Threshold Amplitude: 1.5 V
Lead Channel Pacing Threshold Pulse Width: 0.4 ms
Lead Channel Pacing Threshold Pulse Width: 0.4 ms
Lead Channel Sensing Intrinsic Amplitude: 3.8 mV
Lead Channel Sensing Intrinsic Amplitude: 5.1 mV
Lead Channel Setting Pacing Amplitude: 1.375
Lead Channel Setting Pacing Amplitude: 1.75 V
Lead Channel Setting Pacing Pulse Width: 0.4 ms
Lead Channel Setting Sensing Sensitivity: 0.5 mV
Pulse Gen Model: 2210
Pulse Gen Serial Number: 7304172

## 2021-01-01 NOTE — Progress Notes (Signed)
Remote pacemaker transmission.   

## 2021-01-01 NOTE — Addendum Note (Signed)
Addended by: Cheri Kearns A on: 01/01/2021 02:36 PM   Modules accepted: Level of Service

## 2021-01-09 NOTE — H&P (View-Only) (Signed)
Electrophysiology Office Note Date: 01/10/2021  ID:  William, Curry Nov 16, 1950, MRN [id number]  PCP: Darreld Mclean, MD Primary Cardiologist: None Electrophysiologist: Virl Axe, MD   CC: Pacemaker follow-up  William Curry is a 70 y.o. male seen today for Virl Axe, MD for routine electrophysiology followup.    He is scheduled for gen change but will plan to change to CRT-P vs CRT-D upgrade 01/14/2021 based on discussion today.   Echo 11/06/20 LVEF reduced 30-35%. Concern for CAD  Myoview 11/26/2020 EF 36%, "There is a large, fixed defect of moderate severity present in the basal inferoseptal, basal inferior, mid inferoseptal, mid inferior, apical inferior and apex location. Findings consistent with prior infarct. No peri-infarct ischemia. Intermediate risk study."  LHC 12/18/20 with NO CAD. False positive Myoview  He has been started on Entresto and bisoprolol  Since last being seen in our clinic the patient reports doing well overall. He has chronic intermittent SOB with moderate exertion. Followed by [facility] for ILD. Denies chest pain or peripheral edema.  He has had a gradual increase in pacing over the past 4-5 years and now > 90%.   Device History: St. 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 Right Knee   Tinea versicolor    Past Surgical History:  Procedure Laterality Date   CARTILAGE REPAIR  Nov.-1989   Left knee-HARD TO WAKE UP AFTER SX   COLONOSCOPY     KNEE ARTHROSCOPY WITH ANTERIOR CRUCIATE LIGAMENT (ACL) REPAIR  [phone]   Left knee-HARD  TO WAKE UP AFTER SX   LEFT HEART CATH AND CORONARY ANGIOGRAPHY N/A 12/18/2020   Procedure: LEFT HEART CATH AND CORONARY ANGIOGRAPHY;  Surgeon: Belva Crome, MD;  Location: [facility];  Service: Cardiovascular;  Laterality: N/A;   LITHOTRIPSY  Jun-2010   Kidney stone (1st stone 1994 - Passed)   PACEMAKER INSTALLATION  Jan-2013   Complete Heart Block   SKIN BIOPSY     RIght knee for skin cancer    Current Outpatient Medications  Medication Sig Dispense Refill   aspirin EC 81 MG tablet Take 1 tablet (81 mg total) by mouth daily. 30 tablet 11   bisoprolol (ZEBETA) 5 MG tablet Take 0.5 tablets (2.5 mg total) by mouth daily. 45 tablet 3   COVID-19 mRNA vaccine, Moderna, 100 MCG/0.5ML injection Inject into the muscle. 0.25 mL 0   fluticasone (FLONASE) 50 MCG/ACT nasal spray SPRAY 1 SPRAY INTO BOTH NOSTRILS DAILY. 48 mL 2   ipratropium-albuterol (DUONEB) 0.5-2.5 (3) MG/3ML SOLN TAKE 3 MLS BY NEBULIZATION IN THE MORNING AND AT BEDTIME. 180 mL 1   Multiple Vitamin (MULTIVITAMIN) tablet Take 1 tablet by mouth daily. [facility]/ one a day men complete 50 +     Multiple Vitamins-Minerals (PRESERVISION AREDS 2+MULTI VIT PO) Take 1 tablet 2 (two) times daily by mouth.     OVER THE COUNTER MEDICATION Take 65 mg by mouth daily. CBD 1 dropper full     pantoprazole (PROTONIX) 20 MG tablet TAKE 1  TABLET BY MOUTH EVERY DAY 30 tablet 2   sacubitril-valsartan (ENTRESTO) 24-26 MG Take 1 tablet by mouth 2 (two) times daily. 180 tablet 3   sildenafil (REVATIO) 20 MG tablet TAKE 3 TO 5 TABLETS BY MOUTH ONCE DAILY AS NEEDED 50 tablet 2   terbinafine (LAMISIL) 1 % cream Apply 1 application topically daily as needed (Jock itch).     valACYclovir (VALTREX) 500 MG tablet Take 500 mg by mouth as needed (Cold sores).     No current facility-administered medications for this visit.    Allergies:   Penicillins   Social History: Social History   Socioeconomic History   Marital status: Married    Spouse  name: Not on file   Number of children: Not on file   Years of education: Not on file   Highest education level: Not on file  Occupational History   Occupation: Insurance underwriter / Accountant  Tobacco Use   Smoking status: Never   Smokeless tobacco: Former    Types: Chew   Tobacco comments:    admits do doing chewing tabacco. 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   To [place] 2013   Remarried 2007   [employer] [place] '71-'75   Social Determinants of Health   Financial Resource Strain: Not on file  Food Insecurity: Not on file  Transportation Needs: Not on file  Physical Activity: Not on file  Stress: Not on file  Social Connections: Not on file  Intimate Partner Violence: Not on file    Family History: Family History  Problem Relation Age of Onset   Cancer Mother 42       Breast   Diabetes Mother 19       on insulin   Breast cancer Mother    Diabetes Father 44       on insulin   Bladder Cancer Father    Colon polyps Sister    Colon cancer Maternal Grandfather    Esophageal cancer Neg Hx    Rectal cancer Neg Hx    Stomach cancer Neg Hx    Prostate cancer Neg Hx      Review of Systems: All other systems reviewed and are otherwise negative except as noted above.  Physical Exam: Vitals:   01/10/21 0955  BP: 124/78  Pulse: 66  SpO2: 96%  Weight: 169 lb (76.7 kg)  Height: 5\' 9"  (1.753 m)     GEN- The patient is well appearing, alert and oriented x 3 today.   HEENT: normocephalic, atraumatic; sclera clear, conjunctiva pink; hearing intact; oropharynx clear; neck supple  Lungs- Clear to ausculation bilaterally, normal  work of breathing.  No wheezes, rales, rhonchi Heart- Regular rate and rhythm, no murmurs, rubs or gallops  GI- soft, non-tender, non-distended, bowel sounds present  Extremities- no clubbing or cyanosis. No edema MS- no significant deformity or atrophy Skin- warm and dry, no rash or lesion; PPM pocket well healed Psych- euthymic mood, full affect Neuro- strength and sensation are intact  PPM Interrogation- Not  interrogated today. Recent checks and remotes reviewed.    EKG:  EKG is not ordered today.  Recent Labs: 03/01/2020: ALT 14 12/14/2020: BUN 16; Creatinine, Ser 1.00; Hemoglobin 15.4; Platelets 228; Potassium 4.6; Sodium 139   Wt Readings from Last 3 Encounters:  01/10/21 169 lb (76.7 kg)  12/18/20 165 lb (74.8 kg)  12/14/20 167 lb (75.8 kg)     Other studies Reviewed: Additional studies/ records that were reviewed today include: Previous EP office notes, Previous remote checks, Most recent labwork.   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   False positive perfusion imaging.  Recommendations -> PVC suppression and resynchronization to restore LV systolic function.  We discussed patient Myoview and Cath results at length. We discussed the indication for GDMT and the indications for CRT.  We also discussed the indications and use of HV therapies.  In shared decision making process, we will plan to proceed with CRT PACER Upgrade.  I had previously discussed with Dr. Caryl Comes who also agreed with this decision. This in the setting of mortality and morbidity data with CRT.    Explained risks, benefits, and alternatives to BIV upgrade implantation, including but not limited to bleeding, infection, pneumothorax, pericardial effusion, lead dislodgement, heart attack,  stroke, or death.  Pt verbalized understanding and agrees to proceed.   Current medicines are reviewed at length with the patient today.    Labs/ tests ordered today include:  Orders Placed This Encounter  Procedures   Basic metabolic panel   CBC   Disposition:   Follow up with Dr. Caryl Comes as usual post BiV upgrade   Signed, Shirley Friar, PA-C  01/10/2021 10:06 AM  [facility] [address] [place] [address] [phone] (office) [phone] (fax)

## 2021-01-09 NOTE — Progress Notes (Signed)
Electrophysiology Office Note Date: 01/10/2021  ID:  William Curry, William Curry 03/26/1951, MRN [id number]  PCP: William Mclean, MD Primary Cardiologist: None Electrophysiologist: William Axe, MD   CC: Pacemaker follow-up  William Curry is a 70 y.o. male seen today for William Axe, MD for routine electrophysiology followup.    He is scheduled for gen change but will plan to change to CRT-P vs CRT-D upgrade 01/14/2021 based on discussion today.   Echo 11/06/20 LVEF reduced 30-35%. Concern for CAD  Myoview 11/26/2020 EF 36%, "There is a large, fixed defect of moderate severity present in the basal inferoseptal, basal inferior, mid inferoseptal, mid inferior, apical inferior and apex location. Findings consistent with prior infarct. No peri-infarct ischemia. Intermediate risk study."  LHC 12/18/20 with NO CAD. False positive Myoview  He has been started on Entresto and bisoprolol  Since last being seen in our clinic the patient reports doing well overall. He has chronic intermittent SOB with moderate exertion. Followed by [facility] for ILD. Denies chest pain or peripheral edema.  He has had a gradual increase in pacing over the past 4-5 years and now > 90%.   Device History: St. Jude Dual Chamber PPM implanted 2013 for CHB  Past Medical History:  Diagnosis Date   DDD (degenerative disc disease), lumbar    GERD (gastroesophageal reflux disease)    Heart block AV complete ([place] Curry) 2013   Pacemaker   History of chicken pox    Hypertension 2002-2005   Kidney stone 1994-2010   Passed stone in 1994, Lithotripsy in 2010, stones passed in 2018   Macular degeneration    Scoliosis    Skin cancer    Squamous cell carcomina. Right Knee   Tinea versicolor    Past Surgical History:  Procedure Laterality Date   CARTILAGE REPAIR  Nov.-1989   Left knee-HARD TO WAKE UP AFTER SX   COLONOSCOPY     KNEE ARTHROSCOPY WITH ANTERIOR CRUCIATE LIGAMENT (ACL) REPAIR  [phone]   Left knee-HARD  TO WAKE UP AFTER SX   LEFT HEART CATH AND CORONARY ANGIOGRAPHY N/A 12/18/2020   Procedure: LEFT HEART CATH AND CORONARY ANGIOGRAPHY;  Surgeon: Belva Crome, MD;  Location: [facility];  Service: Cardiovascular;  Laterality: N/A;   LITHOTRIPSY  Jun-2010   Kidney stone (1st stone 1994 - Passed)   PACEMAKER INSTALLATION  Jan-2013   Complete Heart Block   SKIN BIOPSY     RIght knee for skin cancer    Current Outpatient Medications  Medication Sig Dispense Refill   aspirin EC 81 MG tablet Take 1 tablet (81 mg total) by mouth daily. 30 tablet 11   bisoprolol (ZEBETA) 5 MG tablet Take 0.5 tablets (2.5 mg total) by mouth daily. 45 tablet 3   COVID-19 mRNA vaccine, Moderna, 100 MCG/0.5ML injection Inject into the muscle. 0.25 mL 0   fluticasone (FLONASE) 50 MCG/ACT nasal spray SPRAY 1 SPRAY INTO BOTH NOSTRILS DAILY. 48 mL 2   ipratropium-albuterol (DUONEB) 0.5-2.5 (3) MG/3ML SOLN TAKE 3 MLS BY NEBULIZATION IN THE MORNING AND AT BEDTIME. 180 mL 1   Multiple Vitamin (MULTIVITAMIN) tablet Take 1 tablet by mouth daily. [facility]/ one a day men complete 50 +     Multiple Vitamins-Minerals (PRESERVISION AREDS 2+MULTI VIT PO) Take 1 tablet 2 (two) times daily by mouth.     OVER THE COUNTER MEDICATION Take 65 mg by mouth daily. CBD 1 dropper full     pantoprazole (PROTONIX) 20 MG tablet TAKE 1  TABLET BY MOUTH EVERY DAY 30 tablet 2   sacubitril-valsartan (ENTRESTO) 24-26 MG Take 1 tablet by mouth 2 (two) times daily. 180 tablet 3   sildenafil (REVATIO) 20 MG tablet TAKE 3 TO 5 TABLETS BY MOUTH ONCE DAILY AS NEEDED 50 tablet 2   terbinafine (LAMISIL) 1 % cream Apply 1 application topically daily as needed (Jock itch).     valACYclovir (VALTREX) 500 MG tablet Take 500 mg by mouth as needed (Cold sores).     No current facility-administered medications for this visit.    Allergies:   Penicillins   Social History: Social History   Socioeconomic History   Marital status: Married    Spouse  name: Not on file   Number of children: Not on file   Years of education: Not on file   Highest education level: Not on file  Occupational History   Occupation: Insurance underwriter / Accountant  Tobacco Use   Smoking status: Never   Smokeless tobacco: Former    Types: Chew   Tobacco comments:    admits do doing chewing tabacco. 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   To  2013   Remarried 2007   [employer] '71-'75   Social Determinants of Health   Financial Resource Strain: Not on file  Food Insecurity: Not on file  Transportation Needs: Not on file  Physical Activity: Not on file  Stress: Not on file  Social Connections: Not on file  Intimate Partner Violence: Not on file    Family History: Family History  Problem Relation Age of Onset   Cancer Mother 8       Breast   Diabetes Mother 39       on insulin   Breast cancer Mother    Diabetes Father 34       on insulin   Bladder Cancer Father    Colon polyps Sister    Colon cancer Maternal Grandfather    Esophageal cancer Neg Hx    Rectal cancer Neg Hx    Stomach cancer Neg Hx    Prostate cancer Neg Hx      Review of Systems: All other systems reviewed and are otherwise negative except as noted above.  Physical Exam: Vitals:   01/10/21 0955  BP: 124/78  Pulse: 66  SpO2: 96%  Weight: 169 lb (76.7 kg)  Height: 5\' 9"  (1.753 m)     GEN- The patient is well appearing, alert and oriented x 3 today.   HEENT: normocephalic, atraumatic; sclera clear, conjunctiva pink; hearing intact; oropharynx clear; neck supple  Lungs- Clear to ausculation bilaterally, normal  work of breathing.  No wheezes, rales, rhonchi Heart- Regular rate and rhythm, no murmurs, rubs or gallops  GI- soft, non-tender, non-distended, bowel sounds present  Extremities- no clubbing or cyanosis. No edema MS- no significant deformity or atrophy Skin- warm and dry, no rash or lesion; PPM pocket well healed Psych- euthymic mood, full affect Neuro- strength and sensation are intact  PPM Interrogation- Not  interrogated today. Recent checks and remotes reviewed.    EKG:  EKG is not ordered today.  Recent Labs: 03/01/2020: ALT 14 12/14/2020: BUN 16; Creatinine, Ser 1.00; Hemoglobin 15.4; Platelets 228; Potassium 4.6; Sodium 139   Wt Readings from Last 3 Encounters:  01/10/21 169 lb (76.7 kg)  12/18/20 165 lb (74.8 kg)  12/14/20 167 lb (75.8 kg)     Other studies Reviewed: Additional studies/ records that were reviewed today include: Previous EP office notes, Previous remote checks, Most recent labwork.   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   False positive perfusion imaging.  Recommendations -> PVC suppression and resynchronization to restore LV systolic function.  We discussed patient Myoview and Cath results at length. We discussed the indication for GDMT and the indications for CRT.  We also discussed the indications and use of HV therapies.  In shared decision making process, we will plan to proceed with CRT PACER Upgrade.  I had previously discussed with Dr. Caryl Comes who also agreed with this decision. This in the setting of mortality and morbidity data with CRT.    Explained risks, benefits, and alternatives to BIV upgrade implantation, including but not limited to bleeding, infection, pneumothorax, pericardial effusion, lead dislodgement, heart attack,  stroke, or death.  Pt verbalized understanding and agrees to proceed.   Current medicines are reviewed at length with the patient today.    Labs/ tests ordered today include:  Orders Placed This Encounter  Procedures   Basic metabolic panel   CBC   Disposition:   Follow up with Dr. Caryl Comes as usual post BiV upgrade   Signed, Shirley Friar, PA-C  01/10/2021 10:06 AM  [facility] [address] [place] [address] [phone] (office) [phone] (fax)

## 2021-01-10 ENCOUNTER — Other Ambulatory Visit: Payer: Self-pay

## 2021-01-10 ENCOUNTER — Encounter: Payer: Self-pay | Admitting: Student

## 2021-01-10 ENCOUNTER — Ambulatory Visit (INDEPENDENT_AMBULATORY_CARE_PROVIDER_SITE_OTHER): Payer: 59 | Admitting: Student

## 2021-01-10 VITALS — BP 124/78 | HR 66 | Ht 69.0 in | Wt 169.0 lb

## 2021-01-10 DIAGNOSIS — I471 Supraventricular tachycardia: Secondary | ICD-10-CM

## 2021-01-10 DIAGNOSIS — I4719 Other supraventricular tachycardia: Secondary | ICD-10-CM

## 2021-01-10 DIAGNOSIS — I429 Cardiomyopathy, unspecified: Secondary | ICD-10-CM

## 2021-01-10 LAB — BASIC METABOLIC PANEL
BUN/Creatinine Ratio: 23 (ref 10–24)
BUN: 21 mg/dL (ref 8–27)
CO2: 29 mmol/L (ref 20–29)
Calcium: 9.2 mg/dL (ref 8.6–10.2)
Chloride: 102 mmol/L (ref 96–106)
Creatinine, Ser: 0.91 mg/dL (ref 0.76–1.27)
Glucose: 98 mg/dL (ref 65–99)
Potassium: 4.7 mmol/L (ref 3.5–5.2)
Sodium: 139 mmol/L (ref 134–144)
eGFR: 91 mL/min/{1.73_m2} (ref >59)

## 2021-01-10 LAB — CBC
Hematocrit: 44.5 % (ref 37.5–51.0)
Hemoglobin: 15.3 g/dL (ref 13.0–17.7)
MCH: 32.1 pg (ref 26.6–33.0)
MCHC: 34.4 g/dL (ref 31.5–35.7)
MCV: 94 fL (ref 79–97)
Platelets: 193 10*3/uL (ref 150–450)
RBC: 4.76 x10E6/uL (ref 4.14–5.80)
RDW: 13.1 % (ref 11.6–15.4)
WBC: 7.2 10*3/uL (ref 3.4–10.8)

## 2021-01-10 NOTE — Patient Instructions (Signed)
Medication Instructions:  °Your physician recommends that you continue on your current medications as directed. Please refer to the Current Medication list given to you today. ° °*If you need a refill on your cardiac medications before your next appointment, please call your pharmacy* ° ° °Lab Work: °TODAY: BMET, CBC ° °If you have labs (blood work) drawn today and your tests are completely normal, you will receive your results only by: °MyChart Message (if you have MyChart) OR °A paper copy in the mail °If you have any lab test that is abnormal or we need to change your treatment, we will call you to review the results. ° ° °Follow-Up: °At [facility], you and your health needs are our priority.  As part of our continuing mission to provide you with exceptional heart care, we have created designated Provider Care Teams.  These Care Teams include your primary Cardiologist (physician) and Advanced Practice Providers (APPs -  Physician Assistants and Nurse Practitioners) who all work together to provide you with the care you need, when you need it. ° °Your next appointment:   °As scheduled °

## 2021-01-11 ENCOUNTER — Other Ambulatory Visit: Payer: 59

## 2021-01-14 ENCOUNTER — Other Ambulatory Visit: Payer: Self-pay | Admitting: Physician Assistant

## 2021-01-14 ENCOUNTER — Ambulatory Visit (HOSPITAL_COMMUNITY)
Admission: RE | Disposition: A | Discharge status: Home / Self Care | Payer: 59 | Source: Home / Self Care | Attending: Internal Medicine

## 2021-01-14 ENCOUNTER — Ambulatory Visit (HOSPITAL_COMMUNITY)
Admission: RE | Admit: 2021-01-14 | Discharge: 2021-01-14 | Disposition: A | Discharge status: Home / Self Care | Payer: 59 | Attending: Internal Medicine | Admitting: Internal Medicine

## 2021-01-14 ENCOUNTER — Ambulatory Visit (HOSPITAL_COMMUNITY): Payer: 59

## 2021-01-14 ENCOUNTER — Other Ambulatory Visit: Payer: Self-pay | Admitting: Pulmonary Disease

## 2021-01-14 ENCOUNTER — Other Ambulatory Visit: Payer: Self-pay

## 2021-01-14 ENCOUNTER — Encounter (HOSPITAL_COMMUNITY): Payer: Self-pay | Admitting: Internal Medicine

## 2021-01-14 DIAGNOSIS — Z880 Allergy status to penicillin: Secondary | ICD-10-CM | POA: Diagnosis not present

## 2021-01-14 DIAGNOSIS — I442 Atrioventricular block, complete: Secondary | ICD-10-CM | POA: Insufficient documentation

## 2021-01-14 DIAGNOSIS — I453 Trifascicular block: Secondary | ICD-10-CM | POA: Diagnosis not present

## 2021-01-14 DIAGNOSIS — Z7982 Long term (current) use of aspirin: Secondary | ICD-10-CM | POA: Insufficient documentation

## 2021-01-14 DIAGNOSIS — Z87891 Personal history of nicotine dependence: Secondary | ICD-10-CM | POA: Insufficient documentation

## 2021-01-14 DIAGNOSIS — Z8619 Personal history of other infectious and parasitic diseases: Secondary | ICD-10-CM | POA: Insufficient documentation

## 2021-01-14 DIAGNOSIS — Z85828 Personal history of other malignant neoplasm of skin: Secondary | ICD-10-CM | POA: Diagnosis not present

## 2021-01-14 DIAGNOSIS — I10 Essential (primary) hypertension: Secondary | ICD-10-CM | POA: Insufficient documentation

## 2021-01-14 DIAGNOSIS — Z45010 Encounter for checking and testing of cardiac pacemaker pulse generator [battery]: Secondary | ICD-10-CM | POA: Diagnosis present

## 2021-01-14 DIAGNOSIS — Z79899 Other long term (current) drug therapy: Secondary | ICD-10-CM

## 2021-01-14 DIAGNOSIS — Z8052 Family history of malignant neoplasm of bladder: Secondary | ICD-10-CM | POA: Diagnosis not present

## 2021-01-14 DIAGNOSIS — I429 Cardiomyopathy, unspecified: Secondary | ICD-10-CM | POA: Insufficient documentation

## 2021-01-14 DIAGNOSIS — Z800 Family history of malignant neoplasm of digestive organs: Secondary | ICD-10-CM | POA: Insufficient documentation

## 2021-01-14 DIAGNOSIS — Z803 Family history of malignant neoplasm of breast: Secondary | ICD-10-CM | POA: Insufficient documentation

## 2021-01-14 DIAGNOSIS — Z959 Presence of cardiac and vascular implant and graft, unspecified: Secondary | ICD-10-CM

## 2021-01-14 HISTORY — PX: BIV UPGRADE: EP1202

## 2021-01-14 SURGERY — BIV UPGRADE

## 2021-01-14 MED ORDER — SODIUM CHLORIDE 0.9 % IV SOLN: INTRAVENOUS | Status: DC

## 2021-01-14 MED ORDER — HEPARIN (PORCINE) IN NACL 1000-0.9 UT/500ML-% IV SOLN
INTRAVENOUS | Status: DC | PRN
  Administered 2021-01-14: 500 mL

## 2021-01-14 MED ORDER — LIDOCAINE HCL (PF) 1 % IJ SOLN
INTRAMUSCULAR | Status: DC | PRN
  Administered 2021-01-14: 50 mL

## 2021-01-14 MED ORDER — ONDANSETRON HCL 4 MG/2ML IJ SOLN
4.0000 mg | Freq: Four times a day (QID) | INTRAMUSCULAR | Status: DC | PRN
Start: 2021-01-14 — End: 2021-01-14

## 2021-01-14 MED ORDER — CHLORHEXIDINE GLUCONATE 4 % EX LIQD
4.0000 "application " | Freq: Once | CUTANEOUS | Status: DC
  Filled 2021-01-14: qty 60

## 2021-01-14 MED ORDER — SODIUM CHLORIDE 0.9 % IV SOLN
80.0000 mg | INTRAVENOUS | Status: AC
  Administered 2021-01-14: 80 mg
  Filled 2021-01-14: qty 2

## 2021-01-14 MED ORDER — ACETAMINOPHEN 325 MG PO TABS: 325.0000 mg | ORAL_TABLET | ORAL | Status: DC | PRN

## 2021-01-14 MED ORDER — FENTANYL CITRATE (PF) 100 MCG/2ML IJ SOLN
INTRAMUSCULAR | Status: DC | PRN
  Administered 2021-01-14 (×4): 25 ug via INTRAVENOUS

## 2021-01-14 MED ORDER — FENTANYL CITRATE (PF) 100 MCG/2ML IJ SOLN
INTRAMUSCULAR | Status: AC
  Filled 2021-01-14: qty 2

## 2021-01-14 MED ORDER — VANCOMYCIN HCL IN DEXTROSE 1-5 GM/200ML-% IV SOLN
1000.0000 mg | INTRAVENOUS | Status: AC
  Administered 2021-01-14: 1000 mg via INTRAVENOUS
  Filled 2021-01-14: qty 200

## 2021-01-14 MED ORDER — SODIUM CHLORIDE 0.9 % IV SOLN
INTRAVENOUS | Status: DC
Start: 2021-01-14 — End: 2021-01-14

## 2021-01-14 MED ORDER — MIDAZOLAM HCL 5 MG/5ML IJ SOLN
INTRAMUSCULAR | Status: AC
  Filled 2021-01-14: qty 5

## 2021-01-14 MED ORDER — IOHEXOL 350 MG/ML SOLN
INTRAVENOUS | Status: DC | PRN
  Administered 2021-01-14: 5 mL via INTRAVENOUS
  Administered 2021-01-14: 15 mL via INTRAVENOUS

## 2021-01-14 MED ORDER — SODIUM CHLORIDE 0.9 % IV SOLN
INTRAVENOUS | Status: AC
  Filled 2021-01-14: qty 2

## 2021-01-14 MED ORDER — SPIRONOLACTONE 25 MG PO TABS: 12.5000 mg | ORAL_TABLET | Freq: Every day | ORAL | 6 refills | Status: DC

## 2021-01-14 MED ORDER — MIDAZOLAM HCL 5 MG/5ML IJ SOLN
INTRAMUSCULAR | Status: DC | PRN
  Administered 2021-01-14: 1 mg via INTRAVENOUS
  Administered 2021-01-14: 2 mg via INTRAVENOUS
  Administered 2021-01-14 (×2): 1 mg via INTRAVENOUS

## 2021-01-14 MED ORDER — CEFAZOLIN SODIUM-DEXTROSE 2-4 GM/100ML-% IV SOLN
INTRAVENOUS | Status: AC
  Filled 2021-01-14: qty 100

## 2021-01-14 MED ORDER — LIDOCAINE HCL 1 % IJ SOLN
INTRAMUSCULAR | Status: AC
  Filled 2021-01-14: qty 60

## 2021-01-14 SURGICAL SUPPLY — 14 items
CABLE SURGICAL S-101-97-12 (CABLE) ×2 IMPLANT
CATH CPS DIRECT 135 DS2C020 (CATHETERS) ×1 IMPLANT
GUIDEWIRE SAFE TJ AMPLATZ EXST (WIRE) ×1 IMPLANT
HEMOSTAT SURGICEL 2X4 FIBR (HEMOSTASIS) ×1 IMPLANT
KIT MICROPUNCTURE NIT STIFF (SHEATH) ×1 IMPLANT
LEAD QUARTET 1456Q-86 (Lead) IMPLANT
PACEMAKER QUDR ALLR CRT PM3562 (Pacemaker) IMPLANT
PAD PRO RADIOLUCENT 2001M-C (PAD) ×2 IMPLANT
PMKR QUADRA ALLURE CRT PM3562 (Pacemaker) ×2 IMPLANT
QUARTET 1456Q-86 (Lead) ×2 IMPLANT
SHEATH 9.5FR PRELUDE SNAP 13 (SHEATH) ×1 IMPLANT
TRAY PACEMAKER INSERTION (PACKS) ×2 IMPLANT
WIRE ACUITY WHISPER EDS 4648 (WIRE) ×2 IMPLANT
WIRE HI TORQ VERSACORE-J 145CM (WIRE) ×1 IMPLANT

## 2021-01-14 NOTE — Progress Notes (Signed)
Patient and wife was given discharge instructions. Both verbalized understanding. 

## 2021-01-14 NOTE — Interval H&P Note (Signed)
History and Physical Interval Note:  01/14/2021 7:34 AM  William Curry  has presented today for surgery, with the diagnosis of ERI.  The various methods of treatment have been discussed with the patient and family. After consideration of risks, benefits and other options for treatment, the patient has consented to  Procedure(s): BIV PPM UPGRADE (N/A) as a surgical intervention.  The patient's history has been reviewed, patient examined, no change in status, stable for surgery.  I have reviewed the patient's chart and labs.  Questions were answered to the patient's satisfaction.     Virl Axe  Reviewed risks and benefits-- will plan to proceed with CRT-P and if unable to place an LV lead will place LBBB delay

## 2021-01-14 NOTE — Discharge Instructions (Signed)
    Supplemental Discharge Instructions for  Pacemaker/Defibrillator Patients  Tomorrow, 01/15/21, send in a device transmission  Activity No heavy lifting or vigorous activity with your left/right arm for 6 to 8 weeks.  Do not raise your left/right arm above your head for one week.  Gradually raise your affected arm as drawn below.              01/19/21                    01/20/21                   01/21/21                    01/22/21 __  NO DRIVING for   1 week  ; you may begin driving on  18/5/63   .  WOUND CARE Keep the wound area clean and dry.  Do not get this area wet , no showers for 24 hours; you may shower on  01/15/21 evening   . Tomorrow, 01/15/21, remove the arm sling Dr. Caryl Comes used DERMABOND to close your wound.  DO NOT peel this off.  Do not rub/scrub the area, pat dry. No bandage is needed on the site.  DO  NOT apply any creams, oils, or ointments to the wound area. If you notice any drainage or discharge from the wound, any swelling or bruising at the site, or you develop a fever > 101? F after you are discharged home, call the office at once.  Special Instructions You are still able to use cellular telephones; use the ear opposite the side where you have your pacemaker/defibrillator.  Avoid carrying your cellular phone near your device. When traveling through airports, show security personnel your identification card to avoid being screened in the metal detectors.  Ask the security personnel to use the hand wand. Avoid arc welding equipment, MRI testing (magnetic resonance imaging), TENS units (transcutaneous nerve stimulators).  Call the office for questions about other devices. Avoid electrical appliances that are in poor condition or are not properly grounded. Microwave ovens are safe to be near or to operate.

## 2021-01-14 NOTE — Interval H&P Note (Signed)
History and Physical Interval Note:  01/14/2021 7:44 AM  William Curry  has presented today for surgery, with the diagnosis of ERI.  The various methods of treatment have been discussed with the patient and family. After consideration of risks, benefits and other options for treatment, the patient has consented to  Procedure(s): BIV PPM UPGRADE (N/A) as a surgical intervention.  The patient's history has been reviewed, patient examined, no change in status, stable for surgery.  I have reviewed the patient's chart and labs.  Questions were answered to the patient's satisfaction.     Virl Axe

## 2021-01-15 ENCOUNTER — Ambulatory Visit: Payer: 59 | Admitting: Pulmonary Disease

## 2021-01-15 ENCOUNTER — Telehealth: Payer: Self-pay

## 2021-01-15 MED FILL — Cefazolin Sodium-Dextrose IV Solution 2 GM/100ML-4%: INTRAVENOUS | Qty: 100 | Status: AC

## 2021-01-15 MED FILL — Lidocaine HCl Local Inj 1%: INTRAMUSCULAR | Qty: 50 | Status: AC

## 2021-01-15 NOTE — Telephone Encounter (Signed)
Follow-up after same day discharge: Implant date: 01/14/21 MD: Virl Axe, MD Device: [place] Allure Location: Left Chest   Wound check visit: 01/24/21 90 day MD follow-up: 04/18/21  Remote Transmission received:Yes  Dressing removed: Dermabond in place

## 2021-01-15 NOTE — Telephone Encounter (Signed)
Attempted to call patient in regard to same day d/c. No answer, LMTCB.

## 2021-01-15 NOTE — Telephone Encounter (Signed)
-----   Message from [place], [place] sent at 01/14/2021 10:17 AM EDT ----- PPM >> CRT-P  Same day d/c  [facility]  SK

## 2021-01-16 ENCOUNTER — Encounter (HOSPITAL_COMMUNITY): Payer: Self-pay | Admitting: Internal Medicine

## 2021-01-24 ENCOUNTER — Other Ambulatory Visit: Payer: 59

## 2021-01-24 ENCOUNTER — Ambulatory Visit (INDEPENDENT_AMBULATORY_CARE_PROVIDER_SITE_OTHER): Payer: 59

## 2021-01-24 ENCOUNTER — Other Ambulatory Visit: Payer: Self-pay

## 2021-01-24 ENCOUNTER — Telehealth: Payer: Self-pay | Admitting: Internal Medicine

## 2021-01-24 DIAGNOSIS — I5022 Chronic systolic (congestive) heart failure: Secondary | ICD-10-CM

## 2021-01-24 DIAGNOSIS — I442 Atrioventricular block, complete: Secondary | ICD-10-CM | POA: Diagnosis not present

## 2021-01-24 DIAGNOSIS — Z79899 Other long term (current) drug therapy: Secondary | ICD-10-CM

## 2021-01-24 LAB — CUP PACEART INCLINIC DEVICE CHECK
Battery Remaining Longevity: 86 mo
Battery Voltage: 2.99 V
Brady Statistic RA Percent Paced: 46 %
Brady Statistic RV Percent Paced: 99.99 %
Date Time Interrogation Session: 20221006120708
Implantable Lead Implant Date: 20130115
Implantable Lead Implant Date: 20130115
Implantable Lead Implant Date: 20220926
Implantable Lead Location: 753858
Implantable Lead Location: 753859
Implantable Lead Location: 753860
Implantable Pulse Generator Implant Date: 20220926
Lead Channel Impedance Value: 1187.5 Ohm
Lead Channel Impedance Value: 375 Ohm
Lead Channel Impedance Value: 400 Ohm
Lead Channel Pacing Threshold Amplitude: 0.75 V
Lead Channel Pacing Threshold Amplitude: 0.75 V
Lead Channel Pacing Threshold Amplitude: 1.25 V
Lead Channel Pacing Threshold Amplitude: 1.25 V
Lead Channel Pacing Threshold Amplitude: 1.25 V
Lead Channel Pacing Threshold Amplitude: 1.25 V
Lead Channel Pacing Threshold Pulse Width: 0.4 ms
Lead Channel Pacing Threshold Pulse Width: 0.4 ms
Lead Channel Pacing Threshold Pulse Width: 0.6 ms
Lead Channel Pacing Threshold Pulse Width: 0.6 ms
Lead Channel Pacing Threshold Pulse Width: 0.8 ms
Lead Channel Pacing Threshold Pulse Width: 0.8 ms
Lead Channel Sensing Intrinsic Amplitude: 2.9 mV
Lead Channel Setting Pacing Amplitude: 1.75 V
Lead Channel Setting Pacing Amplitude: 2 V
Lead Channel Setting Pacing Amplitude: 2.5 V
Lead Channel Setting Pacing Pulse Width: 0.6 ms
Lead Channel Setting Pacing Pulse Width: 0.8 ms
Lead Channel Setting Sensing Sensitivity: 4 mV
Pulse Gen Model: 3562
Pulse Gen Serial Number: 6509372

## 2021-01-24 LAB — BASIC METABOLIC PANEL
BUN/Creatinine Ratio: 17 (ref 10–24)
BUN: 16 mg/dL (ref 8–27)
CO2: 26 mmol/L (ref 20–29)
Calcium: 9.3 mg/dL (ref 8.6–10.2)
Chloride: 100 mmol/L (ref 96–106)
Creatinine, Ser: 0.93 mg/dL (ref 0.76–1.27)
Glucose: 91 mg/dL (ref 70–99)
Potassium: 4.7 mmol/L (ref 3.5–5.2)
Sodium: 139 mmol/L (ref 134–144)
eGFR: 88 mL/min/{1.73_m2} (ref >59)

## 2021-01-24 NOTE — Patient Instructions (Addendum)
   After Your Pacemaker   Monitor your pacemaker site for redness,INCREASED swelling, and drainage. Call the device clinic at [phone] if you experience these symptoms or fever/chills.  Your incision was closed with Dermabond:  You may shower 1 day after your defibrillator implant and wash your incision with soap and water. Avoid lotions, ointments, or perfumes over your incision until it is well-healed.  You may use a hot tub or a pool after your wound check appointment if the incision is completely closed.  Do not lift, push or pull greater than 10 pounds with the affected arm until 6 weeks after your procedure. There are no other restrictions in arm movement after your wound check appointment.  You may drive, unless driving has been restricted by your healthcare providers.  Your Pacemaker is MRI compatible.  Remote monitoring is used to monitor your pacemaker from home. This monitoring is scheduled every 91 days by our office. It allows [place] to keep an eye on the functioning of your device to ensure it is working properly. You will routinely see your Electrophysiologist annually (more often if necessary).

## 2021-01-24 NOTE — Telephone Encounter (Signed)
  Cathy RN with [employer] calling, she said pt is candidate for HF case management and they need a copy of recent echo or heart cath. They need to know pt's ejection fraction. She said it can be fax to her fax# [phone]

## 2021-01-24 NOTE — Progress Notes (Signed)
Wound check appointment Dermabond removed. Wound without redness or edema. Incision edges approximated, wound well healed. Normal device function. Thresholds, sensing, and impedances consistent with implant measurements. Device programmed with nominal values Histogram distribution appropriate for patient and level of activity. No mode switches or high ventricular rates noted. Patient educated about wound care, arm mobility, lifting restrictions. ROV 04/18/21 with SK.

## 2021-01-29 ENCOUNTER — Other Ambulatory Visit: Payer: Self-pay

## 2021-01-29 ENCOUNTER — Ambulatory Visit: Payer: 59 | Admitting: Pulmonary Disease

## 2021-01-29 ENCOUNTER — Encounter: Payer: Self-pay | Admitting: Pulmonary Disease

## 2021-01-29 ENCOUNTER — Ambulatory Visit (INDEPENDENT_AMBULATORY_CARE_PROVIDER_SITE_OTHER): Payer: 59 | Admitting: Pulmonary Disease

## 2021-01-29 VITALS — BP 114/68 | HR 77 | Temp 98.5°F | Ht 68.0 in | Wt 168.4 lb

## 2021-01-29 DIAGNOSIS — J849 Interstitial pulmonary disease, unspecified: Secondary | ICD-10-CM

## 2021-01-29 DIAGNOSIS — J84112 Idiopathic pulmonary fibrosis: Secondary | ICD-10-CM

## 2021-01-29 DIAGNOSIS — R053 Chronic cough: Secondary | ICD-10-CM

## 2021-01-29 LAB — PULMONARY FUNCTION TEST
DL/VA % pred: 106 %
DL/VA: 4.33 ml/min/mmHg/L
DLCO cor % pred: 73 %
DLCO cor: 18.49 ml/min/mmHg
DLCO unc % pred: 74 %
DLCO unc: 18.85 ml/min/mmHg
FEF 25-75 Post: 7.03 L/sec
FEF 25-75 Pre: 5.94 L/sec
FEF2575-%Change-Post: 18 %
FEF2575-%Pred-Post: 295 %
FEF2575-%Pred-Pre: 249 %
FEV1-%Change-Post: 2 %
FEV1-%Pred-Post: 91 %
FEV1-%Pred-Pre: 88 %
FEV1-Post: 2.86 L
FEV1-Pre: 2.78 L
FEV1FVC-%Change-Post: 1 %
FEV1FVC-%Pred-Pre: 128 %
FEV6-%Change-Post: 1 %
FEV6-%Pred-Post: 74 %
FEV6-%Pred-Pre: 73 %
FEV6-Post: 2.99 L
FEV6-Pre: 2.94 L
FEV6FVC-%Pred-Post: 106 %
FEV6FVC-%Pred-Pre: 106 %
FVC-%Change-Post: 1 %
FVC-%Pred-Post: 70 %
FVC-%Pred-Pre: 69 %
FVC-Post: 2.99 L
FVC-Pre: 2.94 L
Post FEV1/FVC ratio: 96 %
Post FEV6/FVC ratio: 100 %
Pre FEV1/FVC ratio: 94 %
Pre FEV6/FVC Ratio: 100 %
RV % pred: 65 %
RV: 1.56 L
TLC % pred: 65 %
TLC: 4.45 L

## 2021-01-29 MED ORDER — HYDROCODONE BIT-HOMATROP MBR 5-1.5 MG/5ML PO SOLN
5.0000 mL | Freq: Four times a day (QID) | ORAL | 0 refills | Status: DC | PRN
Start: 2021-01-29 — End: 2022-01-16

## 2021-01-29 NOTE — Progress Notes (Signed)
PFT done today. 

## 2021-01-29 NOTE — Patient Instructions (Addendum)
We will check with Devki about the esbriet and insurance coverage. We do recommend starting esbriet based on you pulmonary function tests with the decrease in the diffusion capacity (DLCO)  Recommend using sugar free candies for the cough.   Try increasing the flutter valve therapy to 4 times per day and monitor for changes in the cough.   Try hycodan cough syrup every 6 hours as needed. Recommend not driving or operating heavy machinery or equipment when taking it.   Please discuss with your cardiologist about the cough worsening due to the entresto.   We will repeat a High Resolution CT chest scan in January to monitor your pulmonary fibrosis.

## 2021-01-29 NOTE — Progress Notes (Signed)
Synopsis: Referred in 04/2020 for chronic cough by Dr. Lamar Blinks, MD  Subjective:   PATIENT ID: William Curry GENDER: male DOB: 1950-10-28, MRN: [id number]  HPI  Chief Complaint  Patient presents with   Follow-up    F/U after PFT. States his cough has increased since last visit.     Mani Celestin is a 70 year old male, never smoker with with history of GERD who returns to pulmonary clinic for follow up of pulmonary fibrosis.   He reports his cough has progressed since last visit. Remains dry with occasional sputum production. He was started on entresto in August. He was trying menthol cough candies without relief.  He had repeat PFTs today which show a decline in DLCO to 18.49 from 22 and 26 previously.   OV 10/18/20 Repeat PFTs on 09/10/20 showed stability in his lung function with mild restrictive defect. DLCO remains normal.   He has been approved for esbriet and has meet with our pharmacy team about the benefits and risks of the medication as outlined in there meeting on 09/10/20.  He wishes to currently hold off on this therapy at this time until later this fall as he wants to avoid any of the side effects this summer.  He has had an increase in cough symptoms with sputum production since last visit.  He attributes this to his recent travels to the [facility] and changes in the climate from cold to hot weather.  He denies any shortness of breath.  His wife has accompanied him on today's visit.  He brought a copy of his mother's death certificate which had idiopathic pulmonary fibrosis listed as the cause of death.  He reports she was a very heavy smoker.  OV 08/27/2020: His HRCT Chest on 05/14/20 showed subpleural reticulation, ground-glass, traction bronchiectasis/bronchiolectasis and probable honeycombing with upper/midlung involvement.   His PFTs 06/21/20 show moderate restrictive defect with normal DLCO.   Inflammatory markers and myositis panel are negative.  We  reviewed his clinical history, HRCT chest and PFTs at multidisciplinary ILD conference in April 2022 with a recommendation to initiate anti-fibrotic therapy as his findings are concerning for pulmonary fibrosis.    He continues to experience cough with occasional clear/milky colored sputum. The nasal sprays have helped somehwat, he continues to use flonase. He has decreased his dose of pantoprazoled from 40mg  daily to 20mg  daily with no increase in his reflux symptoms.   OV 05/01/20: He reports having a cough since 2017 that has progressively worsened over the last 1 year. He does produce clear mucous at times. Has never cough up blood. He has been evaluated by GI and ENT for this cough in the past. He was started on pantoprazole over 1 year ago and he has not noticed any improvement in his cough, which he reports has worsened over the last year. He had laryngoscopy performed by ENT 10/10/19 which was unremarkable.   He denies any shortness of breath or wheezing with the cough. He denies overt heart burn or reflux symptoms. He denies seasonal allergies, sinus congestion or drainage.   He is a never smoker. He does have exposure to [employer] as he worked for [employer]. 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    He has a nephew with cystic fibrosis.  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 Right Knee   Tinea versicolor      Family History  Problem Relation Age of Onset   Cancer Mother 78       Breast    Diabetes Mother 31       on insulin   Breast cancer Mother    Diabetes Father 13       on insulin   Bladder Cancer Father    Colon polyps Sister    Colon cancer Maternal Grandfather    Esophageal cancer Neg Hx    Rectal cancer Neg Hx    Stomach cancer Neg Hx    Prostate cancer Neg Hx      Social History   Socioeconomic History   Marital status: Married    Spouse name: Not on file   Number of children: Not on file   Years of education: Not on file   Highest education level: Not on file  Occupational History   Occupation: Insurance underwriter / Accountant  Tobacco Use   Smoking status: Never   Smokeless tobacco: Former    Types: Chew   Tobacco comments:    admits do doing chewing tabacco. 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   To [place] 2013   Remarried 2007   [employer] '71-'75   Social Determinants of Health   Financial Resource Strain: Not on file  Food Insecurity: Not on file  Transportation Needs: Not on file  Physical Activity: Not on file  Stress: Not on file  Social Connections: Not on file  Intimate Partner Violence: Not on file     Allergies  Allergen Reactions   Penicillins Other (See Comments)    As a child     Outpatient Medications Prior to Visit  Medication Sig Dispense Refill   aspirin EC 81 MG tablet Take 1 tablet (81 mg total) by mouth daily. 30 tablet 11   bisoprolol (ZEBETA) 5 MG tablet Take 0.5 tablets (2.5 mg total) by mouth daily. 45 tablet 3   COVID-19 mRNA vaccine, Moderna, 100 MCG/0.5ML injection Inject into the muscle. 0.25 mL 0   fluticasone (FLONASE) 50 MCG/ACT nasal spray SPRAY 1 SPRAY  INTO BOTH NOSTRILS DAILY. 48 mL 2   ipratropium-albuterol (DUONEB) 0.5-2.5 (3) MG/3ML SOLN TAKE 3 MLS BY NEBULIZATION IN THE MORNING AND AT BEDTIME. 540 mL 3   Multiple Vitamin (MULTIVITAMIN) tablet Take 1 tablet by mouth daily. [facility]/ one a day men complete 50 +     Multiple Vitamins-Minerals (PRESERVISION AREDS 2+MULTI VIT PO) Take 1 tablet 2 (two) times daily by mouth.     OVER THE COUNTER MEDICATION Take  65 mg by mouth daily. CBD 1 dropper full     pantoprazole (PROTONIX) 20 MG tablet TAKE 1 TABLET BY MOUTH EVERY DAY 30 tablet 2   sacubitril-valsartan (ENTRESTO) 24-26 MG Take 1 tablet by mouth 2 (two) times daily. 180 tablet 3   sildenafil (REVATIO) 20 MG tablet TAKE 3 TO 5 TABLETS BY MOUTH ONCE DAILY AS NEEDED 50 tablet 2   spironolactone (ALDACTONE) 25 MG tablet Take 0.5 tablets (12.5 mg total) by mouth daily. 15 tablet 6   terbinafine (LAMISIL) 1 % cream Apply 1 application topically daily as needed (Jock itch).     valACYclovir (VALTREX) 500 MG tablet Take 500 mg by mouth as needed (Cold sores).     No facility-administered medications prior to visit.    Review of Systems  Constitutional:  Negative for chills, fever, malaise/fatigue and weight loss.  HENT:  Negative for congestion, sinus pain and sore throat.   Eyes: Negative.   Respiratory:  Positive for cough and sputum production. Negative for hemoptysis, shortness of breath and wheezing.   Cardiovascular:  Negative for chest pain, palpitations, orthopnea, claudication and leg swelling.  Gastrointestinal:  Negative for abdominal pain, heartburn, nausea and vomiting.  Genitourinary: Negative.   Musculoskeletal:  Negative for joint pain and myalgias.  Skin:  Negative for rash.  Neurological:  Negative for weakness.  Endo/Heme/Allergies: Negative.   Psychiatric/Behavioral: Negative.  The patient is not nervous/anxious.    Objective:   Vitals:   01/29/21 1344  BP: 114/68  Pulse: 77  Temp: 98.5 F (36.9 C)  TempSrc:  Oral  SpO2: 98%  Weight: 168 lb 6.4 oz (76.4 kg)  Height: 5\' 8"  (1.727 m)     Physical Exam Constitutional:      General: He is not in acute distress. HENT:     Head: Normocephalic and atraumatic.  Eyes:     Extraocular Movements: Extraocular movements intact.     Conjunctiva/sclera: Conjunctivae normal.     Pupils: Pupils are equal, round, and reactive to light.  Cardiovascular:     Rate and Rhythm: Normal rate and regular rhythm.     Pulses: Normal pulses.     Heart sounds: Normal heart sounds. No murmur heard. Pulmonary:     Effort: Pulmonary effort is normal.     Breath sounds: Rales (bibasilar) present. No wheezing or rhonchi.  Abdominal:     General: Bowel sounds are normal.     Palpations: Abdomen is soft.  Musculoskeletal:     Right lower leg: No edema.     Left lower leg: No edema.  Skin:    General: Skin is warm and dry.  Neurological:     General: No focal deficit present.     Mental Status: He is alert.   CBC    Component Value Date/Time   WBC 7.2 01/10/2021 1035   WBC 5.6 03/01/2020 0817   RBC 4.76 01/10/2021 1035   RBC 4.68 03/01/2020 0817   HGB 15.3 01/10/2021 1035   HCT 44.5 01/10/2021 1035   PLT 193 01/10/2021 1035   MCV 94 01/10/2021 1035   MCH 32.1 01/10/2021 1035   MCHC 34.4 01/10/2021 1035   MCHC 33.7 03/01/2020 0817   RDW 13.1 01/10/2021 1035   BMP Latest Ref Rng & Units 01/24/2021 01/10/2021 12/14/2020  Glucose 70 - 99 mg/dL 91 98 91  BUN 8 - 27 mg/dL 16 21 16   Creatinine 0.76 - 1.27 mg/dL 0.93 0.91 1.00  BUN/Creat Ratio 10 - 24 17 23  16  Sodium 134 - 144 mmol/L 139 139 139  Potassium 3.5 - 5.2 mmol/L 4.7 4.7 4.6  Chloride 96 - 106 mmol/L 100 102 101  CO2 20 - 29 mmol/L 26 29 25   Calcium 8.6 - 10.2 mg/dL 9.3 9.2 9.4   Chest imaging: HRCT Chest 05/14/20 1. Pulmonary parenchymal pattern of fibrosis may be due to nonspecific interstitial pneumonitis. Usual interstitial pneumonitis is not excluded. Findings are indeterminate for UIP per  consensus guidelines: Diagnosis of Idiopathic Pulmonary Fibrosis: An Official ATS/ERS/JRS/ALAT Clinical Practice Guideline. [place], Iss 5, [phone], Dec 20 2016. 2. Small to borderline enlarged mediastinal lymph nodes can be seen in the setting of interstitial lung disease. 3. Right renal stone.  Chest Radiograph 05/04/2018 Cardiac shadow is enlarged. Pacing device is noted. The lungs are well aerated bilaterally with some minimal interstitial changes. No focal confluent infiltrate or effusion is seen. No bony abnormality is noted.  PFT: PFT Results Latest Ref Rng & Units 01/29/2021 09/10/2020 06/21/2020  FVC-Pre L 2.94 3.01 2.96  FVC-Predicted Pre % 69 70 68  FVC-Post L 2.99 2.99 2.87  FVC-Predicted Post % 70 69 66  Pre FEV1/FVC % % 94 94 93  Post FEV1/FCV % % 96 94 95  FEV1-Pre L 2.78 2.84 2.75  FEV1-Predicted Pre % 88 89 86  FEV1-Post L 2.86 2.81 2.74  DLCO uncorrected ml/min/mmHg 18.85 26.58 22.53  DLCO UNC% % 74 105 89  DLCO corrected ml/min/mmHg 18.49 26.58 22.53  DLCO COR %Predicted % 73 105 89  DLVA Predicted % 106 136 144  TLC L 4.45 4.64 4.02  TLC % Predicted % 65 68 58  RV % Predicted % 65 67 50  PFTs 10/112022: mild restrictive defect and mild diffusion defect     Assessment & Plan:   IPF (idiopathic pulmonary fibrosis) (HCC) - Plan: CT Chest High Resolution  Chronic cough - Plan: HYDROcodone bit-homatropine (HYCODAN) 5-1.5 MG/5ML syrup  Interstitial pulmonary disease (HCC) - Plan: CT Chest High Resolution  Discussion: Ami Thornsberry is a 70 year old male, never smoker with with history of GERD who returns to pulmonary clinic for follow up of pulmonary fibrosis.   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  He has mild restrictive defect and a new mild diffusion defect on pulmonary function test. He has had an increase in his cough symptoms as well.   We discussed starting esbriet due to the progression we have  seen on PFTs as well as the possibility for the increase in his cough symptoms. We will reach out to our pharmacy team to help get him started on therapy.   We will check HRCT chest in 04/2021 which will be 1 year from his previous scan.   He is to continue fluticasone nasal spray for nasal congestion and postnasal drainage.  For his cough symptoms we will provide him with hycodan cough syrup and he will try sugar free candies for throat irritation. Will discuss with his cardiology team whether the recent addition of entresto could be a cause to his worsening cough.  He is to continue on pantoprazole 20 mg daily for GERD.  Follow up in 3 months.  Freda Jackson, MD [facility] Office: [phone]   Current Outpatient Medications:    aspirin EC 81 MG tablet, Take 1 tablet (81 mg total) by mouth daily., Disp: 30 tablet, Rfl: 11   bisoprolol (ZEBETA) 5 MG tablet, Take 0.5 tablets (2.5 mg total) by  mouth daily., Disp: 45 tablet, Rfl: 3   COVID-19 mRNA vaccine, Moderna, 100 MCG/0.5ML injection, Inject into the muscle., Disp: 0.25 mL, Rfl: 0   fluticasone (FLONASE) 50 MCG/ACT nasal spray, SPRAY 1 SPRAY INTO BOTH NOSTRILS DAILY., Disp: 48 mL, Rfl: 2   HYDROcodone bit-homatropine (HYCODAN) 5-1.5 MG/5ML syrup, Take 5 mLs by mouth every 6 (six) hours as needed for cough., Disp: 240 mL, Rfl: 0   ipratropium-albuterol (DUONEB) 0.5-2.5 (3) MG/3ML SOLN, TAKE 3 MLS BY NEBULIZATION IN THE MORNING AND AT BEDTIME., Disp: 540 mL, Rfl: 3   Multiple Vitamin (MULTIVITAMIN) tablet, Take 1 tablet by mouth daily. [facility]/ one a day men complete 50 +, Disp: , Rfl:    Multiple Vitamins-Minerals (PRESERVISION AREDS 2+MULTI VIT PO), Take 1 tablet 2 (two) times daily by mouth., Disp: , Rfl:    OVER THE COUNTER MEDICATION, Take 65 mg by mouth daily. CBD 1 dropper full, Disp: , Rfl:    pantoprazole (PROTONIX) 20 MG tablet, TAKE 1 TABLET BY MOUTH EVERY DAY, Disp: 30 tablet, Rfl: 2    sacubitril-valsartan (ENTRESTO) 24-26 MG, Take 1 tablet by mouth 2 (two) times daily., Disp: 180 tablet, Rfl: 3   sildenafil (REVATIO) 20 MG tablet, TAKE 3 TO 5 TABLETS BY MOUTH ONCE DAILY AS NEEDED, Disp: 50 tablet, Rfl: 2   spironolactone (ALDACTONE) 25 MG tablet, Take 0.5 tablets (12.5 mg total) by mouth daily., Disp: 15 tablet, Rfl: 6   terbinafine (LAMISIL) 1 % cream, Apply 1 application topically daily as needed (Jock itch)., Disp: , Rfl:    valACYclovir (VALTREX) 500 MG tablet, Take 500 mg by mouth as needed (Cold sores)., Disp: , Rfl:

## 2021-01-31 ENCOUNTER — Other Ambulatory Visit (HOSPITAL_COMMUNITY): Payer: Self-pay

## 2021-01-31 ENCOUNTER — Telehealth: Payer: Self-pay | Admitting: Pharmacist

## 2021-01-31 DIAGNOSIS — J84112 Idiopathic pulmonary fibrosis: Secondary | ICD-10-CM

## 2021-01-31 DIAGNOSIS — Z5181 Encounter for therapeutic drug level monitoring: Secondary | ICD-10-CM

## 2021-01-31 NOTE — Telephone Encounter (Addendum)
Spoke with patient about Esbriet. He is approved for Esbriet and is eligible for copay card. Per test claim, his copay is $5 but rx must be sent as brand necessary for him to qualify for copay card.  We reviewing dosing and side effects, including nausea and wearing sunscreen to prevent skin rash/sun sensitivity.  We reviewed lab monitoring. Future lab orders for hepatic function panel placed today. He will plan to stop by clinic on Monday 02/04/21 for labs. Rx for brand name Esbriet can be sent to [facility] as long as labs wnl  Knox Saliva, PharmD, MPH, BCPS Clinical Pharmacist (Rheumatology and Pulmonology)   ----- Message from Freddi Starr, MD sent at 01/31/2021  3:26 PM EDT ----- Regarding: Esbriet questions Hi Lonnetta Kniskern,   I belive Tyray is ready to start esbriet. He raised concerns for cost of the drug and coverage. Would you be able to review this and let him know what the cost will be to him.   Thanks, Wille Glaser

## 2021-02-02 ENCOUNTER — Encounter: Payer: Self-pay | Admitting: Pulmonary Disease

## 2021-02-04 ENCOUNTER — Other Ambulatory Visit (INDEPENDENT_AMBULATORY_CARE_PROVIDER_SITE_OTHER): Payer: 59

## 2021-02-04 DIAGNOSIS — Z5181 Encounter for therapeutic drug level monitoring: Secondary | ICD-10-CM

## 2021-02-04 LAB — HEPATIC FUNCTION PANEL
ALT: 16 U/L (ref 0–53)
AST: 20 U/L (ref 0–37)
Albumin: 4.1 g/dL (ref 3.5–5.2)
Alkaline Phosphatase: 52 U/L (ref 39–117)
Bilirubin, Direct: 0.1 mg/dL (ref 0.0–0.3)
Total Bilirubin: 0.7 mg/dL (ref 0.2–1.2)
Total Protein: 7.1 g/dL (ref 6.0–8.3)

## 2021-02-05 ENCOUNTER — Other Ambulatory Visit (HOSPITAL_COMMUNITY): Payer: Self-pay

## 2021-02-05 MED ORDER — ESBRIET 267 MG PO TABS
ORAL_TABLET | ORAL | 0 refills | Status: DC
  Filled 2021-02-05: qty 207, 28d supply, fill #0
  Filled 2021-02-05: qty 207, fill #0

## 2021-02-05 MED ORDER — ESBRIET 267 MG PO TABS
801.0000 mg | ORAL_TABLET | Freq: Three times a day (TID) | ORAL | 1 refills | Status: DC
  Filled 2021-02-05 – 2021-03-01 (×2): qty 270, 30d supply, fill #0

## 2021-02-05 NOTE — Telephone Encounter (Signed)
I have called and LM on VM for the pt to call back and we will get that APPT scheduled for him to see JD in 6 weeks after starting the esbriet.

## 2021-02-05 NOTE — Telephone Encounter (Addendum)
LFTs drawn on 02/04/21 wnl to proceed with Esbriet.  Hepatic Function Latest Ref Rng & Units 02/04/2021 03/01/2020 03/01/2020  Total Protein 6.0 - 8.3 g/dL 7.1 - 6.9  Albumin 3.5 - 5.2 g/dL 4.1 - 4.2  AST 0 - 37 U/L 20 - 17  ALT 0 - 53 U/L 16 - 14  Alk Phosphatase 39 - 117 U/L 52 - 52  Total Bilirubin 0.2 - 1.2 mg/dL 0.7 1.1 1.1  Bilirubin, Direct 0.0 - 0.3 mg/dL 0.1 0.3(H) -   Rx for Esbriet titration sent to [facility] as well as maintenance rx. Patient must fill brand name d/t copay card only covering brand name.  He has been advised to notify clinic if there are insurance changes since his wife is going through open enrollment right now and we will have to run new prior authorization  Patient's copay card already in [place] to triage to have patient scheduled for f/u with Dr. Erin Fulling in 6 weeks. Will need repeat LFTs at that visit.  Routing to [employer] to help coordinator of Esbriet shipment to patient's home.  Knox Saliva, PharmD, MPH, BCPS Clinical Pharmacist (Rheumatology and Pulmonology)

## 2021-02-05 NOTE — Telephone Encounter (Signed)
Spoke to patient, shipment scheduled to deliver 10/20 to pt's home.

## 2021-02-06 ENCOUNTER — Other Ambulatory Visit (HOSPITAL_COMMUNITY): Payer: Self-pay

## 2021-02-08 ENCOUNTER — Telehealth: Payer: Self-pay

## 2021-02-08 MED ORDER — LOSARTAN POTASSIUM 50 MG PO TABS: 50.0000 mg | ORAL_TABLET | Freq: Every day | ORAL | 3 refills | Status: DC

## 2021-02-08 NOTE — Telephone Encounter (Signed)
-----   Message from Shirley Friar, PA-C sent at 02/05/2021  1:55 PM EDT ----- Regarding: RE: William Curry and Cough symptoms If it is felt to align with starting Entresto we can change to losartan 50 mg daily and we can titrate up as tolerated.  Rosann Auerbach, He sees Dr. Caryl Comes next week if he (patient) would prefer to discuss at that visit prior to making the change.   We can always go back on in the future if his cough is not changed by stopping it.   Legrand Como [address] [place], PA-C  02/05/2021 1:58 PM    ----- Message ----- From: Freddi Starr, MD Sent: 02/02/2021   3:29 PM EDT To: Deboraha Sprang, MD, # Subject: William Curry and Cough symptoms                    Hi Dr. Caryl Comes,  I saw this patient recently in clinic for pulmonary fibrosis. He has had an increase in his cough symptoms over recent months. The cough could be worsening due to his IPF, but I wanted to get your thoughts on it possibly being related to the recent addition of entresto if this could be a culprit to his worsening cough symptoms.   Thanks for your input.   Wille Glaser

## 2021-02-08 NOTE — Telephone Encounter (Signed)
Spoke with pt and discussed changing Entresto to Losartan.  Pt states he has spoken in detail with Dr Erin Fulling and is open to go ahead and change medication.  Entrest discontinued and Losartan 50mg  - 1 tablet  by mouth daily #90 with 3RF sent to pharmacy as prescribed as requested.  Pt verbalizes understanding.  Pt also states he is having a billing issue and is needing direction as to how to resolve.  Pt has Medicare Part A but no Part B as he is on his wife's [facility] policy.  Pt states Medicare Part A continues to be billed for office visits.  Pt advised I will send a message to billing for the pt.  Pt thanked Therapist, sports for the assistance.

## 2021-02-12 ENCOUNTER — Other Ambulatory Visit: Payer: Self-pay

## 2021-02-12 ENCOUNTER — Telehealth (INDEPENDENT_AMBULATORY_CARE_PROVIDER_SITE_OTHER): Payer: 59 | Admitting: Internal Medicine

## 2021-02-12 ENCOUNTER — Encounter: Payer: Self-pay | Admitting: Internal Medicine

## 2021-02-12 VITALS — BP 111/70 | HR 69 | Ht 69.0 in | Wt 163.2 lb

## 2021-02-12 DIAGNOSIS — I453 Trifascicular block: Secondary | ICD-10-CM

## 2021-02-12 DIAGNOSIS — I447 Left bundle-branch block, unspecified: Secondary | ICD-10-CM

## 2021-02-12 DIAGNOSIS — I5022 Chronic systolic (congestive) heart failure: Secondary | ICD-10-CM

## 2021-02-12 DIAGNOSIS — Z950 Presence of cardiac pacemaker: Secondary | ICD-10-CM

## 2021-02-12 DIAGNOSIS — I471 Supraventricular tachycardia: Secondary | ICD-10-CM

## 2021-02-12 DIAGNOSIS — I429 Cardiomyopathy, unspecified: Secondary | ICD-10-CM

## 2021-02-12 NOTE — Progress Notes (Signed)
Electrophysiology TeleHealth Note   Due to national recommendations of social distancing due to COVID 19, an audio/video telehealth visit is felt to be most appropriate for this patient at this time.  See MyChart message from today for the patient's consent to telehealth for [facility].   Date:  02/12/2021   ID:  William Curry, DOB 09-Nov-1950, MRN [id number]  Location: patient's home  Provider location: [address] [place]  Evaluation Performed: Follow-up visit  PCP:  Copland, Gay Filler, MD  Cardiologist:   none Electrophysiologist:  SK   Chief Complaint:  abnormal imaging studies   History of Present Illness:    William Curry is a 70 y.o. male who presents via audio/video conferencing for a telehealth visit today.  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  This visit is to discuss the below outlined imaging tests  Because of high burden RV pacing (80%) imaging >> LV dysfunction new, with WMA and myoview>> perfusion defect consistent with MI>> Cath No obstructive CAD  Intercurrent addition of entresto which he initially tolerated well; however, developed a cough prompting the change from Entresto to losartan.  9/22 underwent CRT upgrade; post CRT ECG had a negative QRS lead V1 and an RS in lead V1-QRS duration 136; preupgrade QRS was 182 ms  The patient denies chest pain, nocturnal dyspnea, orthopnea or peripheral edema.  There have been no palpitations, lightheadedness or syncope.  Complains of chronic dyspnea.       Date   Cr              K         Hgb    11/18 1.06 4.5      9/20 1.01 4.3         16.1    11/21 0.94 4.4          15.2     8/22 0.99 4.2 15.2      DATE TEST EF%    01/13 Echo  60-65 %    07/22 Echo 30-35%     7/22 Myoview  30-35% Inferior perfusion defect (false positive)  8/22 LVH  No Obs CAD      The patient denies symptoms of fevers, chills, cough, or new SOB worrisome for COVID 19.    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 Right Knee   Tinea versicolor     Past Surgical History:  Procedure Laterality Date   BIV UPGRADE N/A 01/14/2021   Procedure: BIV PPM UPGRADE;  Surgeon: Deboraha Sprang, MD;  Location: [place] [facility];  Service: Cardiovascular;  Laterality: N/A;   CARTILAGE REPAIR  Nov.-1989   Left knee-HARD TO WAKE UP AFTER SX   COLONOSCOPY     KNEE ARTHROSCOPY WITH ANTERIOR CRUCIATE LIGAMENT (ACL) REPAIR  [phone]   Left knee-HARD TO WAKE UP AFTER SX   LEFT HEART CATH AND CORONARY ANGIOGRAPHY N/A 12/18/2020   Procedure:  LEFT HEART CATH AND CORONARY ANGIOGRAPHY;  Surgeon: Belva Crome, MD;  Location: [place] [facility];  Service: Cardiovascular;  Laterality: N/A;   LITHOTRIPSY  Jun-2010   Kidney stone (1st stone 1994 - Passed)   PACEMAKER INSTALLATION  Jan-2013   Complete Heart Block   SKIN BIOPSY     RIght knee for skin cancer    Current Outpatient Medications  Medication Sig Dispense Refill   aspirin EC 81 MG tablet Take 1 tablet (81 mg total) by mouth daily. 30 tablet 11   bisoprolol (ZEBETA) 5 MG tablet Take 0.5 tablets (2.5 mg total) by mouth daily. 45 tablet 3   COVID-19 mRNA vaccine, Moderna, 100 MCG/0.5ML injection Inject into the muscle. 0.25 mL 0   ENTRESTO 24-26 MG Take 1 tablet by mouth 2 (two) times daily.     ESBRIET 267 MG TABS Take 1 tab three times daily for 7 days, then 2 tabs three times daily for 7 days, then 3 tabs three times daily thereafter. Month 1 207 tablet 0   ESBRIET 267 MG  TABS Take 3 tablets (801 mg total) by mouth 3 (three) times daily with meals. Month 2 and onward 270 tablet 1   fluticasone (FLONASE) 50 MCG/ACT nasal spray SPRAY 1 SPRAY INTO BOTH NOSTRILS DAILY. 48 mL 2   HYDROcodone bit-homatropine (HYCODAN) 5-1.5 MG/5ML syrup Take 5 mLs by mouth every 6 (six) hours as needed for cough. 240 mL 0   ipratropium-albuterol (DUONEB) 0.5-2.5 (3) MG/3ML SOLN TAKE 3 MLS BY NEBULIZATION IN THE MORNING AND AT BEDTIME. 540 mL 3   Multiple Vitamin (MULTIVITAMIN) tablet Take 1 tablet by mouth daily. [facility]/ one a day men complete 50 +     Multiple Vitamins-Minerals (PRESERVISION AREDS 2+MULTI VIT PO) Take 1 tablet 2 (two) times daily by mouth.     OVER THE COUNTER MEDICATION Take 65 mg by mouth daily. CBD 1 dropper full     pantoprazole (PROTONIX) 20 MG tablet TAKE 1 TABLET BY MOUTH EVERY DAY 30 tablet 2   sildenafil (REVATIO) 20 MG tablet TAKE 3 TO 5 TABLETS BY MOUTH ONCE DAILY AS NEEDED 50 tablet 2   spironolactone (ALDACTONE) 25 MG tablet Take 0.5 tablets (12.5 mg total) by mouth daily. 15 tablet 6   terbinafine (LAMISIL) 1 % cream Apply 1 application topically daily as needed (Jock itch).     valACYclovir (VALTREX) 500 MG tablet Take 500 mg by mouth as needed (Cold sores).     losartan (COZAAR) 50 MG tablet Take 1 tablet (50 mg total) by mouth daily. (Patient not taking: Reported on 02/12/2021) 90 tablet 3   No current facility-administered medications for this visit.    Allergies:   Penicillins     ROS:  Please see the history of present illness.   All other systems are personally reviewed and negative.    Exam:    Vital Signs:  BP 111/70   Pulse 69   Ht 5\' 9"  (1.753 m)   Wt 163 lb 3.2 oz (74 kg)   BMI 24.10 kg/m     Well appearing, alert and conversant, regular work of breathing,  good skin color Eyes- anicteric, neuro- grossly intact, skin- no apparent rash or lesions or cyanosis, mouth- oral mucosa is pink   Labs/Other Tests and Data Reviewed:     Recent Labs: 01/10/2021: Hemoglobin 15.3; Platelets 193 01/24/2021: BUN 16; Creatinine, Ser 0.93; Potassium 4.7; Sodium 139 02/04/2021: ALT 16   Wt Readings from Last 3 Encounters:  02/12/21 163 lb 3.2 oz (74 kg)  01/29/21 168 lb 6.4 oz (76.4 kg)  01/14/21 169 lb (76.7 kg)     Other studies personally reviewed: Additional studies/ records that were reviewed today include: (As above)   Review of the above records today demonstrates:   Prior radiographs: (As above) reviewed images with patient     ASSESSMENT & PLAN:    Atrial tach   Intermittent complete heart block   Pacemaker  St Jude  CRT upgrade 9/22  Cardiomyopathy, newly identified, presumably ischemic  PVC  S/p CRT upgrade.  Tolerated well.    Delene Loll may have aggravated his chronic cough--will hold for about 3 weeks as exclusion to its contribution  Will plan echo in about 3 months prior to visit   Continue spiro 12.5 and bisoprolol 2.5; tolerating; recently started Esbriet   No obstructive CAD so will stop ASA         COVID 19 screen The patient denies symptoms of COVID 19 at this time.  The importance of social distancing was discussed today.  Follow-up:  As Scheduled     Current medicines are reviewed at length with the patient today.   The patient has concerns regarding his medicines.  The following changes were made today: Entresto exclusion trial x3 weeks; discontinue aspirin  Labs/ tests ordered today include: Echocardiogram prior to 12/29 office visit No orders of the defined types were placed in this encounter.     Signed, Virl Axe, MD  02/12/2021 10:03 AM     [address] [place] [address] [phone] (office) [phone] (fax)

## 2021-02-12 NOTE — Patient Instructions (Addendum)
Medication Instructions:  - Your physician has recommended you make the following change in your medication:   1) STOP Aspirin  2) HOLD Entresto x 3 weeks to see this helps improve the cough - please call the office/ send a MyChart message at that time to let Dr. Caryl Comes know how you are doing  *If you need a refill on your cardiac medications before your next appointment, please call your pharmacy*   Lab Work: - none ordered  If you have labs (blood work) drawn today and your tests are completely normal, you will receive your results only by: [place] (if you have MyChart) OR A paper copy in the mail If you have any lab test that is abnormal or we need to change your treatment, we will call you to review the results.   Testing/Procedures: - Your physician has requested that you have an echocardiogram [facility] office) just prior to 04/18/21 follow up with Dr. Caryl Comes. Echocardiography is a painless test that uses sound waves to create images of your heart. It provides your doctor with information about the size and shape of your heart and how well your heart's chambers and valves are working. This procedure takes approximately one hour. There are no restrictions for this procedure.There is a possibility that an IV may need to be started during your test to inject an image enhancing agent. This is done to obtain more optimal pictures of your heart. Therefore we ask that you do at least drink some water prior to coming in to hydrate your veins.     Follow-Up: At [facility], you and your health needs are our priority.  As part of our continuing mission to provide you with exceptional heart care, we have created designated Provider Care Teams.  These Care Teams include your primary Cardiologist (physician) and Advanced Practice Providers (APPs -  Physician Assistants and Nurse Practitioners) who all work together to provide you with the care you need, when you need it.  We recommend  signing up for the patient portal called "MyChart".  Sign up information is provided on this After Visit Summary.  MyChart is used to connect with patients for Virtual Visits (Telemedicine).  Patients are able to view lab/test results, encounter notes, upcoming appointments, etc.  Non-urgent messages can be sent to your provider as well.   To learn more about what you can do with MyChart, go to [email or web address].    Your next appointment:   As scheduled   The format for your next appointment:   In Person  Provider:   Virl Axe, MD   Other Instructions N/a

## 2021-02-13 NOTE — Addendum Note (Signed)
Addended by: Alvis Lemmings C on: 02/13/2021 08:59 AM   Modules accepted: Orders

## 2021-02-18 ENCOUNTER — Other Ambulatory Visit: Payer: Self-pay | Admitting: Gastroenterology

## 2021-02-19 NOTE — Progress Notes (Signed)
[facility] [place], [place], [place] [address] [phone]  Date:  02/20/2021   Name:  William Curry   DOB:  1950/09/04   MRN:  [id number]  PCP:  Darreld Mclean, MD    Chief Complaint: Annual Exam (Concerns/ questions: yes- would like to discuss changes over the last year/Flu shot today: yes/)   History of Present Illness:  William Curry is a 70 y.o. very pleasant male patient who presents with the following:  Patient seen today for physical exam Most recent visit with myself about 1 year ago- history of complete AV block with pacemaker, ED, GERD, hypertension, macular degeneration, chronic tinea versicolor, idiopathic pulmonary fibrosis Today patient shared with me he has been told his pulmonary fibrosis is terminal and he can likely expect to live another 3 to 5 years Married to [name] 4 children and 6 grandchildren in [place] and one stepson/2 grandchildren locally His son [name] died 2023/02/04 due to pneumonia and heart issues  He had an upgraded stage II pacemaker placed in September He had a virtual visit with Dr. Caryl Comes October 25-this note mentions new identified cardiomyopathy, they plan to do an echo  He is also being seen by pulmonology, Dr. Rayburn Felt recent visit October 11 for concern of idiopathic pulmonary fibrosis: William Curry is a 70 year old male, never smoker with with history of GERD who returns to pulmonary clinic for follow up of pulmonary fibrosis.  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  He has mild restrictive defect and a new mild diffusion defect on pulmonary function test. He has had an increase in his cough symptoms as well.   We discussed starting esbriet due to the progression we have seen on PFTs as well as the possibility for the increase in his cough symptoms. We will reach out to our pharmacy team to help get him started on therapy.   We will check HRCT chest in 04/2021 which will be 1 year from his previous scan.  He is to continue fluticasone nasal spray for nasal congestion and postnasal drainage. For his cough symptoms we will provide him with hycodan cough syrup and he will try sugar free candies for throat irritation. Will discuss with his cardiology team whether the recent addition of entresto could be a cause to his worsening cough. He is to continue on pantoprazole 20 mg daily for GERD. Follow up in 3 months.   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   Grief at loss of child 02/23/2020   Knee pain 03/02/2017   Insomnia 06/11/2015   Erectile dysfunction 06/11/2015   Medication monitoring encounter 05/17/2014   GERD (gastroesophageal reflux disease) 05/17/2014   Atrioventricular block, complete--intermittent 06/13/2013   Elevated blood pressure 06/13/2013   Pacemaker-St Judes 05/04/2012   LBBB (left bundle branch block) 04/02/2012   Tinea versicolor 04/02/2012   DDD (degenerative disc disease), lumbar 04/02/2012    Past Medical History:  Diagnosis  Date   DDD (degenerative disc disease), lumbar    GERD (gastroesophageal reflux disease)    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 Right Knee   Tinea versicolor     Past Surgical History:  Procedure Laterality Date   BIV UPGRADE N/A 01/14/2021   Procedure: BIV PPM UPGRADE;  Surgeon: Deboraha Sprang, MD;  Location: [place] [facility];  Service: Cardiovascular;  Laterality: N/A;   CARTILAGE REPAIR  Nov.-1989   Left knee-HARD TO WAKE UP AFTER SX   COLONOSCOPY     KNEE ARTHROSCOPY WITH ANTERIOR CRUCIATE LIGAMENT (ACL) REPAIR  [phone]   Left knee-HARD TO WAKE UP AFTER SX   LEFT HEART CATH AND CORONARY ANGIOGRAPHY N/A 12/18/2020   Procedure: LEFT HEART CATH AND CORONARY ANGIOGRAPHY;  Surgeon: Belva Crome, MD;  Location: [place] [facility];  Service: Cardiovascular;  Laterality: N/A;   LITHOTRIPSY  Jun-2010   Kidney stone (1st stone 1994 - Passed)   PACEMAKER INSTALLATION  Jan-2013   Complete Heart Block   SKIN BIOPSY     RIght knee for skin cancer    Social History   Tobacco Use   Smoking status: Never   Smokeless tobacco: Former    Types: Chew   Tobacco comments:    admits do doing chewing tabacco. stopped in 2005  Vaping Use   Vaping Use: Never used  Substance Use Topics   Alcohol use: Yes    Alcohol/week: 6.0 standard drinks    Types: 6 Cans of beer per week    Comment: 4 - 6 beers a week   Drug use: No    Family History  Problem Relation Age of Onset   Cancer Mother 64       Breast   Diabetes Mother 72       on insulin   Breast cancer Mother    Diabetes Father 86       on insulin   Bladder Cancer Father    Colon polyps Sister    Colon cancer Maternal Grandfather    Esophageal cancer Neg Hx    Rectal cancer Neg Hx    Stomach cancer Neg Hx    Prostate cancer Neg Hx     Allergies  Allergen Reactions   Penicillins Other (See Comments)    As a child    Medication list has been reviewed and updated.  Current Outpatient Medications on File Prior to Visit  Medication Sig Dispense Refill   bisoprolol (ZEBETA) 5 MG tablet Take 0.5 tablets (2.5 mg total) by mouth daily. 45  tablet 3   COVID-19 mRNA vaccine, Moderna, 100 MCG/0.5ML injection Inject into the muscle. 0.25 mL 0   ENTRESTO 24-26 MG Take 1 tablet by mouth 2 (two) times daily. Holding until 03/10/21.     ESBRIET 267 MG TABS Take 1 tab three times daily for 7 days, then 2 tabs three times daily for 7 days, then 3 tabs three times daily thereafter. Month 1 207 tablet 0   ESBRIET 267 MG TABS Take 3 tablets (801 mg total) by mouth 3 (three) times daily with meals. Month 2 and onward 270 tablet 1   fluticasone (FLONASE) 50 MCG/ACT nasal spray SPRAY  1 SPRAY INTO BOTH NOSTRILS DAILY. 48 mL 2   HYDROcodone bit-homatropine (HYCODAN) 5-1.5 MG/5ML syrup Take 5 mLs by mouth every 6 (six) hours as needed for cough. 240 mL 0   ipratropium-albuterol (DUONEB) 0.5-2.5 (3) MG/3ML SOLN TAKE 3 MLS BY NEBULIZATION IN THE MORNING AND AT BEDTIME. 540 mL 3   losartan (COZAAR) 50 MG tablet Take 1 tablet (50 mg total) by mouth daily. (Patient taking differently: Take 50 mg by mouth daily. Holding until 03/10/21.) 90 tablet 3   Multiple Vitamin (MULTIVITAMIN) tablet Take 1 tablet by mouth daily. [facility]/ one a day men complete 50 +     Multiple Vitamins-Minerals (PRESERVISION AREDS 2+MULTI VIT PO) Take 1 tablet 2 (two) times daily by mouth.     OVER THE COUNTER MEDICATION Take 65 mg by mouth daily. CBD 1 dropper full     pantoprazole (PROTONIX) 20 MG tablet TAKE 1 TABLET BY MOUTH EVERY DAY 90 tablet 0   sildenafil (REVATIO) 20 MG tablet TAKE 3 TO 5 TABLETS BY MOUTH ONCE DAILY AS NEEDED 50 tablet 2   spironolactone (ALDACTONE) 25 MG tablet Take 0.5 tablets (12.5 mg total) by mouth daily. 15 tablet 6   terbinafine (LAMISIL) 1 % cream Apply 1 application topically daily as needed (Jock itch).     valACYclovir (VALTREX) 500 MG tablet Take 500 mg by mouth as needed (Cold sores).     No current facility-administered medications on file prior to visit.    Review of Systems:  As per HPI- otherwise negative.   Physical  Examination: Vitals:   02/20/21 1109  BP: 112/72  Pulse: 67  Resp: 18  Temp: 97.9 F (36.6 C)  SpO2: 97%   Vitals:   02/20/21 1109  Weight: 167 lb 9.6 oz (76 kg)  Height: 5\' 9"  (1.753 m)   Body mass index is 24.75 kg/m. Ideal Body Weight: Weight in (lb) to have BMI = 25: 168.9  GEN: no acute distress.  Looks well, does cough occasionally HEENT: Atraumatic, Normocephalic.  Ears and Nose: No external deformity. CV: RRR, No M/G/R. No JVD. No thrill. No extra heart sounds. PULM: CTA B, no wheezes, crackles, rhonchi. No retractions. No resp. distress. No accessory muscle use. ABD: S, NT, ND. No rebound. No HSM. EXTR: No c/c/e PSYCH: Normally interactive. Conversant.    Assessment and Plan: Physical exam  Dyslipidemia - Plan: PSA, CANCELED: PSA  Screening for prostate cancer - Plan: Lipid panel, CANCELED: Lipid panel  Pacemaker-St Judes  ILD (interstitial lung disease) ([place])  Need for influenza vaccination - Plan: Flu Vaccine QUAD High Dose(Fluad) Physical exam today.  Encouraged healthy diet and exercise routine  He has been seen by cardiology and also pulmonology.  Very unfortunately Skipper has significant heart problems as well as pulmonary fibrosis.  Despite this, he is physically active without any major restrictions.  He states he can do as he pleases physically  Gave flu shot today He plans to get the updated COVID booster and shingles vaccine soon  Right now Phillippe does not feel that he is depressed.  I have asked him to please let me know if this becomes an issue Signed Lamar Blinks, MD

## 2021-02-19 NOTE — Patient Instructions (Addendum)
It was good to see you again today, I will be in touch with your labs as soon as possible Please let me know if I can do anything else to help you Please get your newest covid booster at your earliest convenience- at pharmacy  You can also plan to get the shingles vaccine update- shingrix- at your convenience

## 2021-02-20 ENCOUNTER — Ambulatory Visit (INDEPENDENT_AMBULATORY_CARE_PROVIDER_SITE_OTHER): Payer: 59 | Admitting: Family Medicine

## 2021-02-20 ENCOUNTER — Other Ambulatory Visit: Payer: Self-pay

## 2021-02-20 ENCOUNTER — Encounter: Payer: Self-pay | Admitting: Family Medicine

## 2021-02-20 VITALS — BP 112/72 | HR 67 | Temp 97.9°F | Resp 18 | Ht 69.0 in | Wt 167.6 lb

## 2021-02-20 DIAGNOSIS — E785 Hyperlipidemia, unspecified: Secondary | ICD-10-CM | POA: Diagnosis not present

## 2021-02-20 DIAGNOSIS — J849 Interstitial pulmonary disease, unspecified: Secondary | ICD-10-CM

## 2021-02-20 DIAGNOSIS — Z0000 Encounter for general adult medical examination without abnormal findings: Secondary | ICD-10-CM | POA: Diagnosis not present

## 2021-02-20 DIAGNOSIS — Z23 Encounter for immunization: Secondary | ICD-10-CM | POA: Diagnosis not present

## 2021-02-20 DIAGNOSIS — Z125 Encounter for screening for malignant neoplasm of prostate: Secondary | ICD-10-CM | POA: Diagnosis not present

## 2021-02-20 DIAGNOSIS — Z950 Presence of cardiac pacemaker: Secondary | ICD-10-CM

## 2021-02-25 ENCOUNTER — Other Ambulatory Visit: Payer: Self-pay

## 2021-02-25 ENCOUNTER — Other Ambulatory Visit (INDEPENDENT_AMBULATORY_CARE_PROVIDER_SITE_OTHER): Payer: 59

## 2021-02-25 DIAGNOSIS — E785 Hyperlipidemia, unspecified: Secondary | ICD-10-CM | POA: Diagnosis not present

## 2021-02-25 DIAGNOSIS — Z125 Encounter for screening for malignant neoplasm of prostate: Secondary | ICD-10-CM

## 2021-02-25 LAB — PSA: PSA: 6.19 ng/mL — ABNORMAL HIGH (ref 0.10–4.00)

## 2021-02-25 LAB — LIPID PANEL
Cholesterol: 151 mg/dL (ref 0–200)
HDL: 36.7 mg/dL — ABNORMAL LOW (ref >39.00)
LDL Cholesterol: 97 mg/dL (ref 0–99)
NonHDL: 114.08
Total CHOL/HDL Ratio: 4
Triglycerides: 86 mg/dL (ref 0.0–149.0)
VLDL: 17.2 mg/dL (ref 0.0–40.0)

## 2021-02-26 ENCOUNTER — Encounter: Payer: Self-pay | Admitting: Family Medicine

## 2021-02-26 DIAGNOSIS — R972 Elevated prostate specific antigen [PSA]: Secondary | ICD-10-CM

## 2021-02-26 NOTE — Progress Notes (Signed)
Received patient labs as below, his physical was on 11/2  Results for orders placed or performed in visit on 02/25/21  PSA  Result Value Ref Range   PSA 6.19 Repeated and verified X2. (H) 0.10 - 4.00 ng/mL  Lipid panel  Result Value Ref Range   Cholesterol 151 0 - 200 mg/dL   Triglycerides 86.0 0.0 - 149.0 mg/dL   HDL 36.70 (L) >39.00 mg/dL   VLDL 17.2 0.0 - 40.0 mg/dL   LDL Cholesterol 97 0 - 99 mg/dL   Total CHOL/HDL Ratio 4    NonHDL 114.08

## 2021-03-01 ENCOUNTER — Other Ambulatory Visit (HOSPITAL_COMMUNITY): Payer: Self-pay

## 2021-03-05 ENCOUNTER — Other Ambulatory Visit (HOSPITAL_COMMUNITY): Payer: Self-pay

## 2021-03-21 ENCOUNTER — Other Ambulatory Visit: Payer: Self-pay

## 2021-03-21 ENCOUNTER — Encounter: Payer: Self-pay | Admitting: Pulmonary Disease

## 2021-03-21 ENCOUNTER — Other Ambulatory Visit (HOSPITAL_COMMUNITY): Payer: Self-pay

## 2021-03-21 ENCOUNTER — Ambulatory Visit (INDEPENDENT_AMBULATORY_CARE_PROVIDER_SITE_OTHER): Payer: 59 | Admitting: Pulmonary Disease

## 2021-03-21 VITALS — BP 118/66 | HR 78 | Ht 69.0 in | Wt 168.8 lb

## 2021-03-21 DIAGNOSIS — J84112 Idiopathic pulmonary fibrosis: Secondary | ICD-10-CM

## 2021-03-21 DIAGNOSIS — Z5181 Encounter for therapeutic drug level monitoring: Secondary | ICD-10-CM | POA: Diagnosis not present

## 2021-03-21 LAB — HEPATIC FUNCTION PANEL
ALT: 14 U/L (ref 0–53)
AST: 17 U/L (ref 0–37)
Albumin: 4.1 g/dL (ref 3.5–5.2)
Alkaline Phosphatase: 56 U/L (ref 39–117)
Bilirubin, Direct: 0.1 mg/dL (ref 0.0–0.3)
Total Bilirubin: 0.8 mg/dL (ref 0.2–1.2)
Total Protein: 7.4 g/dL (ref 6.0–8.3)

## 2021-03-21 MED ORDER — ESBRIET 801 MG PO TABS
1.0000 | ORAL_TABLET | Freq: Three times a day (TID) | ORAL | 1 refills | Status: DC
  Filled 2021-03-21: qty 180, fill #0
  Filled 2021-03-29: qty 90, 30d supply, fill #0

## 2021-03-21 MED ORDER — PIRFENIDONE 801 MG PO TABS
1.0000 | ORAL_TABLET | Freq: Three times a day (TID) | ORAL | 6 refills | Status: DC
  Filled 2021-03-21: qty 90, fill #0

## 2021-03-21 NOTE — Progress Notes (Signed)
Synopsis: Referred in 04/2020 for chronic cough by Dr. Lamar Blinks, MD  Subjective:   PATIENT ID: William Curry GENDER: male DOB: 1950/06/20, MRN: [id number]  HPI  Chief Complaint  Patient presents with   Follow-up    F/U after starting Esbriet. Occasional dizziness after taking medication.    William Curry is a 70 year old male, never smoker with with history of GERD who returns to pulmonary clinic for follow up of pulmonary fibrosis.   He started with Esbriet therapy on 02/07/2021 and has not tolerated increasing to 3 tabs 3 times per day.  He does report dizziness 30 minutes after taking Esbriet.  He has not checked his blood pressure during these times.  He has been holding losartan or Entresto.  He also reports developing some itchy areas of skin since starting esbriet. He is using CeraVe cream which helps.   He reports his cough is about 25% improved since last visit as we discussed holding Entresto for possible contribution to his cough symptoms.  He has been holding his fluticasone nasal spray due to issues with epistaxis.  He and his wife are planning to move back to [place], [place] in May 2023.  OV 01/29/21 He reports his cough has progressed since last visit. Remains dry with occasional sputum production. He was started on entresto in August. He was trying menthol cough candies without relief.  He had repeat PFTs today which show a decline in DLCO to 18.49 from 22 and 26 previously.   OV 10/18/20 Repeat PFTs on 09/10/20 showed stability in his lung function with mild restrictive defect. DLCO remains normal.   He has been approved for esbriet and has meet with our pharmacy team about the benefits and risks of the medication as outlined in there meeting on 09/10/20.  He wishes to currently hold off on this therapy at this time until later this fall as he wants to avoid any of the side effects this summer.  He has had an increase in cough symptoms with sputum production  since last visit.  He attributes this to his recent travels to the [facility] and changes in the climate from cold to hot weather.  He denies any shortness of breath.  His wife has accompanied him on today's visit.  He brought a copy of his mother's death certificate which had idiopathic pulmonary fibrosis listed as the cause of death.  He reports she was a very heavy smoker.  OV 08/27/2020: His HRCT Chest on 05/14/20 showed subpleural reticulation, ground-glass, traction bronchiectasis/bronchiolectasis and probable honeycombing with upper/midlung involvement.   His PFTs 06/21/20 show moderate restrictive defect with normal DLCO.   Inflammatory markers and myositis panel are negative.  We reviewed his clinical history, HRCT chest and PFTs at multidisciplinary ILD conference in April 2022 with a recommendation to initiate anti-fibrotic therapy as his findings are concerning for pulmonary fibrosis.    He continues to experience cough with occasional clear/milky colored sputum. The nasal sprays have helped somehwat, he continues to use flonase. He has decreased his dose of pantoprazoled from 40mg  daily to 20mg  daily with no increase in his reflux symptoms.   OV 05/01/20: He reports having a cough since 2017 that has progressively worsened over the last 1 year. He does produce clear mucous at times. Has never cough up blood. He has been evaluated by GI and ENT for this cough in the past. He was started on pantoprazole over 1 year ago and he has not noticed any  improvement in his cough, which he reports has worsened over the last year. He had laryngoscopy performed by ENT 10/10/19 which was unremarkable.   He denies any shortness of breath or wheezing with the cough. He denies overt heart burn or reflux symptoms. He denies seasonal allergies, sinus congestion or drainage.   He is a never smoker. He does have exposure to [employer] as he worked for [employer]. 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    He has a nephew with cystic fibrosis.  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 Right Knee   Tinea versicolor      Family History  Problem Relation Age of Onset   Cancer Mother 35       Breast   Diabetes Mother 63       on insulin   Breast cancer Mother    Diabetes Father 84       on insulin   Bladder Cancer Father    Colon polyps Sister    Colon cancer Maternal Grandfather    Esophageal cancer Neg Hx    Rectal cancer Neg Hx    Stomach cancer Neg Hx    Prostate cancer Neg Hx      Social History   Socioeconomic History   Marital status: Married    Spouse name: Not on file   Number of children: Not on file   Years of education: Not on file   Highest education level: Not on file  Occupational History   Occupation: Insurance underwriter / Accountant  Tobacco Use   Smoking status: Never   Smokeless tobacco: Former    Types: Chew   Tobacco comments:    admits do doing chewing tabacco. 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   To [place] 2013   Remarried 2007   [employer] '71-'75   Social Determinants of Health   Financial Resource  Strain: Not on file  Food Insecurity: Not on file  Transportation Needs: Not on file  Physical Activity: Not on file  Stress: Not on file  Social Connections: Not on file  Intimate Partner Violence: Not on file     Allergies  Allergen Reactions   Penicillins Other (See Comments)    As a child     Outpatient Medications Prior to Visit  Medication Sig Dispense Refill   bisoprolol (ZEBETA) 5 MG tablet Take 0.5 tablets (2.5 mg total) by mouth daily. 45 tablet 3  COVID-19 mRNA vaccine, Moderna, 100 MCG/0.5ML injection Inject into the muscle. 0.25 mL 0   ENTRESTO 24-26 MG Take 1 tablet by mouth 2 (two) times daily. Holding until 03/10/21.     fluticasone (FLONASE) 50 MCG/ACT nasal spray SPRAY 1 SPRAY INTO BOTH NOSTRILS DAILY. 48 mL 2   HYDROcodone bit-homatropine (HYCODAN) 5-1.5 MG/5ML syrup Take 5 mLs by mouth every 6 (six) hours as needed for cough. 240 mL 0   ipratropium-albuterol (DUONEB) 0.5-2.5 (3) MG/3ML SOLN TAKE 3 MLS BY NEBULIZATION IN THE MORNING AND AT BEDTIME. 540 mL 3   Multiple Vitamin (MULTIVITAMIN) tablet Take 1 tablet by mouth daily. [facility]/ one a day men complete 50 +     Multiple Vitamins-Minerals (PRESERVISION AREDS 2+MULTI VIT PO) Take 1 tablet 2 (two) times daily by mouth.     OVER THE COUNTER MEDICATION Take 65 mg by mouth daily. CBD 1 dropper full     pantoprazole (PROTONIX) 20 MG tablet TAKE 1 TABLET BY MOUTH EVERY DAY 90 tablet 0   sildenafil (REVATIO) 20 MG tablet TAKE 3 TO 5 TABLETS BY MOUTH ONCE DAILY AS NEEDED 50 tablet 2   spironolactone (ALDACTONE) 25 MG tablet Take 0.5 tablets (12.5 mg total) by mouth daily. 15 tablet 6   terbinafine (LAMISIL) 1 % cream Apply 1 application topically daily as needed (Jock itch).     valACYclovir (VALTREX) 500 MG tablet Take 500 mg by mouth as needed (Cold sores).     ESBRIET 267 MG TABS Take 1 tab three times daily for 7 days, then 2 tabs three times daily for 7 days, then 3 tabs three times daily thereafter. Month 1 207  tablet 0   ESBRIET 267 MG TABS Take 3 tablets (801 mg total) by mouth 3 (three) times daily with meals. Month 2 and onward 270 tablet 1   losartan (COZAAR) 50 MG tablet Take 1 tablet (50 mg total) by mouth daily. (Patient not taking: Reported on 03/21/2021) 90 tablet 3   No facility-administered medications prior to visit.    Review of Systems  Constitutional:  Negative for chills, fever, malaise/fatigue and weight loss.  HENT:  Negative for congestion, sinus pain and sore throat.   Eyes: Negative.   Respiratory:  Positive for cough and sputum production. Negative for hemoptysis, shortness of breath and wheezing.   Cardiovascular:  Negative for chest pain, palpitations, orthopnea, claudication and leg swelling.  Gastrointestinal:  Negative for abdominal pain, heartburn, nausea and vomiting.  Genitourinary: Negative.   Musculoskeletal:  Negative for joint pain and myalgias.  Skin:  Negative for rash.  Neurological:  Negative for weakness.  Endo/Heme/Allergies: Negative.   Psychiatric/Behavioral: Negative.  The patient is not nervous/anxious.    Objective:   Vitals:   03/21/21 0857  BP: 118/66  Pulse: 78  SpO2: 98%  Weight: 168 lb 12.8 oz (76.6 kg)  Height: 5\' 9"  (1.753 m)     Physical Exam Constitutional:      General: He is not in acute distress. HENT:     Head: Normocephalic and atraumatic.  Eyes:     Extraocular Movements: Extraocular movements intact.     Conjunctiva/sclera: Conjunctivae normal.     Pupils: Pupils are equal, round, and reactive to light.  Cardiovascular:     Rate and Rhythm: Normal rate and regular rhythm.     Pulses: Normal pulses.     Heart sounds: Normal heart sounds. No murmur heard. Pulmonary:     Effort: Pulmonary effort is normal.  Breath sounds: Rales (bibasilar) present. No wheezing or rhonchi.  Abdominal:     General: Bowel sounds are normal.     Palpations: Abdomen is soft.  Musculoskeletal:     Right lower leg: No edema.     Left  lower leg: No edema.  Skin:    General: Skin is warm and dry.  Neurological:     General: No focal deficit present.     Mental Status: He is alert.   CBC    Component Value Date/Time   WBC 7.2 01/10/2021 1035   WBC 5.6 03/01/2020 0817   RBC 4.76 01/10/2021 1035   RBC 4.68 03/01/2020 0817   HGB 15.3 01/10/2021 1035   HCT 44.5 01/10/2021 1035   PLT 193 01/10/2021 1035   MCV 94 01/10/2021 1035   MCH 32.1 01/10/2021 1035   MCHC 34.4 01/10/2021 1035   MCHC 33.7 03/01/2020 0817   RDW 13.1 01/10/2021 1035   BMP Latest Ref Rng & Units 01/24/2021 01/10/2021 12/14/2020  Glucose 70 - 99 mg/dL 91 98 91  BUN 8 - 27 mg/dL 16 21 16   Creatinine 0.76 - 1.27 mg/dL 0.93 0.91 1.00  BUN/Creat Ratio 10 - 24 17 23 16   Sodium 134 - 144 mmol/L 139 139 139  Potassium 3.5 - 5.2 mmol/L 4.7 4.7 4.6  Chloride 96 - 106 mmol/L 100 102 101  CO2 20 - 29 mmol/L 26 29 25   Calcium 8.6 - 10.2 mg/dL 9.3 9.2 9.4   Chest imaging: HRCT Chest 05/14/20 1. Pulmonary parenchymal pattern of fibrosis may be due to nonspecific interstitial pneumonitis. Usual interstitial pneumonitis is not excluded. Findings are indeterminate for UIP per consensus guidelines: Diagnosis of Idiopathic Pulmonary Fibrosis: An Official ATS/ERS/JRS/ALAT Clinical Practice Guideline. [place], Iss 5, [phone], Dec 20 2016. 2. Small to borderline enlarged mediastinal lymph nodes can be seen in the setting of interstitial lung disease. 3. Right renal stone.  Chest Radiograph 05/04/2018 Cardiac shadow is enlarged. Pacing device is noted. The lungs are well aerated bilaterally with some minimal interstitial changes. No focal confluent infiltrate or effusion is seen. No bony abnormality is noted.  PFT: PFT Results Latest Ref Rng & Units 01/29/2021 09/10/2020 06/21/2020  FVC-Pre L 2.94 3.01 2.96  FVC-Predicted Pre % 69 70 68  FVC-Post L 2.99 2.99 2.87  FVC-Predicted Post % 70 69 66  Pre FEV1/FVC % % 94 94 93  Post  FEV1/FCV % % 96 94 95  FEV1-Pre L 2.78 2.84 2.75  FEV1-Predicted Pre % 88 89 86  FEV1-Post L 2.86 2.81 2.74  DLCO uncorrected ml/min/mmHg 18.85 26.58 22.53  DLCO UNC% % 74 105 89  DLCO corrected ml/min/mmHg 18.49 26.58 22.53  DLCO COR %Predicted % 73 105 89  DLVA Predicted % 106 136 144  TLC L 4.45 4.64 4.02  TLC % Predicted % 65 68 58  RV % Predicted % 65 67 50  PFTs 10/112022: mild restrictive defect and mild diffusion defect     Assessment & Plan:   IPF (idiopathic pulmonary fibrosis) (HCC) - Plan: CT CHEST HIGH RESOLUTION, Pulmonary Function Test  Medication monitoring encounter - Plan: Hepatic function panel  Discussion: William Curry is a 70 year old male, never smoker with with history of GERD who returns to pulmonary clinic for follow up of pulmonary fibrosis.   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  He has mild restrictive defect and a new mild diffusion defect on pulmonary function test. He  has had an increase in his cough symptoms as well since 06/2020.  He was started on esbriet therapy 02/07/21 and has tolerated it well. We will send in prescription for 801mg  tablets, 1 tablet three times daily.   He continues to experience significant cough burden. He has hycodan cough syrup PRN. We can consider a trial of gabapentin therapy in the future. He is to continue on pantoprazole 20 mg daily for GERD.  We will repeat HRCT Chest this month and PFTs at follow up in 06/2021.  Follow up in 3 months.  Freda Jackson, MD [facility] Office: [phone]   Current Outpatient Medications:    bisoprolol (ZEBETA) 5 MG tablet, Take 0.5 tablets (2.5 mg total) by mouth daily., Disp: 45 tablet, Rfl: 3   COVID-19 mRNA vaccine, Moderna, 100 MCG/0.5ML injection, Inject into the muscle., Disp: 0.25 mL, Rfl: 0   ENTRESTO 24-26 MG, Take 1 tablet by mouth 2 (two) times daily. Holding until 03/10/21., Disp: , Rfl:    fluticasone (FLONASE) 50  MCG/ACT nasal spray, SPRAY 1 SPRAY INTO BOTH NOSTRILS DAILY., Disp: 48 mL, Rfl: 2   HYDROcodone bit-homatropine (HYCODAN) 5-1.5 MG/5ML syrup, Take 5 mLs by mouth every 6 (six) hours as needed for cough., Disp: 240 mL, Rfl: 0   ipratropium-albuterol (DUONEB) 0.5-2.5 (3) MG/3ML SOLN, TAKE 3 MLS BY NEBULIZATION IN THE MORNING AND AT BEDTIME., Disp: 540 mL, Rfl: 3   Multiple Vitamin (MULTIVITAMIN) tablet, Take 1 tablet by mouth daily. [facility]/ one a day men complete 50 +, Disp: , Rfl:    Multiple Vitamins-Minerals (PRESERVISION AREDS 2+MULTI VIT PO), Take 1 tablet 2 (two) times daily by mouth., Disp: , Rfl:    OVER THE COUNTER MEDICATION, Take 65 mg by mouth daily. CBD 1 dropper full, Disp: , Rfl:    pantoprazole (PROTONIX) 20 MG tablet, TAKE 1 TABLET BY MOUTH EVERY DAY, Disp: 90 tablet, Rfl: 0   sildenafil (REVATIO) 20 MG tablet, TAKE 3 TO 5 TABLETS BY MOUTH ONCE DAILY AS NEEDED, Disp: 50 tablet, Rfl: 2   spironolactone (ALDACTONE) 25 MG tablet, Take 0.5 tablets (12.5 mg total) by mouth daily., Disp: 15 tablet, Rfl: 6   terbinafine (LAMISIL) 1 % cream, Apply 1 application topically daily as needed (Jock itch)., Disp: , Rfl:    valACYclovir (VALTREX) 500 MG tablet, Take 500 mg by mouth as needed (Cold sores)., Disp: , Rfl:    ESBRIET 801 MG TABS, Take 1 tablet by mouth 3 (three) times daily., Disp: 180 tablet, Rfl: 1   losartan (COZAAR) 50 MG tablet, Take 1 tablet (50 mg total) by mouth daily. (Patient not taking: Reported on 03/21/2021), Disp: 90 tablet, Rfl: 3

## 2021-03-21 NOTE — Patient Instructions (Addendum)
Continue Esbriet 801mg  three times per day  Check your blood pressure when you notice feeling dizzy  Continue hycodan cough syrup for your cough symptoms.   We will try to find recommendations for doctors near [place], [place]  We will check your lab work.   Follow up in 3 months with PFTs

## 2021-03-22 ENCOUNTER — Encounter: Payer: Self-pay | Admitting: Pulmonary Disease

## 2021-03-27 ENCOUNTER — Other Ambulatory Visit: Payer: Self-pay

## 2021-03-27 ENCOUNTER — Ambulatory Visit (HOSPITAL_BASED_OUTPATIENT_CLINIC_OR_DEPARTMENT_OTHER)
Admission: RE | Admit: 2021-03-27 | Discharge: 2021-03-27 | Disposition: A | Discharge status: Home / Self Care | Payer: 59 | Source: Ambulatory Visit | Attending: Pulmonary Disease | Admitting: Pulmonary Disease

## 2021-03-27 DIAGNOSIS — J84112 Idiopathic pulmonary fibrosis: Secondary | ICD-10-CM | POA: Insufficient documentation

## 2021-03-29 ENCOUNTER — Other Ambulatory Visit (HOSPITAL_COMMUNITY): Payer: Self-pay

## 2021-04-03 ENCOUNTER — Telehealth: Payer: Self-pay

## 2021-04-03 ENCOUNTER — Other Ambulatory Visit (HOSPITAL_COMMUNITY): Payer: Self-pay

## 2021-04-03 ENCOUNTER — Other Ambulatory Visit: Payer: Self-pay

## 2021-04-03 ENCOUNTER — Ambulatory Visit (HOSPITAL_COMMUNITY): Payer: 59 | Attending: Cardiology

## 2021-04-03 DIAGNOSIS — I447 Left bundle-branch block, unspecified: Secondary | ICD-10-CM | POA: Insufficient documentation

## 2021-04-03 DIAGNOSIS — J84112 Idiopathic pulmonary fibrosis: Secondary | ICD-10-CM

## 2021-04-03 DIAGNOSIS — I429 Cardiomyopathy, unspecified: Secondary | ICD-10-CM | POA: Insufficient documentation

## 2021-04-03 DIAGNOSIS — I5022 Chronic systolic (congestive) heart failure: Secondary | ICD-10-CM | POA: Insufficient documentation

## 2021-04-03 LAB — ECHOCARDIOGRAM COMPLETE
Area-P 1/2: 2.48 cm2
S' Lateral: 3.7 cm

## 2021-04-03 MED ORDER — PIRFENIDONE 267 MG PO TABS
801.0000 mg | ORAL_TABLET | Freq: Three times a day (TID) | ORAL | 2 refills | Status: DC
  Filled 2021-04-03: qty 270, 30d supply, fill #0
  Filled 2021-05-06 – 2021-05-09 (×2): qty 270, 30d supply, fill #1

## 2021-04-03 NOTE — Telephone Encounter (Signed)
Rx for 267mg  tab sent to [facility] - dose 801mg  (3 tabs) three times daily  Knox Saliva, PharmD, MPH, BCPS Clinical Pharmacist (Rheumatology and Pulmonology)

## 2021-04-03 NOTE — Telephone Encounter (Signed)
Pt's current authorization only covers the 267mg  tablets, submitting a PA request for the 801mg  tablets.  Key: [id number]

## 2021-04-16 ENCOUNTER — Ambulatory Visit (INDEPENDENT_AMBULATORY_CARE_PROVIDER_SITE_OTHER): Payer: 59

## 2021-04-16 DIAGNOSIS — I471 Supraventricular tachycardia: Secondary | ICD-10-CM

## 2021-04-16 LAB — CUP PACEART REMOTE DEVICE CHECK
Battery Remaining Longevity: 83 mo
Battery Remaining Percentage: 95.5 %
Battery Voltage: 2.99 V
Brady Statistic AP VP Percent: 17 %
Brady Statistic AP VS Percent: 1 %
Brady Statistic AS VP Percent: 83 %
Brady Statistic AS VS Percent: 1 %
Brady Statistic RA Percent Paced: 16 %
Date Time Interrogation Session: 20221227081935
Implantable Lead Implant Date: 20130115
Implantable Lead Implant Date: 20130115
Implantable Lead Implant Date: 20220926
Implantable Lead Location: 753858
Implantable Lead Location: 753859
Implantable Lead Location: 753860
Implantable Pulse Generator Implant Date: 20220926
Lead Channel Impedance Value: 1100 Ohm
Lead Channel Impedance Value: 360 Ohm
Lead Channel Impedance Value: 390 Ohm
Lead Channel Pacing Threshold Amplitude: 0.75 V
Lead Channel Pacing Threshold Amplitude: 1.25 V
Lead Channel Pacing Threshold Amplitude: 1.25 V
Lead Channel Pacing Threshold Pulse Width: 0.4 ms
Lead Channel Pacing Threshold Pulse Width: 0.6 ms
Lead Channel Pacing Threshold Pulse Width: 0.8 ms
Lead Channel Sensing Intrinsic Amplitude: 2.7 mV
Lead Channel Setting Pacing Amplitude: 1.75 V
Lead Channel Setting Pacing Amplitude: 2 V
Lead Channel Setting Pacing Amplitude: 2.5 V
Lead Channel Setting Pacing Pulse Width: 0.6 ms
Lead Channel Setting Pacing Pulse Width: 0.8 ms
Lead Channel Setting Sensing Sensitivity: 4 mV
Pulse Gen Model: 3562
Pulse Gen Serial Number: 6509372

## 2021-04-18 ENCOUNTER — Ambulatory Visit (INDEPENDENT_AMBULATORY_CARE_PROVIDER_SITE_OTHER): Payer: 59 | Admitting: Internal Medicine

## 2021-04-18 ENCOUNTER — Encounter: Payer: Self-pay | Admitting: Internal Medicine

## 2021-04-18 ENCOUNTER — Other Ambulatory Visit: Payer: Self-pay

## 2021-04-18 VITALS — BP 122/68 | HR 72 | Ht 69.0 in | Wt 165.0 lb

## 2021-04-18 DIAGNOSIS — I442 Atrioventricular block, complete: Secondary | ICD-10-CM | POA: Diagnosis not present

## 2021-04-18 DIAGNOSIS — I5022 Chronic systolic (congestive) heart failure: Secondary | ICD-10-CM

## 2021-04-18 DIAGNOSIS — I453 Trifascicular block: Secondary | ICD-10-CM | POA: Diagnosis not present

## 2021-04-18 DIAGNOSIS — I471 Supraventricular tachycardia: Secondary | ICD-10-CM

## 2021-04-18 DIAGNOSIS — Z950 Presence of cardiac pacemaker: Secondary | ICD-10-CM

## 2021-04-18 MED ORDER — ENTRESTO 24-26 MG PO TABS: 1.0000 | ORAL_TABLET | Freq: Two times a day (BID) | ORAL | 3 refills | Status: DC

## 2021-04-18 NOTE — Progress Notes (Signed)
Patient ID: William Curry, male   DOB: [birth year], 70 y.o.   MRN: [id number]      Patient Care Team: Darreld Mclean, MD as PCP - General (Family Medicine) Deboraha Sprang, MD as PCP - Electrophysiology (Cardiology)   HPI  William Curry is a 70 y.o. 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      Today,   William Curry appears well at this time and is accompanied with a family member.    William Curry states that William Curry has been fine since William Curry received his pace maker and has more energy. His adds that his breathing is about the same.  William Curry suspended the Entresto, which reduced his cough by 25%. William Curry started pirfenidone about the same time so as not sure which was responsible for the reduction in cough The patient denies chest pain, shortness of breath, nocturnal dyspnea, orthopnea, peripheral edema, lightheadedness or syncope.  Complains of cough.   William Curry is going back to [place], a little ways from [place].      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 Right Knee   Tinea versicolor     Past Surgical History:  Procedure Laterality Date   BIV UPGRADE N/A  01/14/2021   Procedure: BIV PPM UPGRADE;  Surgeon: Deboraha Sprang, MD;  Location: [facility];  Service: Cardiovascular;  Laterality: N/A;   CARTILAGE REPAIR  Nov.-1989   Left knee-HARD TO WAKE UP AFTER SX   COLONOSCOPY     KNEE ARTHROSCOPY WITH ANTERIOR CRUCIATE LIGAMENT (ACL) REPAIR  [phone]   Left knee-HARD TO WAKE UP AFTER SX   LEFT HEART CATH AND CORONARY ANGIOGRAPHY N/A 12/18/2020   Procedure: LEFT HEART CATH AND CORONARY ANGIOGRAPHY;  Surgeon: Belva Crome, MD;  Location: [facility];  Service: Cardiovascular;  Laterality: N/A;   LITHOTRIPSY  Jun-2010   Kidney stone (1st stone 1994 - Passed)   PACEMAKER INSTALLATION  Jan-2013   Complete Heart Block   SKIN BIOPSY     RIght knee for skin cancer  Current Meds  Medication Sig   bisoprolol (ZEBETA) 5 MG tablet Take 0.5 tablets (2.5 mg total) by mouth daily.   COVID-19 mRNA vaccine, Moderna, 100 MCG/0.5ML injection Inject into the muscle.   ENTRESTO 24-26 MG Take 1 tablet by mouth 2 (two) times daily. Holding until 03/10/21.   HYDROcodone bit-homatropine (HYCODAN) 5-1.5 MG/5ML syrup Take 5 mLs by mouth every 6 (six) hours as needed for cough.   ipratropium-albuterol (DUONEB) 0.5-2.5 (3) MG/3ML SOLN TAKE 3 MLS BY NEBULIZATION IN THE MORNING AND AT BEDTIME.   losartan (COZAAR) 50 MG tablet Take 1 tablet (50 mg total) by mouth daily.   Multiple Vitamin (MULTIVITAMIN) tablet Take 1 tablet by mouth daily. [facility]/ one a day men complete 50 +   Multiple Vitamins-Minerals (PRESERVISION AREDS 2+MULTI VIT PO) Take 1 tablet 2 (two) times daily by mouth.   OVER THE COUNTER MEDICATION Take 65 mg by mouth daily. CBD 1 dropper full   pantoprazole (PROTONIX) 20 MG tablet TAKE 1 TABLET BY MOUTH EVERY DAY   Pirfenidone (ESBRIET) 267 MG TABS Take 3 tablets (801 mg total) by mouth in the morning, at noon, and at bedtime.   sildenafil (REVATIO) 20 MG tablet TAKE 3 TO 5 TABLETS BY MOUTH ONCE DAILY AS NEEDED   spironolactone  (ALDACTONE) 25 MG tablet Take 0.5 tablets (12.5 mg total) by mouth daily.   terbinafine (LAMISIL) 1 % cream Apply 1 application topically daily as needed (Jock itch).   valACYclovir (VALTREX) 500 MG tablet Take 500 mg by mouth as needed (Cold sores).    Allergies  Allergen Reactions   Penicillins Other (See Comments)    As a child    Review of Systems negative except from HPI and PMH Please see the history of present illness. (+) Cough All other systems are reviewed and negative.     Physical Exam: BP 122/68    Pulse 72    Ht 5\' 9"  (1.753 m)    Wt 165 lb (74.8 kg)    SpO2 96%    BMI 24.37 kg/m  Well developed and well nourished in no acute distress HENT normal Neck supple with JVP-flat Lungs Crackles L >R  Device pocket well healed; without hematoma or erythema.  There is no tethering  Regular rate and rhythm,  Abd-soft with active BS No Clubbing cyanosis  edema Skin-warm and dry A & Oriented  Grossly normal sensory and motor function   04/18/2021 ECG: Sinus with P synchronous pacing at a rate of 72 Interval 17/13/45 interval QRS negative lead I and RS in lead V1.  11/06/2020 ECG: sinus with P-synchronous/ AV  pacing     Assessment and  Plan:  Atrial tach   Intermittent complete heart block   Trifascicular block     Pacemaker-CRT upgrade 9/22 [employer]  Pacemaker cardiomyopathy-much improved    Patient's LV function is much improved following CRT upgrade.  Nearly normal.  His Entresto has been stopped because of concern that it could be contributing to his cough; cough changed a little bit but not a lot.  We will resume it and see how William Curry does as it would be great for his recovered LV dysfunction  Continue bisoprolol 2.5 daily and spironolactone 12.5.  Euvolemic.  Continue his Aldactone.  Phrenic nerve stimulation with multiple vectors.  We are able to find a threshold 1 1 to can of less than 1 V at 0.8 ms the device was programmed at 1.2 V at 1.2 ms  William Curry is  moving  to [place] we will be available as needed    Current medicines are reviewed at length with the patient today .  The patient does not have concerns regarding medicines.    F/U in prn   I,Tinashe Williams,acting as a scribe for Virl Axe, MD.,have documented all relevant documentation on the behalf of Virl Axe, MD,as directed by  Virl Axe, MD while in the presence of Virl Axe, MD.   I, Virl Axe, MD, have reviewed all documentation for this visit. The documentation on 04/18/21 for the exam, diagnosis, procedures, and orders are all accurate and complete.

## 2021-04-18 NOTE — Patient Instructions (Signed)
Medication Instructions:  Your physician recommends that you continue on your current medications as directed. Please refer to the Current Medication list given to you today.  *If you need a refill on your cardiac medications before your next appointment, please call your pharmacy*   Lab Work: None ordered.  If you have labs (blood work) drawn today and your tests are completely normal, you will receive your results only by: [place] (if you have MyChart) OR A paper copy in the mail If you have any lab test that is abnormal or we need to change your treatment, we will call you to review the results.   Testing/Procedures: None ordered.    Follow-Up: At [facility], you and your health needs are our priority.  As part of our continuing mission to provide you with exceptional heart care, we have created designated Provider Care Teams.  These Care Teams include your primary Cardiologist (physician) and Advanced Practice Providers (APPs -  Physician Assistants and Nurse Practitioners) who all work together to provide you with the care you need, when you need it.  We recommend signing up for the patient portal called "MyChart".  Sign up information is provided on this After Visit Summary.  MyChart is used to connect with patients for Virtual Visits (Telemedicine).  Patients are able to view lab/test results, encounter notes, upcoming appointments, etc.  Non-urgent messages can be sent to your provider as well.   To learn more about what you can do with MyChart, go to [email or web address].    Your next appointment:   Follow up as needed with Dr Caryl Comes

## 2021-04-23 LAB — CUP PACEART INCLINIC DEVICE CHECK
Battery Remaining Longevity: 81 mo
Battery Voltage: 2.99 V
Brady Statistic RA Percent Paced: 16 %
Brady Statistic RV Percent Paced: 99.99 %
Date Time Interrogation Session: 20221229152600
Implantable Lead Implant Date: 20130115
Implantable Lead Implant Date: 20130115
Implantable Lead Implant Date: 20220926
Implantable Lead Location: 753858
Implantable Lead Location: 753859
Implantable Lead Location: 753860
Implantable Pulse Generator Implant Date: 20220926
Lead Channel Impedance Value: 380 Ohm
Lead Channel Impedance Value: 390 Ohm
Lead Channel Impedance Value: 562.5 Ohm
Lead Channel Pacing Threshold Amplitude: 0.5 V
Lead Channel Pacing Threshold Amplitude: 1 V
Lead Channel Pacing Threshold Amplitude: 1 V
Lead Channel Pacing Threshold Amplitude: 1.25 V
Lead Channel Pacing Threshold Pulse Width: 0.4 ms
Lead Channel Pacing Threshold Pulse Width: 0.6 ms
Lead Channel Pacing Threshold Pulse Width: 0.8 ms
Lead Channel Pacing Threshold Pulse Width: 0.8 ms
Lead Channel Sensing Intrinsic Amplitude: 2.9 mV
Lead Channel Setting Pacing Amplitude: 1 V
Lead Channel Setting Pacing Amplitude: 2 V
Lead Channel Setting Pacing Amplitude: 2.5 V
Lead Channel Setting Pacing Pulse Width: 0.6 ms
Lead Channel Setting Pacing Pulse Width: 1 ms
Lead Channel Setting Sensing Sensitivity: 4 mV
Pulse Gen Model: 3562
Pulse Gen Serial Number: 6509372

## 2021-04-26 NOTE — Progress Notes (Signed)
Remote pacemaker transmission.   

## 2021-04-29 ENCOUNTER — Other Ambulatory Visit (HOSPITAL_COMMUNITY): Payer: Self-pay

## 2021-04-30 ENCOUNTER — Other Ambulatory Visit (INDEPENDENT_AMBULATORY_CARE_PROVIDER_SITE_OTHER): Payer: 59

## 2021-04-30 ENCOUNTER — Encounter: Payer: Self-pay | Admitting: Family Medicine

## 2021-04-30 DIAGNOSIS — R972 Elevated prostate specific antigen [PSA]: Secondary | ICD-10-CM | POA: Diagnosis not present

## 2021-04-30 LAB — PSA: PSA: 2.82 ng/mL (ref 0.10–4.00)

## 2021-05-06 ENCOUNTER — Other Ambulatory Visit (HOSPITAL_COMMUNITY): Payer: Self-pay

## 2021-05-06 ENCOUNTER — Telehealth: Payer: Self-pay

## 2021-05-06 DIAGNOSIS — J84112 Idiopathic pulmonary fibrosis: Secondary | ICD-10-CM

## 2021-05-06 NOTE — Telephone Encounter (Signed)
Patient's insurance is no longer covering brand name Esbriet, only generic. However, pt is relying on a copay card in order to afford medication due to having commercial insurance.   Considering previous discussions on switching all pts to generic Pirfenidone, I conferred with Cyril Mourning at [facility] and we determined that allowing the pt to continue filling brand name Esbriet with [place] was more ideal than losing the pt altogether. I will attempt to obtain a PA approval for name brand medication.    Submitted an URGENT Prior Authorization request to [facility] for ESBRIET 801mg via CoverMyMeds. Will update once we receive a response.   Key: [id number]

## 2021-05-09 ENCOUNTER — Encounter: Payer: Self-pay | Admitting: Pulmonary Disease

## 2021-05-09 ENCOUNTER — Other Ambulatory Visit (HOSPITAL_COMMUNITY): Payer: Self-pay

## 2021-05-09 NOTE — Telephone Encounter (Signed)
Request canceled due to plan being unable to handle electronic PA requests. Automatically converted into a faxed form. Completed and submitted along with chart notes. Will await f/u.  Key: [id number]  Phone# [phone] Fax# [phone]

## 2021-05-09 NOTE — Telephone Encounter (Signed)
Please see mychart message sent by pt and advise. 

## 2021-05-10 ENCOUNTER — Other Ambulatory Visit (HOSPITAL_COMMUNITY): Payer: Self-pay

## 2021-05-10 ENCOUNTER — Ambulatory Visit: Payer: 59 | Attending: Internal Medicine

## 2021-05-10 DIAGNOSIS — Z23 Encounter for immunization: Secondary | ICD-10-CM

## 2021-05-10 NOTE — Progress Notes (Signed)
° °  Covid-19 Vaccination Clinic  Name:  William Curry    MRN: [id number] DOB: 10-Jul-1950  05/10/2021  [name] was observed post Covid-19 immunization for 15 minutes without incident. He was provided with Vaccine Information Sheet and instruction to access the V-Safe system.   [name] was instructed to call 911 with any severe reactions post vaccine: Difficulty breathing  Swelling of face and throat  A fast heartbeat  A bad rash all over body  Dizziness and weakness   Immunizations Administered     Name Date Dose VIS Date Route   Moderna Covid-19 vaccine Bivalent Booster 05/10/2021  2:34 PM 0.5 mL 12/01/2020 Intramuscular   Manufacturer: Levan Hurst   Lot: [id number]   [place]: [id number]

## 2021-05-10 NOTE — Telephone Encounter (Signed)
Called patient and advised we are working to get brand Esbriet covered since copay card works only for brand. He verbalized understanding.  He states that he is planning to move to [place] in April 2023 and I reviewed that he will have to transition to a larger specialty pharmacy once he moved. He verbalized understanding.  Knox Saliva, PharmD, MPH, BCPS Clinical Pharmacist (Rheumatology and Pulmonology)

## 2021-05-10 NOTE — Telephone Encounter (Addendum)
Generic pirfenidone (SANDOZ manufacturer) has copay card. Confirmed that copay card reduces to no copay for patient  BIN: [id number] Group: [id number] ID: [id number]  I've reached out to [facility] to confirm this manufacturer can be ordered.  Knox Saliva, PharmD, MPH, BCPS Clinical Pharmacist (Rheumatology and Pulmonology)

## 2021-05-13 ENCOUNTER — Other Ambulatory Visit (HOSPITAL_COMMUNITY): Payer: Self-pay

## 2021-05-13 ENCOUNTER — Other Ambulatory Visit (HOSPITAL_BASED_OUTPATIENT_CLINIC_OR_DEPARTMENT_OTHER): Payer: Self-pay

## 2021-05-13 MED ORDER — PIRFENIDONE 267 MG PO TABS
801.0000 mg | ORAL_TABLET | Freq: Three times a day (TID) | ORAL | 2 refills | Status: DC
  Filled 2021-05-13: qty 270, 30d supply, fill #0

## 2021-05-13 MED ORDER — MODERNA COVID-19 BIVAL BOOSTER 50 MCG/0.5ML IM SUSP
INTRAMUSCULAR | 0 refills | Status: DC
  Filled 2021-05-13: qty 0.5, 1d supply, fill #0

## 2021-05-13 NOTE — Telephone Encounter (Signed)
Received confirmation that generic pirfenidone of Sandoz manufacturer can be ordered. Rx sent to [facility] for generic with copay card information  Knox Saliva, PharmD, MPH, BCPS Clinical Pharmacist (Rheumatology and Pulmonology)

## 2021-05-14 ENCOUNTER — Other Ambulatory Visit (HOSPITAL_COMMUNITY): Payer: Self-pay

## 2021-05-14 MED ORDER — PIRFENIDONE 801 MG PO TABS
801.0000 mg | ORAL_TABLET | Freq: Three times a day (TID) | ORAL | 1 refills | Status: DC
  Filled 2021-05-14: qty 270, fill #0
  Filled 2021-05-20: qty 90, 30d supply, fill #0
  Filled 2021-06-07: qty 90, 30d supply, fill #1
  Filled 2021-07-09: qty 90, 30d supply, fill #2
  Filled 2021-08-06: qty 90, 30d supply, fill #3

## 2021-05-14 NOTE — Addendum Note (Signed)
Addended by: Cassandria Anger on: 05/14/2021 01:12 PM   Modules accepted: Orders

## 2021-05-14 NOTE — Telephone Encounter (Signed)
Rx for pirfenidone 801mg  tablets sent to [facility].  Knox Saliva, PharmD, MPH, BCPS Clinical Pharmacist (Rheumatology and Pulmonology)

## 2021-05-15 ENCOUNTER — Ambulatory Visit (INDEPENDENT_AMBULATORY_CARE_PROVIDER_SITE_OTHER): Payer: 59 | Admitting: Gastroenterology

## 2021-05-15 ENCOUNTER — Other Ambulatory Visit (INDEPENDENT_AMBULATORY_CARE_PROVIDER_SITE_OTHER): Payer: 59

## 2021-05-15 ENCOUNTER — Encounter: Payer: Self-pay | Admitting: Gastroenterology

## 2021-05-15 VITALS — BP 112/58 | HR 78 | Ht 69.0 in | Wt 163.0 lb

## 2021-05-15 DIAGNOSIS — R053 Chronic cough: Secondary | ICD-10-CM | POA: Diagnosis not present

## 2021-05-15 DIAGNOSIS — K219 Gastro-esophageal reflux disease without esophagitis: Secondary | ICD-10-CM

## 2021-05-15 LAB — VITAMIN D 25 HYDROXY (VIT D DEFICIENCY, FRACTURES): VITD: 100.06 ng/mL — ABNORMAL HIGH (ref 30.00–100.00)

## 2021-05-15 LAB — IRON, TOTAL/TOTAL IRON BINDING CAP
%SAT: 38 % (calc) (ref 20–48)
Iron: 102 ug/dL (ref 50–180)
TIBC: 266 mcg/dL (calc) (ref 250–425)

## 2021-05-15 LAB — FOLATE: Folate: >24.2 ng/mL (ref >5.9)

## 2021-05-15 LAB — FERRITIN: Ferritin: 169.2 ng/mL (ref 22.0–322.0)

## 2021-05-15 LAB — VITAMIN B12: Vitamin B-12: 492 pg/mL (ref 211–911)

## 2021-05-15 NOTE — Patient Instructions (Signed)
If you are age 71 or older, your body mass index should be between 23-30. Your Body mass index is 24.07 kg/m. If this is out of the aforementioned range listed, please consider follow up with your Primary Care Provider.  If you are age 80 or younger, your body mass index should be between 19-25. Your Body mass index is 24.07 kg/m. If this is out of the aformentioned range listed, please consider follow up with your Primary Care Provider.   __________________________________________________________  The [facility] providers would like to encourage you to use [facility] to communicate with providers for non-urgent requests or questions.  Due to long hold times on the telephone, sending your provider a message by [facility] may be a faster and more efficient way to get a response.  Please allow 48 business hours for a response.  Please remember that this is for non-urgent requests.    Please go to the lab on the 2nd floor [address] before you leave the office today.    Due to recent changes in healthcare laws, you may see the results of your imaging and laboratory studies on MyChart before your provider has had a chance to review them.  We understand that in some cases there may be results that are confusing or concerning to you. Not all laboratory results come back in the same time frame and the provider may be waiting for multiple results in order to interpret others.  Please give [place] 48 hours in order for your provider to thoroughly review all the results before contacting the office for clarification of your results.   Thank you for choosing me and [facility].  Vito Cirigliano, D.O.

## 2021-05-15 NOTE — Progress Notes (Signed)
Chief Complaint:    GERD, chronic cough, medication counseling  GI History: Larico Dimock is a 71 y.o. 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   Longstanding history of reflux for 20 years or so, characterized as dry cough predominantly, with occasional regurgitation and dyspepsia.  No dysphagia.  Rare globus. No nocturnal sxs. No inciting foods.  Symptoms started increasing in frequency/severity approximately 2018.   Initially treated with Tums with improvement. Sxs worsened 10 years ago and started OTC Prilosec and Zantac on/off, again responsive to therapy.  Started on Protonix 40 mg bid x4 weeks in 2020, with good clinical response, and titrated to 40 mg/day with continued response.   Endoscopic history: -EGD (02/2019, Dr. Bryan Lemma): Candida esophagus, normal Z-line, mild gastritis, hyperplastic gastric polyps -Colonoscopy (2014, Dr. Sharlett Iles): Normal.  Repeat in 10 years  HPI:     Patient is a 71 y.o. male presenting to the Gastroenterology Clinic for follow-up.  Last seen by me on 09/07/2019.  At that time, typical reflux symptoms of heartburn and regurgitation were well controlled with Protonix, but still with intermittent dry cough.  Cough worse with reclining or supine.  Had recommended ENT referral and if unrevealing, pulmonary referral.  Was seen in the Pulmonary Clinic and started on fluticasone with some improvement.  Patient reduced pantoprazole to 20 mg/day in 05/2020.  Had stopped fluticasone due to epistaxis.  Held [place] and started Esbriet therapy 01/2021 with 25% improvement in cough.  Last seen in the Pulmonary Clinic 03/21/2021.  Plan for 51-month follow-up with repeat HRCT Chest and PFTs.  Follows in the Cardiology Clinic, last seen on 04/18/2021.  Restarted Entresto at that time.  Today, he states he would like to stop the PPI since his cough  has improved since modifying pulmonary fibrosis meds.  No recent heartburn or regurgitation.  No dysphagia.  Planning on moving to [place], [place] in 08/2021.   Review of systems:     No chest pain, no SOB, no fevers, no urinary sx   Past Medical History:  Diagnosis Date   DDD (degenerative disc disease), lumbar    GERD (gastroesophageal reflux disease)    Heart block AV complete (Levan) 2013   Pacemaker   History of chicken pox    Hypertension 2002-2005   Kidney stone 1994-2010   Passed stone in 1994, Lithotripsy in 2010, stones passed in 2018   Macular degeneration    Scoliosis    Skin cancer    Squamous cell carcomina. Right Knee   Tinea versicolor     Patient's surgical history, family medical history, social history, medications and allergies were all reviewed in Epic    Current Outpatient Medications  Medication Sig Dispense Refill   bisoprolol (ZEBETA) 5 MG tablet Take 0.5 tablets (2.5 mg total) by mouth daily. 45 tablet 3   ENTRESTO 24-26 MG Take 1 tablet by mouth 2 (two) times daily. 180 tablet 3   HYDROcodone bit-homatropine (HYCODAN) 5-1.5 MG/5ML syrup Take 5 mLs by mouth every 6 (six) hours as needed for cough. 240 mL 0   ipratropium-albuterol (DUONEB) 0.5-2.5 (3) MG/3ML SOLN TAKE 3 MLS BY NEBULIZATION IN THE MORNING AND AT BEDTIME. 540 mL 3   Multiple Vitamin (MULTIVITAMIN) tablet Take 1 tablet by mouth daily. [facility]/ one a day men complete 50 +     Multiple Vitamins-Minerals (PRESERVISION AREDS 2+MULTI VIT PO) Take 1 tablet 2 (two) times  daily by mouth.     OVER THE COUNTER MEDICATION Take 65 mg by mouth daily. CBD 1 dropper full     pantoprazole (PROTONIX) 20 MG tablet TAKE 1 TABLET BY MOUTH EVERY DAY 90 tablet 0   Pirfenidone (ESBRIET) 801 MG TABS Take 1 tablet (801 mg) by mouth 3 (three) times daily with meals. 270 tablet 1   sildenafil (REVATIO) 20 MG tablet TAKE 3 TO 5 TABLETS BY MOUTH ONCE DAILY AS NEEDED 50 tablet 2   spironolactone (ALDACTONE) 25 MG  tablet Take 0.5 tablets (12.5 mg total) by mouth daily. 15 tablet 6   terbinafine (LAMISIL) 1 % cream Apply 1 application topically daily as needed (Jock itch).     valACYclovir (VALTREX) 500 MG tablet Take 500 mg by mouth as needed (Cold sores).     No current facility-administered medications for this visit.    Physical Exam:     BP (!) 112/58    Pulse 78    Ht 5\' 9"  (1.753 m)    Wt 163 lb (73.9 kg)    BMI 24.07 kg/m   GENERAL:  Pleasant male in NAD PSYCH: : Cooperative, normal affect Musculoskeletal:  Normal muscle tone, normal strength NEURO: Alert and oriented x 3, no focal neurologic deficits   IMPRESSION and PLAN:    1) Chronic cough 2) GERD - Cough improving with medication adjustment in the Pulmonary and Cardiology clinics - Patient would very much like to stop acid suppression therapy if possible.  Plan to reduce Protonix to QOD x2 weeks then stop.  If breakthrough reflux symptoms or increasing cough, will go back to daily dosing - Check micronutrients given chronic PPI: Vit D, B12, iron panel -Continue medications per Pulmonary and Cardiology clinics - As previously discussed, if ever concern for uncontrolled reflux causing cough, next step would be transnasal pH/impedance testing or Bravo testing (on PPI) -Patient planning on moving to [place] in a couple of months.  Happy to assist in providing records when he establishes GI care in [place].  Can otherwise follow-up with me as needed in the interim     3) Colon cancer screening - Due for repeat colonoscopy in 2024 for ongoing [place] screening      [place] ,DO, FACG 05/15/2021, 10:05 AM

## 2021-05-16 ENCOUNTER — Telehealth: Payer: Self-pay | Admitting: General Surgery

## 2021-05-16 NOTE — Telephone Encounter (Signed)
-----   Message from [place], DO sent at 05/15/2021  5:27 PM EST ----- Labs notable for very elevated vitamin D.  Please stop taking any vitamin D deficiency.  Otherwise normal folate, B12, iron panel.

## 2021-05-16 NOTE — Telephone Encounter (Signed)
Left a voicemail on patients home and mobile. Will send mychart message that iron labs were normal and vit d is high, he needs to stop his vitamin d supplement

## 2021-05-17 ENCOUNTER — Encounter: Payer: Self-pay | Admitting: Pulmonary Disease

## 2021-05-20 ENCOUNTER — Other Ambulatory Visit (HOSPITAL_COMMUNITY): Payer: Self-pay

## 2021-05-20 NOTE — Telephone Encounter (Signed)
Sent email to [facility] to initiate and fill patient's pirfenidone for generic Architectural technologist) so copay card on file can be used.  Will f/u to ensure completed  Knox Saliva, PharmD, MPH, BCPS Clinical Pharmacist (Rheumatology and Pulmonology)

## 2021-05-21 ENCOUNTER — Other Ambulatory Visit (HOSPITAL_COMMUNITY): Payer: Self-pay

## 2021-05-21 NOTE — Telephone Encounter (Signed)
Medication shipped on 05/20/21

## 2021-05-21 NOTE — Telephone Encounter (Signed)
Called patient. He will be receiving generic pirfenidone tomorrow in mail. He has been advised to start at prior dose of 801mg  (1 tablet now) three times daily. Reviewed that re-titration is only warranted after being off of medication for >2 weeks.  He verbalized understanding.  Knox Saliva, PharmD, MPH, BCPS Clinical Pharmacist (Rheumatology and Pulmonology)

## 2021-05-21 NOTE — Telephone Encounter (Signed)
Zero copay

## 2021-05-21 NOTE — Telephone Encounter (Signed)
Dr. Erin Fulling, please see pt's latest email response. He has been without Esbriet for 11 days. Pt is questioning how he should resume taking it. Thanks.

## 2021-05-22 ENCOUNTER — Other Ambulatory Visit (HOSPITAL_COMMUNITY): Payer: Self-pay

## 2021-05-23 ENCOUNTER — Other Ambulatory Visit (HOSPITAL_COMMUNITY): Payer: Self-pay

## 2021-06-07 ENCOUNTER — Other Ambulatory Visit (HOSPITAL_COMMUNITY): Payer: Self-pay

## 2021-06-14 ENCOUNTER — Other Ambulatory Visit (HOSPITAL_COMMUNITY): Payer: Self-pay

## 2021-06-20 ENCOUNTER — Ambulatory Visit: Payer: 59 | Admitting: Pulmonary Disease

## 2021-06-20 ENCOUNTER — Other Ambulatory Visit: Payer: Self-pay

## 2021-06-20 ENCOUNTER — Ambulatory Visit (INDEPENDENT_AMBULATORY_CARE_PROVIDER_SITE_OTHER): Payer: 59 | Admitting: Pulmonary Disease

## 2021-06-20 ENCOUNTER — Encounter: Payer: Self-pay | Admitting: Pulmonary Disease

## 2021-06-20 VITALS — BP 118/68 | HR 71 | Ht 69.0 in | Wt 160.4 lb

## 2021-06-20 DIAGNOSIS — J84112 Idiopathic pulmonary fibrosis: Secondary | ICD-10-CM

## 2021-06-20 DIAGNOSIS — R053 Chronic cough: Secondary | ICD-10-CM

## 2021-06-20 LAB — PULMONARY FUNCTION TEST
DL/VA % pred: 105 %
DL/VA: 4.31 ml/min/mmHg/L
DLCO cor % pred: 78 %
DLCO cor: 19.67 ml/min/mmHg
DLCO unc % pred: 78 %
DLCO unc: 19.67 ml/min/mmHg
FEF 25-75 Post: 7.16 L/sec
FEF 25-75 Pre: 6.29 L/sec
FEF2575-%Change-Post: 13 %
FEF2575-%Pred-Post: 300 %
FEF2575-%Pred-Pre: 264 %
FEV1-%Change-Post: 2 %
FEV1-%Pred-Post: 94 %
FEV1-%Pred-Pre: 92 %
FEV1-Post: 2.95 L
FEV1-Pre: 2.89 L
FEV1FVC-%Change-Post: 0 %
FEV1FVC-%Pred-Pre: 126 %
FEV6-%Change-Post: 2 %
FEV6-%Pred-Post: 77 %
FEV6-%Pred-Pre: 76 %
FEV6-Post: 3.12 L
FEV6-Pre: 3.06 L
FEV6FVC-%Pred-Post: 106 %
FEV6FVC-%Pred-Pre: 106 %
FVC-%Change-Post: 1 %
FVC-%Pred-Post: 73 %
FVC-%Pred-Pre: 72 %
FVC-Post: 3.14 L
FVC-Pre: 3.09 L
Post FEV1/FVC ratio: 94 %
Post FEV6/FVC ratio: 100 %
Pre FEV1/FVC ratio: 93 %
Pre FEV6/FVC Ratio: 100 %
RV % pred: 63 %
RV: 1.52 L
TLC % pred: 67 %
TLC: 4.6 L

## 2021-06-20 MED ORDER — IPRATROPIUM BROMIDE 0.03 % NA SOLN: 2.0000 | Freq: Two times a day (BID) | NASAL | 12 refills | Status: DC

## 2021-06-20 NOTE — Patient Instructions (Addendum)
Can try using 7.31mL to 36mL of the hycodan cough syrup for cough suppression if 47mL is not enough.  ? ?I will talk with the [facility] group about the cough clinical trial ? ?Your pulmonary function tests and CT Chest scan is stable at this time.  ? ?Use ipratropium nasal spray, 2 sprays per nostril twice daily as needed.  ? ?Follow up in 6 months.  ?

## 2021-06-20 NOTE — Progress Notes (Signed)
Synopsis: Referred in 04/2020 for chronic cough by Dr. Lamar Blinks, MD  Subjective:   PATIENT ID: William Curry GENDER: male DOB: [birth year], MRN: [id number]  HPI  Chief Complaint  Patient presents with   Follow-up    F/U after PFT. States he has been doing well since last visit. Still on Esbriet 801mg  and denies any side effects.    William Curry is a 71 year old male, never smoker with with history of GERD who returns to pulmonary clinic for follow up of pulmonary fibrosis.   He continues to do well on esbriet. He reports a 7lbs weight loss since January due to lack of appetite. He reports his cough is worse since restarting the entresto.  He tried flonase for sinus drainage with no improvement.  They are not planning to move to [place] for another year now.  PFTs today remain stable.  OV 03/21/21 He started with Esbriet therapy on 02/07/2021 and has not tolerated increasing to 3 tabs 3 times per day.  He does report dizziness 30 minutes after taking Esbriet.  He has not checked his blood pressure during these times.  He has been holding losartan or Entresto.  He also reports developing some itchy areas of skin since starting esbriet. He is using CeraVe cream which helps.   He reports his cough is about 25% improved since last visit as we discussed holding Entresto for possible contribution to his cough symptoms.  He has been holding his fluticasone nasal spray due to issues with epistaxis.  He and his wife are planning to move back to [place], [place] in May 2023.  OV 01/29/21 He reports his cough has progressed since last visit. Remains dry with occasional sputum production. He was started on entresto in August. He was trying menthol cough candies without relief.  He had repeat PFTs today which show a decline in DLCO to 18.49 from 22 and 26 previously.   OV 10/18/20 Repeat PFTs on 09/10/20 showed stability in his lung function with mild restrictive defect. DLCO remains  normal.   He has been approved for esbriet and has meet with our pharmacy team about the benefits and risks of the medication as outlined in there meeting on 09/10/20.  He wishes to currently hold off on this therapy at this time until later this fall as he wants to avoid any of the side effects this summer.  He has had an increase in cough symptoms with sputum production since last visit.  He attributes this to his recent travels to the [facility] and changes in the climate from cold to hot weather.  He denies any shortness of breath.  His wife has accompanied him on today's visit.  He brought a copy of his mother's death certificate which had idiopathic pulmonary fibrosis listed as the cause of death.  He reports she was a very heavy smoker.  OV 08/27/2020: His HRCT Chest on 05/14/20 showed subpleural reticulation, ground-glass, traction bronchiectasis/bronchiolectasis and probable honeycombing with upper/midlung involvement.   His PFTs 06/21/20 show moderate restrictive defect with normal DLCO.   Inflammatory markers and myositis panel are negative.  We reviewed his clinical history, HRCT chest and PFTs at multidisciplinary ILD conference in April 2022 with a recommendation to initiate anti-fibrotic therapy as his findings are concerning for pulmonary fibrosis.    He continues to experience cough with occasional clear/milky colored sputum. The nasal sprays have helped somehwat, he continues to use flonase. He has decreased his dose of pantoprazoled from  40mg  daily to 20mg  daily with no increase in his reflux symptoms.   OV 05/01/20: He reports having a cough since 2017 that has progressively worsened over the last 1 year. He does produce clear mucous at times. Has never cough up blood. He has been evaluated by GI and ENT for this cough in the past. He was started on pantoprazole over 1 year ago and he has not noticed any improvement in his cough, which he reports has worsened over the last  year. He had laryngoscopy performed by ENT 10/10/19 which was unremarkable.   He denies any shortness of breath or wheezing with the cough. He denies overt heart burn or reflux symptoms. He denies seasonal allergies, sinus congestion or drainage.   He is a never smoker. He does have exposure to [employer] as he worked for [employer]. 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    He has a nephew with cystic fibrosis.  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 Right Knee   Tinea versicolor      Family History  Problem Relation Age of Onset   Cancer Mother 18       Breast   Diabetes Mother 56       on insulin   Breast cancer Mother    Diabetes Father 32       on insulin   Bladder Cancer Father    Colon polyps Sister    Colon cancer Maternal Grandfather    Esophageal cancer Neg Hx    Rectal cancer Neg Hx    Stomach cancer Neg Hx    Prostate cancer Neg Hx      Social History   Socioeconomic History   Marital status: Married    Spouse name: Not on file   Number of children: Not on file   Years of education: Not on file   Highest education level: Not on file  Occupational History   Occupation: Insurance underwriter / Accountant  Tobacco Use   Smoking status: Never   Smokeless tobacco: Former    Types: Chew   Tobacco comments:    admits do doing chewing tabacco. stopped in 2005  Vaping Use   Vaping Use: Never used  Substance and Sexual Activity   Alcohol use: Yes    Alcohol/week: 6.0 standard drinks     Types: 6 Cans of beer per week    Comment: 4 - 6 beers a week   Drug use: No   Sexual activity: Not on file  Other Topics Concern   Not on file  Social History Narrative   Formerly with [employer], prev did some accounting work   To [place] 2013   Remarried 2007   [employer] '71-'75   Social Determinants of Health   Financial Resource Strain: Not on file  Food Insecurity: Not on file  Transportation Needs: Not on file  Physical Activity: Not on  file  Stress: Not on file  Social Connections: Not on file  Intimate Partner Violence: Not on file     Allergies  Allergen Reactions   Penicillins Other (See Comments)    As a child     Outpatient Medications Prior to Visit  Medication Sig Dispense Refill   bisoprolol (ZEBETA) 5 MG tablet Take 0.5 tablets (2.5 mg total) by mouth daily. 45 tablet 3   ENTRESTO 24-26 MG Take 1 tablet by mouth 2 (two) times daily. 180 tablet 3   HYDROcodone bit-homatropine (HYCODAN) 5-1.5 MG/5ML syrup Take 5 mLs by mouth every 6 (six) hours as needed for cough. 240 mL 0   ipratropium-albuterol (DUONEB) 0.5-2.5 (3) MG/3ML SOLN TAKE 3 MLS BY NEBULIZATION IN THE MORNING AND AT BEDTIME. 540 mL 3   Multiple Vitamin (MULTIVITAMIN) tablet Take 1 tablet by mouth daily. [facility]/ one a day men complete 50 +     Multiple Vitamins-Minerals (PRESERVISION AREDS 2+MULTI VIT PO) Take 1 tablet 2 (two) times daily by mouth.     omeprazole (PRILOSEC OTC) 20 MG tablet Take 20 mg by mouth daily.     OVER THE COUNTER MEDICATION Take 65 mg by mouth daily. CBD 1 dropper full     Pirfenidone (ESBRIET) 801 MG TABS Take 1 tablet (801 mg) by mouth 3 (three) times daily with meals. 270 tablet 1   sildenafil (REVATIO) 20 MG tablet TAKE 3 TO 5 TABLETS BY MOUTH ONCE DAILY AS NEEDED 50 tablet 2   spironolactone (ALDACTONE) 25 MG tablet Take 0.5 tablets (12.5 mg total) by mouth daily. 15 tablet 6   terbinafine (LAMISIL) 1 % cream Apply 1 application topically daily as  needed (Jock itch).     valACYclovir (VALTREX) 500 MG tablet Take 500 mg by mouth as needed (Cold sores).     pantoprazole (PROTONIX) 20 MG tablet TAKE 1 TABLET BY MOUTH EVERY DAY 90 tablet 0   No facility-administered medications prior to visit.    Review of Systems  Constitutional:  Negative for chills, fever, malaise/fatigue and weight loss.  HENT:  Negative for congestion, sinus pain and sore throat.   Eyes: Negative.   Respiratory:  Positive for cough and sputum production. Negative for hemoptysis, shortness of breath and wheezing.   Cardiovascular:  Negative for chest pain, palpitations, orthopnea, claudication and leg swelling.  Gastrointestinal:  Negative for abdominal pain, heartburn, nausea and vomiting.  Genitourinary: Negative.   Musculoskeletal:  Negative for joint pain and myalgias.  Skin:  Negative for rash.  Neurological:  Negative for weakness.  Endo/Heme/Allergies: Negative.   Psychiatric/Behavioral: Negative.  The patient is not nervous/anxious.    Objective:   Vitals:   06/20/21 0951  BP: 118/68  Pulse: 71  SpO2: 96%  Weight: 160 lb 6.4 oz (72.8 kg)  Height: 5\' 9"  (1.753 m)   Physical Exam Constitutional:      General: He is not in acute distress. HENT:     Head: Normocephalic and atraumatic.  Eyes:     Conjunctiva/sclera: Conjunctivae normal.  Cardiovascular:     Rate and Rhythm: Normal rate and regular rhythm.     Pulses: Normal pulses.     Heart sounds: Normal heart sounds. No murmur heard. Pulmonary:     Effort: Pulmonary effort is normal.     Breath sounds: Rales (bibasilar) present. No wheezing or rhonchi.  Musculoskeletal:     Right lower leg: No edema.     Left lower leg: No edema.  Skin:    General:  Skin is warm and dry.  Neurological:     General: No focal deficit present.     Mental Status: He is alert.   CBC    Component Value Date/Time   WBC 7.2 01/10/2021 1035   WBC 5.6 03/01/2020 0817   RBC 4.76 01/10/2021 1035   RBC 4.68  03/01/2020 0817   HGB 15.3 01/10/2021 1035   HCT 44.5 01/10/2021 1035   PLT 193 01/10/2021 1035   MCV 94 01/10/2021 1035   MCH 32.1 01/10/2021 1035   MCHC 34.4 01/10/2021 1035   MCHC 33.7 03/01/2020 0817   RDW 13.1 01/10/2021 1035   BMP Latest Ref Rng & Units 01/24/2021 01/10/2021 12/14/2020  Glucose 70 - 99 mg/dL 91 98 91  BUN 8 - 27 mg/dL 16 21 16   Creatinine 0.76 - 1.27 mg/dL 0.93 0.91 1.00  BUN/Creat Ratio 10 - 24 17 23 16   Sodium 134 - 144 mmol/L 139 139 139  Potassium 3.5 - 5.2 mmol/L 4.7 4.7 4.6  Chloride 96 - 106 mmol/L 100 102 101  CO2 20 - 29 mmol/L 26 29 25   Calcium 8.6 - 10.2 mg/dL 9.3 9.2 9.4   Chest imaging: HRCT Chest 03/27/21 1. No significant change in moderate pulmonary fibrosis in a pattern without clear apical to basal gradient featuring irregular peripheral interstitial opacity, septal thickening, and areas of subpleural bronchiolectasis throughout. Mild varicoid and traction bronchiectasis. Findings are categorized as probable UIP per consensus guidelines: Diagnosis of Idiopathic Pulmonary Fibrosis: An Official ATS/ERS/JRS/ALAT Clinical Practice Guideline. [place], Iss 5, [phone], Dec 20 2016. 2. Unchanged prominent mediastinal and bilateral hilar lymph nodes, some of which are calcified, nonspecific although likely reactive to fibrosis. Calcification may be sequelae of prior granulomatous infection. 3. Cardiomegaly. 4. Enlargement of the main pulmonary artery, as can be seen in pulmonary hypertension.  HRCT Chest 05/14/20 1. Pulmonary parenchymal pattern of fibrosis may be due to nonspecific interstitial pneumonitis. Usual interstitial pneumonitis is not excluded. Findings are indeterminate for UIP per consensus guidelines: Diagnosis of Idiopathic Pulmonary Fibrosis: An Official ATS/ERS/JRS/ALAT Clinical Practice Guideline. [place], Iss 5, [phone], Dec 20 2016. 2. Small to borderline enlarged  mediastinal lymph nodes can be seen in the setting of interstitial lung disease. 3. Right renal stone.  Chest Radiograph 05/04/2018 Cardiac shadow is enlarged. Pacing device is noted. The lungs are well aerated bilaterally with some minimal interstitial changes. No focal confluent infiltrate or effusion is seen. No bony abnormality is noted.  PFT: PFT Results Latest Ref Rng & Units 06/20/2021 01/29/2021 09/10/2020 06/21/2020  FVC-Pre L 3.09 2.94 3.01 2.96  FVC-Predicted Pre % 72 69 70 68  FVC-Post L 3.14 2.99 2.99 2.87  FVC-Predicted Post % 73 70 69 66  Pre FEV1/FVC % % 93 94 94 93  Post FEV1/FCV % % 94 96 94 95  FEV1-Pre L 2.89 2.78 2.84 2.75  FEV1-Predicted Pre % 92 88 89 86  FEV1-Post L 2.95 2.86 2.81 2.74  DLCO uncorrected ml/min/mmHg 19.67 18.85 26.58 22.53  DLCO UNC% % 78 74 105 89  DLCO corrected ml/min/mmHg 19.67 18.49 26.58 22.53  DLCO COR %Predicted % 78 73 105 89  DLVA Predicted % 105 106 136 144  TLC L 4.60 4.45 4.64 4.02  TLC % Predicted % 67 65 68 58  RV % Predicted % 63 65 67 50  PFTs 10/112022: mild restrictive defect and mild diffusion defect     Assessment & Plan:  IPF (idiopathic pulmonary fibrosis) (HCC)  Chronic cough - Plan: ipratropium (ATROVENT) 0.03 % nasal spray  Discussion: William Curry is a 71 year old male, never smoker with with history of GERD who returns to pulmonary clinic for follow up of pulmonary fibrosis.   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  He has mild restrictive defect and normalized DLCO on recent PFTs. He is to continue esbriet 801mg  3 times daily.  Cough continues to be his main issue at this time. We will try ipratropium nasal spray for post-nasal drip leading to cough. I instrcuted him to try 7.72mL of the hycodan cough syrup or 46mL and monitor for any improvement in the cough as 59mL does not seem to help.   He is interested in becoming part of the chronic cough clinical trial offered by [facility]. We  will have their team reach out to him.   Follow up in 6 months.  Freda Jackson, MD [facility] Office: [phone]   Current Outpatient Medications:    bisoprolol (ZEBETA) 5 MG tablet, Take 0.5 tablets (2.5 mg total) by mouth daily., Disp: 45 tablet, Rfl: 3   ENTRESTO 24-26 MG, Take 1 tablet by mouth 2 (two) times daily., Disp: 180 tablet, Rfl: 3   HYDROcodone bit-homatropine (HYCODAN) 5-1.5 MG/5ML syrup, Take 5 mLs by mouth every 6 (six) hours as needed for cough., Disp: 240 mL, Rfl: 0   ipratropium (ATROVENT) 0.03 % nasal spray, Place 2 sprays into both nostrils every 12 (twelve) hours., Disp: 30 mL, Rfl: 12   ipratropium-albuterol (DUONEB) 0.5-2.5 (3) MG/3ML SOLN, TAKE 3 MLS BY NEBULIZATION IN THE MORNING AND AT BEDTIME., Disp: 540 mL, Rfl: 3   Multiple Vitamin (MULTIVITAMIN) tablet, Take 1 tablet by mouth daily. [facility]/ one a day men complete 50 +, Disp: , Rfl:    Multiple Vitamins-Minerals (PRESERVISION AREDS 2+MULTI VIT PO), Take 1 tablet 2 (two) times daily by mouth., Disp: , Rfl:    omeprazole (PRILOSEC OTC) 20 MG tablet, Take 20 mg by mouth daily., Disp: , Rfl:    OVER THE COUNTER MEDICATION, Take 65 mg by mouth daily. CBD 1 dropper full, Disp: , Rfl:    Pirfenidone (ESBRIET) 801 MG TABS, Take 1 tablet (801 mg) by mouth 3 (three) times daily with meals., Disp: 270 tablet, Rfl: 1   sildenafil (REVATIO) 20 MG tablet, TAKE 3 TO 5 TABLETS BY MOUTH ONCE DAILY AS NEEDED, Disp: 50 tablet, Rfl: 2   spironolactone (ALDACTONE) 25 MG tablet, Take 0.5 tablets (12.5 mg total) by mouth daily., Disp: 15 tablet, Rfl: 6   terbinafine (LAMISIL) 1 % cream, Apply 1 application topically daily as needed (Jock itch)., Disp: , Rfl:    valACYclovir (VALTREX) 500 MG tablet, Take 500 mg by mouth as needed (Cold sores)., Disp: , Rfl:

## 2021-06-20 NOTE — Progress Notes (Signed)
PFT done today. 

## 2021-06-21 ENCOUNTER — Encounter: Payer: Self-pay | Admitting: Pulmonary Disease

## 2021-06-27 ENCOUNTER — Other Ambulatory Visit: Payer: Self-pay

## 2021-06-27 ENCOUNTER — Other Ambulatory Visit: Payer: Self-pay | Admitting: Physician Assistant

## 2021-07-09 ENCOUNTER — Other Ambulatory Visit (HOSPITAL_COMMUNITY): Payer: Self-pay

## 2021-07-11 ENCOUNTER — Other Ambulatory Visit (HOSPITAL_COMMUNITY): Payer: Self-pay

## 2021-07-16 ENCOUNTER — Encounter: Payer: 59 | Admitting: *Deleted

## 2021-07-16 ENCOUNTER — Ambulatory Visit (INDEPENDENT_AMBULATORY_CARE_PROVIDER_SITE_OTHER): Payer: 59

## 2021-07-16 ENCOUNTER — Other Ambulatory Visit: Payer: Self-pay

## 2021-07-16 DIAGNOSIS — I442 Atrioventricular block, complete: Secondary | ICD-10-CM

## 2021-07-16 DIAGNOSIS — J84112 Idiopathic pulmonary fibrosis: Secondary | ICD-10-CM

## 2021-07-16 DIAGNOSIS — Z006 Encounter for examination for normal comparison and control in clinical research program: Secondary | ICD-10-CM

## 2021-07-16 LAB — CUP PACEART REMOTE DEVICE CHECK
Battery Remaining Longevity: 75 mo
Battery Remaining Percentage: 95 %
Battery Voltage: 2.98 V
Brady Statistic AP VP Percent: 12 %
Brady Statistic AP VS Percent: 1 %
Brady Statistic AS VP Percent: 88 %
Brady Statistic AS VS Percent: 1 %
Brady Statistic RA Percent Paced: 12 %
Date Time Interrogation Session: 20230328020010
Implantable Lead Implant Date: 20130115
Implantable Lead Implant Date: 20130115
Implantable Lead Implant Date: 20220926
Implantable Lead Location: 753858
Implantable Lead Location: 753859
Implantable Lead Location: 753860
Implantable Pulse Generator Implant Date: 20220926
Lead Channel Impedance Value: 350 Ohm
Lead Channel Impedance Value: 360 Ohm
Lead Channel Impedance Value: 490 Ohm
Lead Channel Pacing Threshold Amplitude: 0.5 V
Lead Channel Pacing Threshold Amplitude: 1 V
Lead Channel Pacing Threshold Amplitude: 1.25 V
Lead Channel Pacing Threshold Pulse Width: 0.4 ms
Lead Channel Pacing Threshold Pulse Width: 0.6 ms
Lead Channel Pacing Threshold Pulse Width: 0.8 ms
Lead Channel Sensing Intrinsic Amplitude: 2.7 mV
Lead Channel Setting Pacing Amplitude: 1.25 V
Lead Channel Setting Pacing Amplitude: 2 V
Lead Channel Setting Pacing Amplitude: 2.5 V
Lead Channel Setting Pacing Pulse Width: 0.6 ms
Lead Channel Setting Pacing Pulse Width: 1 ms
Lead Channel Setting Sensing Sensitivity: 4 mV
Pulse Gen Model: 3562
Pulse Gen Serial Number: 6509372

## 2021-07-17 NOTE — Research (Signed)
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 ?Primary Endpoint: Mean change from Baseline (the last 7 days prior to randomization) to Week 4 (the last 7 days of treatment) in weekly average of the daily IPF Coughing Severity Scale ? ?Protocol # / Study Name: ORV-PF-01 / IPF COMFORT ?Clinical Trials #: [id number] ?Sponsor: [employer]  ? ?Key Features of Orvepitant (study drug): Orvepitant is an inhibitor of the human neurokinin (NK)-1 receptor where it blocks the effects of Substance P (SP), the preferred endogenous ligand of the NK? receptor. Orvepitant has anti-inflammatory activity. The safety and efficacy of orvepitant has previously been evaluated in more than 900 patients and healthy volunteers, including two studies in patients with chronic cough. It has previously been evaluated as a potential treatment for MDD, PTSD and pruritus secondary to treatment with EGFR inhibitors.It is rapidly absorbed with moderate to good oral bioavailability. Clearance is predominantly by metabolism, with CYP3A4 being the primarily responsible ? ?Key Inclusion Criteria: ?Male and male subjects ?71 years of age ?Diagnosis of IPF according to the 2018 joint ATS/ERS/JRS/ALAT criteria ?FEV1/FVC ratio ?0.65 at the Screening visit ?DLCO percent of predicted and corrected by Hb value ?25% within 12 months of Screening  ?Sp O? on room air or O? ?90% at Screening ?Cough attributed to IPF (has not responded to anti-tussive treatment, and present for at least 8 weeks prior to Screening) ?Mean daily IPF Coughing Severity Scale score ?5.0  ?If taking pirfenidone or nintedanib, the dose must have been stable for at least 3 months prior to Screening ? ? ?Key Exclusion Criteria:  ?Recent respiratory tract infection (<8 weeks prior to Screening) ?Recent acute  exacerbation of IPF (<8 weeks prior to Screening) ?Current smokers or ex-smokers with <6 months? abstinence prior to Screening ?Emphysema ?50% on HRCT, or the extent is greater than fibrosis according to most recent scan ?Mean early morning cough scale score ?5.0 and rest of the day cough scale score <5  ?Cough that is predominantly productive in nature and attributable to lung pathology such as chronic bronchitis or bronchiectasis ?Known clinically significant pulmonary hypertension ?A history of clinically relevant drug or alcohol abuse within 6 months prior to Screening ?Any malignancy in the past 5 years unless non-invasive and in remission ?Inability to comply with the use of prohibited and allowed medications as described below:  ?Strong or moderate inhibitors of CYP3A4 are not allowed ?Strong or moderate inducers of CYP3A4 are not allowed ?Strong or moderate P-glycoprotein inhibitors are not allowed ?ACE inhibitors are not allowed within 3 months of Screening and throughout study ?Other treatments for cough management (i.e. opioids, dextromethorphan, gabapentin, etc.) ?Use of other NK? antagonists ?Immune-suppressant drugs and systemic corticosteroids for cough management ?Subjects previously exposed to Orvepitant ? ?  ?Half-life: 7-15 hours after single doses, increasing to approximately 34 hours after repeated dosing. Steady-state is achieved within 14 days.  ? ?Safety Data Investigator?s Brochure, Version 10.0: ?More than 900 human subjects have been exposed to orvepitant, including 328 subjects with refractory or unexplained chronic cough. It is generally tolerated well up to the highest single (240 mg) and repeat dose level (60 mg/day for 28 days). The most common TEAEs in all subjects include fatigue, headache, somnolence, and dizziness. A total of 6 patients discontinued therapy due to treatment-related events involving fatigue, headache and dizziness. The AEs of special interest include dysaesthesia  (and  associated events), convulsions, and possible suicidality-related AEs. ? ?Clinical Trials in Refractory or Unexplained Chronic Cough (RUCC): ?VOLCANO-1, phase 2, open-label study ?Enrolled 13 subjects with RUCC ?12 subjects reported at least one AE, none was severe (mild to moderate intensity), all had resolved by the last study visit. Somnolence was the only AE reported by more than one subject (15%).  ?  ?VOLCANO-2, phase 2 study, multi-center, double-blind, randomized, placebo-controlled dose (10, 20, and 30 mg once daily) ?Enrolled 315 subjects with RUCC ?19.5% of subjects in the orvepitant group experienced a TEAE, while 11.4% in the placebo group. The most common TEAEs: ?Fatigue (8.1%) ?Headache (3.0%) ?Somnolence (3.0%) ?Dizziness (2.1%) ?A total of seven serious TEAEs were experienced by 6 subjects, of which only one event was considered by the investigator (but not the Sponsor) to be related to study treatment (an event of anxiety in a subject in the 30 mg orvepitant group). No deaths occurred during the study. ? ?[facility] [place] Coordinator note :  ? ?This visit for Subject Jefry Lesinski with DOB: 1951/04/09 on 07/17/2021 for the above protocol is Visit/Encounter # Screening and is for purpose of research.  ? ?The consent for this encounter is under Protocol Version: V2.0, approved [id number] ?IB:  V10.0 , approved [id number] ?ICF: approved [id number] and is currently IRB approved.  ? ?Subject expressed continued interest and consent in continuing as a study subject. Subject confirmed that there was  no change in contact information (e.g. address, telephone, email). Subject thanked for participation in research and contribution to science.  In this visit 07/17/2021 the subject will be evaluated by  Sub-Investigator named Unice Cobble, MD. This research coordinator has verified that the above investigator is up to date with his/her training logs.  ? ?The Subject was informed  that the PI Brand Males, MD continues to have oversight of the subject's visits and course  through relevant discussions, reviews and also specifically of this visit by routing of this note to the PI. ?This visit is a key visit of  screening. The PI is not available for this visit.   ?Because the PI is NOT available, the sub-I reported and CRC has confirmed that the PI has discussed the visit a-priori with the sub-investigator. ?In addition, ahead of the key visit of screening, the visit and subject were  discussed with the PI  on date of [id number] face to face as part of direct PI oversight. ? ?This visit was conducted by [employer],with assistance from [employer].  ?Refer to the subjects research binder for further details of the visit.  ? ? ? ?Signed by ?[place] Bing, Cook, Gray ?Clinical Research Coordinator ?[facility]  ?[place], [place] ?11:35 AM 07/17/2021  ? ?

## 2021-07-17 NOTE — Progress Notes (Signed)
William Curry, DOB January 08, 1951,was seen as subject in a clinical trial /Protocol ORV-PF-01/IPF Coughing Severity Scale ?Cardiopulmonary symptoms are stable ?Pertinent physical findings include: ?Slight accentuation of S2 with slight splitting; dry rales @ extreme lung bases , RLL>LLL. Slight bronchovesicular breath sounds superiorly. Slight  clubbing of nailbeds; ?All physical findings NCS ?                                                                    Hendricks Limes MD,SI ? ?

## 2021-07-26 NOTE — Progress Notes (Signed)
Remote pacemaker transmission.   

## 2021-08-06 ENCOUNTER — Other Ambulatory Visit (HOSPITAL_COMMUNITY): Payer: Self-pay

## 2021-08-06 ENCOUNTER — Encounter: Payer: 59 | Admitting: Adult Health

## 2021-08-08 ENCOUNTER — Other Ambulatory Visit (HOSPITAL_COMMUNITY): Payer: Self-pay

## 2021-08-12 ENCOUNTER — Other Ambulatory Visit: Payer: Self-pay | Admitting: Pulmonary Disease

## 2021-08-13 ENCOUNTER — Encounter: Payer: 59 | Admitting: *Deleted

## 2021-08-13 DIAGNOSIS — Z006 Encounter for examination for normal comparison and control in clinical research program: Secondary | ICD-10-CM

## 2021-08-13 DIAGNOSIS — R053 Chronic cough: Secondary | ICD-10-CM

## 2021-08-13 NOTE — Progress Notes (Signed)
Subject William Curry, DOB 20-Dec-1950 , was seen for documentation of consent for participation in Clinical Trial study/Protocol # ORV-PF-01/IPF COMFORT; # [id number].  Inclusion exclusion criteria reviewed. ?He had no questions. ? ?History & exam findings: ?Cardiopulmonary symptoms are stable, but includes a daily productive cough of 2 tablespoons-one fourth of a cup of phlegm.  He uses Hall's drops with some benefit. ?Other or new symptoms denied. ?Pertinent physical findings include: Fine rales only at the extreme bases of the lungs.  Slightly exaggerated S2.  Slight clubbing of the nailbeds. ?All physical findings NCS ?                                                                    Hendricks Limes MD,SI ? ?

## 2021-08-13 NOTE — Research (Signed)
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 ?Primary Endpoint: Mean change from Baseline (the last 7 days prior to randomization) to Week 4 (the last 7 days of treatment) in weekly average of the daily IPF Coughing Severity Scale ? ?Protocol # / Study Name: ORV-PF-01 / IPF COMFORT ?Clinical Trials #: [id number] ?Sponsor: [employer]  ? ?Key Features of Orvepitant (study drug): Orvepitant is an inhibitor of the human neurokinin (NK)-1 receptor where it blocks the effects of Substance P (SP), the preferred endogenous ligand of the NK? receptor. Orvepitant has anti-inflammatory activity. The safety and efficacy of orvepitant has previously been evaluated in more than 900 patients and healthy volunteers, including two studies in patients with chronic cough. It has previously been evaluated as a potential treatment for MDD, PTSD and pruritus secondary to treatment with EGFR inhibitors.It is rapidly absorbed with moderate to good oral bioavailability. Clearance is predominantly by metabolism, with CYP3A4 being the primarily responsible ? ?Key Inclusion Criteria: ?Male and male subjects ?71 years of age ?Diagnosis of IPF according to the 2018 joint ATS/ERS/JRS/ALAT criteria ?FEV1/FVC ratio ?0.65 at the Screening visit ?DLCO percent of predicted and corrected by Hb value ?25% within 12 months of Screening  ?Sp O? on room air or O? ?90% at Screening ?Cough attributed to IPF (has not responded to anti-tussive treatment, and present for at least 8 weeks prior to Screening) ?Mean daily IPF Coughing Severity Scale score ?5.0  ?If taking pirfenidone or nintedanib, the dose must have been stable for at least 3 months prior to Screening ? ? ?Key Exclusion Criteria:  ?Recent respiratory tract infection (<8 weeks prior to Screening) ?Recent acute  exacerbation of IPF (<8 weeks prior to Screening) ?Current smokers or ex-smokers with <6 months? abstinence prior to Screening ?Emphysema ?50% on HRCT, or the extent is greater than fibrosis according to most recent scan ?Mean early morning cough scale score ?5.0 and rest of the day cough scale score <5  ?Cough that is predominantly productive in nature and attributable to lung pathology such as chronic bronchitis or bronchiectasis ?Known clinically significant pulmonary hypertension ?A history of clinically relevant drug or alcohol abuse within 6 months prior to Screening ?Any malignancy in the past 5 years unless non-invasive and in remission ?Inability to comply with the use of prohibited and allowed medications as described below:  ?Strong or moderate inhibitors of CYP3A4 are not allowed ?Strong or moderate inducers of CYP3A4 are not allowed ?Strong or moderate P-glycoprotein inhibitors are not allowed ?ACE inhibitors are not allowed within 3 months of Screening and throughout study ?Other treatments for cough management (i.e. opioids, dextromethorphan, gabapentin, etc.) ?Use of other NK? antagonists ?Immune-suppressant drugs and systemic corticosteroids for cough management ?Subjects previously exposed to Orvepitant ? ?  ?Half-life: 7-15 hours after single doses, increasing to approximately 34 hours after repeated dosing. Steady-state is achieved within 14 days.  ? ?Safety Data Investigator?s Brochure, Version 10.0: ?More than 900 human subjects have been exposed to orvepitant, including 328 subjects with refractory or unexplained chronic cough. It is generally tolerated well up to the highest single (240 mg) and repeat dose level (60 mg/day for 28 days). The most common TEAEs in all subjects include fatigue, headache, somnolence, and dizziness. A total of 6 patients discontinued therapy due to treatment-related events involving fatigue, headache and dizziness. The AEs of special interest include dysaesthesia  (and  associated events), convulsions, and possible suicidality-related AEs. ? ?Clinical Trials in Refractory or Unexplained Chronic Cough (RUCC): ?VOLCANO-1, phase 2, open-label study ?Enrolled 13 subjects with RUCC ?12 subjects reported at least one AE, none was severe (mild to moderate intensity), all had resolved by the last study visit. Somnolence was the only AE reported by more than one subject (15%).  ?  ?VOLCANO-2, phase 2 study, multi-center, double-blind, randomized, placebo-controlled dose (10, 20, and 30 mg once daily) ?Enrolled 315 subjects with RUCC ?19.5% of subjects in the orvepitant group experienced a TEAE, while 11.4% in the placebo group. The most common TEAEs: ?Fatigue (8.1%) ?Headache (3.0%) ?Somnolence (3.0%) ?Dizziness (2.1%) ?A total of seven serious TEAEs were experienced by 6 subjects, of which only one event was considered by the investigator (but not the Sponsor) to be related to study treatment (an event of anxiety in a subject in the 30 mg orvepitant group). No deaths occurred during the study. ? ?[facility] [place] Coordinator note :  ? ?This visit for Subject William Curry with DOB: 30-Aug-1950 on 08/13/2021 for the above protocol is Visit/Encounter # 2 Randomization and is for purpose of research.  ? ?The consent for this encounter is under Protocol Version: V2.0, approved [id number] ?IB:  V10.0 , approved [id number] ?ICF: approved [id number] and is currently IRB approved.  ? ?Subject expressed continued interest and consent in continuing as a study subject. Subject confirmed that there was  no change in contact information (e.g. address, telephone, email). Subject thanked for participation in research and contribution to science.  In this visit 08/13/2021 the subject will be evaluated by  Sub-Investigator named Unice Cobble, MD. This research coordinator has verified that the above investigator is up to date with his/her training logs.  ? ?The Subject was  informed that the PI Brand Males, MD continues to have oversight of the subject's visits and course  through relevant discussions, reviews and also specifically of this visit by routing of this note to the PI. ?This visit is a key visit of  screening. The PI is not available for this visit.   ?Because the PI is NOT available, the sub-I reported and CRC has confirmed that the PI has discussed the visit a-priori with the sub-investigator. ?In addition, ahead of the key visit of Randomization, the visit and subject were  discussed with the PI  on date of  08/08/2021 face to face as part of direct PI oversight. ? ?Refer to the subjects research binder for further details of the visit.  ? ? ? ?Signed by ?Leda Gauze Delaine Canter ?Research Assistant  ?[facility]  ?[place], [place] ?12:48 PM 08/13/2021  ? ?

## 2021-08-26 ENCOUNTER — Telehealth: Payer: Self-pay

## 2021-08-26 DIAGNOSIS — J84112 Idiopathic pulmonary fibrosis: Secondary | ICD-10-CM

## 2021-08-26 DIAGNOSIS — Z5181 Encounter for therapeutic drug level monitoring: Secondary | ICD-10-CM

## 2021-08-26 NOTE — Telephone Encounter (Signed)
Received faxed PA renewal form from [place] for [employer]. Per instructions, a new PA will need to be initiated if pt's therapy can be changed to generic Pirfenidone. ? ?Submitted a Prior Authorization request to [facility] for PIRFENIDONE via CoverMyMeds. Will update once we receive a response. ? ? ?Key: [place] ?

## 2021-08-27 ENCOUNTER — Encounter: Payer: 59 | Admitting: *Deleted

## 2021-08-27 DIAGNOSIS — R053 Chronic cough: Secondary | ICD-10-CM

## 2021-08-27 DIAGNOSIS — Z006 Encounter for examination for normal comparison and control in clinical research program: Secondary | ICD-10-CM

## 2021-08-27 NOTE — Research (Signed)
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 receptor. Orvepitant has anti-inflammatory activity. The safety and efficacy of orvepitant has previously been evaluated in more than 900 patients and healthy volunteers, including two studies in patients with chronic cough. It has previously been evaluated as a potential treatment for MDD, PTSD and pruritus secondary to treatment with EGFR inhibitors.It is rapidly absorbed with moderate to good oral bioavailability. Clearance is predominantly by metabolism, with CYP3A4 being the primarily responsible  Key Inclusion Criteria: Male and male subjects ?71 years of age Diagnosis of IPF according to the 2018 joint ATS/ERS/JRS/ALAT criteria FEV1/FVC ratio ?0.65 at the Screening visit DLCO percent of predicted and corrected by Hb value ?25% within 12 months of Screening  Sp O? on room air or O? ?90% at Screening Cough attributed to IPF (has not responded to anti-tussive treatment, and present for at least 8 weeks prior to Screening) Mean daily IPF Coughing Severity Scale score ?5.0  If taking pirfenidone or nintedanib, the dose must have been stable for at least 3 months prior to Screening   Key Exclusion Criteria:  Recent respiratory tract infection (<8 weeks prior to Screening) Recent acute  exacerbation of IPF (<8 weeks prior to Screening) Current smokers or ex-smokers with <6 months' abstinence prior to Screening Emphysema ?50% on HRCT, or the extent is greater than fibrosis according to most recent scan Mean early morning cough scale score ?5.0 and rest of the day cough scale score <5  Cough that is predominantly productive in nature and attributable to lung pathology such as chronic bronchitis or bronchiectasis Known clinically significant pulmonary hypertension A history of clinically relevant drug or alcohol abuse within 6 months prior to Screening Any malignancy in the past 5 years unless non-invasive and in remission Inability to comply with the use of prohibited and allowed medications as described below:  Strong or moderate inhibitors of CYP3A4 are not allowed Strong or moderate inducers of CYP3A4 are not allowed Strong or moderate P-glycoprotein inhibitors are not allowed ACE inhibitors are not allowed within 3 months of Screening and throughout study Other treatments for cough management (i.e. opioids, dextromethorphan, gabapentin, etc.) Use of other NK? antagonists Immune-suppressant drugs and systemic corticosteroids for cough management Subjects previously exposed to Orvepitant    Half-life: 7-15 hours after single doses, increasing to approximately 34 hours after repeated dosing. Steady-state is achieved within 14 days.   Safety Data Investigator's Brochure, Version 10.0: More than 900 human subjects have been exposed to orvepitant, including 328 subjects with refractory or unexplained chronic cough. It is generally tolerated well up to the highest single (240 mg) and repeat dose level (60 mg/day for 28 days). The most common TEAEs in all subjects include fatigue, headache, somnolence, and dizziness. A total of 6 patients discontinued therapy due to treatment-related events involving fatigue, headache and dizziness. The AEs of special interest include dysaesthesia  (and  associated events), convulsions, and possible suicidality-related AEs.  Clinical Trials in Refractory or Unexplained Chronic Cough (RUCC): VOLCANO-1, phase 2, open-label study Enrolled 13 subjects with RUCC 12 subjects reported at least one AE, none was severe (mild to moderate intensity), all had resolved by the last study visit. Somnolence was the only AE reported by more than one subject (15%).    VOLCANO-2, phase 2 study, multi-center, double-blind, randomized, placebo-controlled dose (10, 20, and 30 mg once daily) Enrolled 315 subjects with RUCC 19.5% of subjects in the orvepitant group experienced a TEAE, while 11.4% in the placebo group. The most common TEAEs: Fatigue (8.1%) Headache (3.0%) Somnolence (3.0%) Dizziness (2.1%) A total of seven serious TEAEs were experienced by 6 subjects, of which only one event was considered by the investigator (but not the Sponsor) to be related to study treatment (an event of anxiety in a subject in the 30 mg orvepitant group). No deaths occurred during the study.  [facility] [place] Coordinator note :   This visit for Subject William Curry with DOB: 1951-01-03 on 08/27/2021 for the above protocol is Visit/Encounter # 3 Randomization and is for purpose of research.   The consent for this encounter is under Protocol Version: V2.0, approved [id number] IB:  V10.0 , approved [id number] ICF: approved [id number] and is currently IRB approved.   Subject expressed continued interest and consent in continuing as a study subject. Subject confirmed that there was  no change in contact information (e.g. address, telephone, email). Subject thanked for participation in research and contribution to science.  In this visit 08/27/2021 patient completed two questionnaires and medication list was reviewed.   The Subject was informed that the PI William Males, MD continues to have oversight of the subject's visits and course  through relevant  discussions, reviews and also specifically of this visit by routing of this note to the PI.  Refer to the subjects research binder for further details of the visit.     Signed by William Curry Assistant  [facility]  [place], [place] 10:35 AM 08/27/2021

## 2021-08-28 ENCOUNTER — Other Ambulatory Visit (HOSPITAL_COMMUNITY): Payer: Self-pay

## 2021-08-28 NOTE — Telephone Encounter (Signed)
Received notification from [facility] regarding a prior authorization for PIRFENIDONE. Authorization has been APPROVED from 08/27/21 to 08/27/22.  ? ?Unable to run test claim as patient must fill [facility] (pulmonary fibrosis team): [phone]. ? ?Authorization # [id number] ? ?ATC patient to advise and provide pharmacy number. Unable to reach. Left VM requesting return call ? ?Knox Saliva, PharmD, MPH, BCPS, CPP ?Clinical Pharmacist (Rheumatology and Pulmonology) ? ? ?

## 2021-08-29 MED ORDER — PIRFENIDONE 801 MG PO TABS: 801.0000 mg | ORAL_TABLET | Freq: Three times a day (TID) | ORAL | 1 refills | Status: DC

## 2021-08-29 NOTE — Telephone Encounter (Signed)
Rx for pirfenidone '801mg'$  three times daily sent to [facility]. ? ?Called patient and provided with pharmacy phone number to follow-up. He states he is going out of town in a couple of weeks. Advised him to call pharmacy later today or tomorrow to schedule shipment and discuss that he will out of town to request earlier shipment ? ?Knox Saliva, PharmD, MPH, BCPS, CPP ?Clinical Pharmacist (Rheumatology and Pulmonology) ?

## 2021-09-02 ENCOUNTER — Other Ambulatory Visit (HOSPITAL_COMMUNITY): Payer: Self-pay

## 2021-09-10 ENCOUNTER — Encounter: Payer: 59 | Admitting: *Deleted

## 2021-09-10 DIAGNOSIS — Z006 Encounter for examination for normal comparison and control in clinical research program: Secondary | ICD-10-CM

## 2021-09-10 DIAGNOSIS — R053 Chronic cough: Secondary | ICD-10-CM

## 2021-09-10 NOTE — Research (Signed)
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 receptor. Orvepitant has anti-inflammatory activity. The safety and efficacy of orvepitant has previously been evaluated in more than 900 patients and healthy volunteers, including two studies in patients with chronic cough. It has previously been evaluated as a potential treatment for MDD, PTSD and pruritus secondary to treatment with EGFR inhibitors.It is rapidly absorbed with moderate to good oral bioavailability. Clearance is predominantly by metabolism, with CYP3A4 being the primarily responsible  Key Inclusion Criteria: Male and male subjects ?71 years of age Diagnosis of IPF according to the 2018 joint ATS/ERS/JRS/ALAT criteria FEV1/FVC ratio ?0.65 at the Screening visit DLCO percent of predicted and corrected by Hb value ?25% within 12 months of Screening  Sp O? on room air or O? ?90% at Screening Cough attributed to IPF (has not responded to anti-tussive treatment, and present for at least 8 weeks prior to Screening) Mean daily IPF Coughing Severity Scale score ?5.0  If taking pirfenidone or nintedanib, the dose must have been stable for at least 3 months prior to Screening   Key Exclusion Criteria:  Recent respiratory tract infection (<8 weeks prior to Screening) Recent acute  exacerbation of IPF (<8 weeks prior to Screening) Current smokers or ex-smokers with <6 months' abstinence prior to Screening Emphysema ?50% on HRCT, or the extent is greater than fibrosis according to most recent scan Mean early morning cough scale score ?5.0 and rest of the day cough scale score <5  Cough that is predominantly productive in nature and attributable to lung pathology such as chronic bronchitis or bronchiectasis Known clinically significant pulmonary hypertension A history of clinically relevant drug or alcohol abuse within 6 months prior to Screening Any malignancy in the past 5 years unless non-invasive and in remission Inability to comply with the use of prohibited and allowed medications as described below:  Strong or moderate inhibitors of CYP3A4 are not allowed Strong or moderate inducers of CYP3A4 are not allowed Strong or moderate P-glycoprotein inhibitors are not allowed ACE inhibitors are not allowed within 3 months of Screening and throughout study Other treatments for cough management (i.e. opioids, dextromethorphan, gabapentin, etc.) Use of other NK? antagonists Immune-suppressant drugs and systemic corticosteroids for cough management Subjects previously exposed to Orvepitant    Half-life: 7-15 hours after single doses, increasing to approximately 34 hours after repeated dosing. Steady-state is achieved within 14 days.   Safety Data Investigator's Brochure, Version 10.0: More than 900 human subjects have been exposed to orvepitant, including 328 subjects with refractory or unexplained chronic cough. It is generally tolerated well up to the highest single (240 mg) and repeat dose level (60 mg/day for 28 days). The most common TEAEs in all subjects include fatigue, headache, somnolence, and dizziness. A total of 6 patients discontinued therapy due to treatment-related events involving fatigue, headache and dizziness. The AEs of special interest include dysaesthesia  (and  associated events), convulsions, and possible suicidality-related AEs.  Clinical Trials in Refractory or Unexplained Chronic Cough (RUCC): VOLCANO-1, phase 2, open-label study Enrolled 13 subjects with RUCC 12 subjects reported at least one AE, none was severe (mild to moderate intensity), all had resolved by the last study visit. Somnolence was the only AE reported by more than one subject (15%).    VOLCANO-2, phase 2 study, multi-center, double-blind, randomized, placebo-controlled dose (10, 20, and 30 mg once daily) Enrolled 315 subjects with RUCC 19.5% of subjects in the orvepitant group experienced a TEAE, while 11.4% in the placebo group. The most common TEAEs: Fatigue (8.1%) Headache (3.0%) Somnolence (3.0%) Dizziness (2.1%) A total of seven serious TEAEs were experienced by 6 subjects, of which only one event was considered by the investigator (but not the Sponsor) to be related to study treatment (an event of anxiety in a subject in the 30 mg orvepitant group). No deaths occurred during the study.  [facility] [place] Coordinator note :   This visit for Subject William Curry with DOB: 1950-12-11 on 09/10/2021 for the above protocol is Visit/Encounter # 4 and is for purpose of research.   The consent for this encounter is under Protocol Version: V2.0, approved [id number] IB:  V10.0 , approved [id number] ICF: approved [id number] and is currently IRB approved.   Subject expressed continued interest and consent in continuing as a study subject. Subject confirmed that there was  no change in contact information (e.g. address, telephone, email). Subject thanked for participation in research and contribution to science.  In this visit 09/10/2021 patient completed two questionnaires and medication list was reviewed.   The Subject was informed that the PI Brand Males, MD continues to have oversight of the subject's visits and course  through relevant discussions,  reviews and also specifically of this visit by routing of this note to the PI.  Refer to the subjects research binder for further details of the visit.     Signed by [place] Assistant  [facility]  [place], [place] 3:16 PM 09/10/2021

## 2021-10-01 ENCOUNTER — Encounter: Payer: 59 | Admitting: *Deleted

## 2021-10-01 DIAGNOSIS — Z006 Encounter for examination for normal comparison and control in clinical research program: Secondary | ICD-10-CM

## 2021-10-01 DIAGNOSIS — R053 Chronic cough: Secondary | ICD-10-CM

## 2021-10-01 NOTE — Research (Signed)
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 receptor. Orvepitant has anti-inflammatory activity. The safety and efficacy of orvepitant has previously been evaluated in more than 900 patients and healthy volunteers, including two studies in patients with chronic cough. It has previously been evaluated as a potential treatment for MDD, PTSD and pruritus secondary to treatment with EGFR inhibitors.It is rapidly absorbed with moderate to good oral bioavailability. Clearance is predominantly by metabolism, with CYP3A4 being the primarily responsible  Key Inclusion Criteria: Male and male subjects ?71 years of age Diagnosis of IPF according to the 2018 joint ATS/ERS/JRS/ALAT criteria FEV1/FVC ratio ?0.65 at the Screening visit DLCO percent of predicted and corrected by Hb value ?25% within 12 months of Screening  Sp O? on room air or O? ?90% at Screening Cough attributed to IPF (has not responded to anti-tussive treatment, and present for at least 8 weeks prior to Screening) Mean daily IPF Coughing Severity Scale score ?5.0  If taking pirfenidone or nintedanib, the dose must have been stable for at least 3 months prior to Screening   Key Exclusion Criteria:  Recent respiratory tract infection (<8 weeks prior to Screening) Recent acute  exacerbation of IPF (<8 weeks prior to Screening) Current smokers or ex-smokers with <6 months' abstinence prior to Screening Emphysema ?50% on HRCT, or the extent is greater than fibrosis according to most recent scan Mean early morning cough scale score ?5.0 and rest of the day cough scale score <5  Cough that is predominantly productive in nature and attributable to lung pathology such as chronic bronchitis or bronchiectasis Known clinically significant pulmonary hypertension A history of clinically relevant drug or alcohol abuse within 6 months prior to Screening Any malignancy in the past 5 years unless non-invasive and in remission Inability to comply with the use of prohibited and allowed medications as described below:  Strong or moderate inhibitors of CYP3A4 are not allowed Strong or moderate inducers of CYP3A4 are not allowed Strong or moderate P-glycoprotein inhibitors are not allowed ACE inhibitors are not allowed within 3 months of Screening and throughout study Other treatments for cough management (i.e. opioids, dextromethorphan, gabapentin, etc.) Use of other NK? antagonists Immune-suppressant drugs and systemic corticosteroids for cough management Subjects previously exposed to Orvepitant    Half-life: 7-15 hours after single doses, increasing to approximately 34 hours after repeated dosing. Steady-state is achieved within 14 days.   Safety Data Investigator's Brochure, Version 10.0: More than 900 human subjects have been exposed to orvepitant, including 328 subjects with refractory or unexplained chronic cough. It is generally tolerated well up to the highest single (240 mg) and repeat dose level (60 mg/day for 28 days). The most common TEAEs in all subjects include fatigue, headache, somnolence, and dizziness. A total of 6 patients discontinued therapy due to treatment-related events involving fatigue, headache and dizziness. The AEs of special interest include dysaesthesia  (and  associated events), convulsions, and possible suicidality-related AEs.  Clinical Trials in Refractory or Unexplained Chronic Cough (RUCC): VOLCANO-1, phase 2, open-label study Enrolled 13 subjects with RUCC 12 subjects reported at least one AE, none was severe (mild to moderate intensity), all had resolved by the last study visit. Somnolence was the only AE reported by more than one subject (15%).    VOLCANO-2, phase 2 study, multi-center, double-blind, randomized, placebo-controlled dose (10, 20, and 30 mg once daily) Enrolled 315 subjects with RUCC 19.5% of subjects in the orvepitant group experienced a TEAE, while 11.4% in the placebo group. The most common TEAEs: Fatigue (8.1%) Headache (3.0%) Somnolence (3.0%) Dizziness (2.1%) A total of seven serious TEAEs were experienced by 6 subjects, of which only one event was considered by the investigator (but not the Sponsor) to be related to study treatment (an event of anxiety in a subject in the 30 mg orvepitant group). No deaths occurred during the study.  [facility] [place] Coordinator note :   This visit for Subject Caidon Foti with DOB: 1951/01/05 on 10/01/2021 for the above protocol is Visit/Encounter # 5 and is for purpose of research.   The consent for this encounter is under Protocol Version: V2.0, approved [id number] IB:  V10.0 , approved [id number] ICF: approved [id number] and is currently IRB approved.   Subject expressed continued interest and consent in continuing as a study subject. Subject confirmed that there was  no change in contact information (e.g. address, telephone, email). Subject thanked for participation in research and contribution to science.  In this visit 10/01/2021 patient completed two questionnaires and medication list was reviewed.   The Subject was informed that the PI Brand Males, MD continues to have oversight of the subject's visits and course  through relevant discussions,  reviews and also specifically of this visit by routing of this note to the PI.  Refer to the subjects research binder for further details of the visit.     Signed by [place] Assistant  [facility]  [place], [place] 11:39 AM 10/01/2021

## 2021-10-02 ENCOUNTER — Other Ambulatory Visit: Payer: Self-pay | Admitting: Pulmonary Disease

## 2021-10-15 ENCOUNTER — Ambulatory Visit (INDEPENDENT_AMBULATORY_CARE_PROVIDER_SITE_OTHER): Payer: 59

## 2021-10-15 ENCOUNTER — Encounter: Payer: 59 | Admitting: *Deleted

## 2021-10-15 DIAGNOSIS — I442 Atrioventricular block, complete: Secondary | ICD-10-CM | POA: Diagnosis not present

## 2021-10-15 DIAGNOSIS — R053 Chronic cough: Secondary | ICD-10-CM

## 2021-10-15 DIAGNOSIS — Z006 Encounter for examination for normal comparison and control in clinical research program: Secondary | ICD-10-CM

## 2021-10-16 LAB — CUP PACEART REMOTE DEVICE CHECK
Battery Remaining Longevity: 71 mo
Battery Remaining Percentage: 91 %
Battery Voltage: 2.98 V
Brady Statistic AP VP Percent: 10 %
Brady Statistic AP VS Percent: 1 %
Brady Statistic AS VP Percent: 90 %
Brady Statistic AS VS Percent: 1 %
Brady Statistic RA Percent Paced: 10 %
Date Time Interrogation Session: 20230627020009
Implantable Lead Implant Date: 20130115
Implantable Lead Implant Date: 20130115
Implantable Lead Implant Date: 20220926
Implantable Lead Location: 753858
Implantable Lead Location: 753859
Implantable Lead Location: 753860
Implantable Pulse Generator Implant Date: 20220926
Lead Channel Impedance Value: 350 Ohm
Lead Channel Impedance Value: 390 Ohm
Lead Channel Impedance Value: 480 Ohm
Lead Channel Pacing Threshold Amplitude: 0.5 V
Lead Channel Pacing Threshold Amplitude: 1 V
Lead Channel Pacing Threshold Amplitude: 1.25 V
Lead Channel Pacing Threshold Pulse Width: 0.4 ms
Lead Channel Pacing Threshold Pulse Width: 0.6 ms
Lead Channel Pacing Threshold Pulse Width: 0.8 ms
Lead Channel Sensing Intrinsic Amplitude: 12 mV
Lead Channel Sensing Intrinsic Amplitude: 2.6 mV
Lead Channel Setting Pacing Amplitude: 1.25 V
Lead Channel Setting Pacing Amplitude: 2 V
Lead Channel Setting Pacing Amplitude: 2.5 V
Lead Channel Setting Pacing Pulse Width: 0.6 ms
Lead Channel Setting Pacing Pulse Width: 1 ms
Lead Channel Setting Sensing Sensitivity: 4 mV
Pulse Gen Model: 3562
Pulse Gen Serial Number: 6509372

## 2021-10-25 NOTE — Research (Signed)
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 receptor. Orvepitant has anti-inflammatory activity. The safety and efficacy of orvepitant has previously been evaluated in more than 900 patients and healthy volunteers, including two studies in patients with chronic cough. It has previously been evaluated as a potential treatment for MDD, PTSD and pruritus secondary to treatment with EGFR inhibitors.It is rapidly absorbed with moderate to good oral bioavailability. Clearance is predominantly by metabolism, with CYP3A4 being the primarily responsible  Key Inclusion Criteria: Male and male subjects ?71 years of age Diagnosis of IPF according to the 2018 joint ATS/ERS/JRS/ALAT criteria FEV1/FVC ratio ?0.65 at the Screening visit DLCO percent of predicted and corrected by Hb value ?25% within 12 months of Screening  Sp O? on room air or O? ?90% at Screening Cough attributed to IPF (has not responded to anti-tussive treatment, and present for at least 8 weeks prior to Screening) Mean daily IPF Coughing Severity Scale score ?5.0  If taking pirfenidone or nintedanib, the dose must have been stable for at least 3 months prior to Screening   Key Exclusion Criteria:  Recent respiratory tract infection (<8 weeks prior to Screening) Recent acute  exacerbation of IPF (<8 weeks prior to Screening) Current smokers or ex-smokers with <6 months' abstinence prior to Screening Emphysema ?50% on HRCT, or the extent is greater than fibrosis according to most recent scan Mean early morning cough scale score ?5.0 and rest of the day cough scale score <5  Cough that is predominantly productive in nature and attributable to lung pathology such as chronic bronchitis or bronchiectasis Known clinically significant pulmonary hypertension A history of clinically relevant drug or alcohol abuse within 6 months prior to Screening Any malignancy in the past 5 years unless non-invasive and in remission Inability to comply with the use of prohibited and allowed medications as described below:  Strong or moderate inhibitors of CYP3A4 are not allowed Strong or moderate inducers of CYP3A4 are not allowed Strong or moderate P-glycoprotein inhibitors are not allowed ACE inhibitors are not allowed within 3 months of Screening and throughout study Other treatments for cough management (i.e. opioids, dextromethorphan, gabapentin, etc.) Use of other NK? antagonists Immune-suppressant drugs and systemic corticosteroids for cough management Subjects previously exposed to Orvepitant    Half-life: 7-15 hours after single doses, increasing to approximately 34 hours after repeated dosing. Steady-state is achieved within 14 days.   Safety Data Investigator's Brochure, Version 10.0: More than 900 human subjects have been exposed to orvepitant, including 328 subjects with refractory or unexplained chronic cough. It is generally tolerated well up to the highest single (240 mg) and repeat dose level (60 mg/day for 28 days). The most common TEAEs in all subjects include fatigue, headache, somnolence, and dizziness. A total of 6 patients discontinued therapy due to treatment-related events involving fatigue, headache and dizziness. The AEs of special interest include dysaesthesia  (and  associated events), convulsions, and possible suicidality-related AEs.  Clinical Trials in Refractory or Unexplained Chronic Cough (RUCC): VOLCANO-1, phase 2, open-label study Enrolled 13 subjects with RUCC 12 subjects reported at least one AE, none was severe (mild to moderate intensity), all had resolved by the last study visit. Somnolence was the only AE reported by more than one subject (15%).    VOLCANO-2, phase 2 study, multi-center, double-blind, randomized, placebo-controlled dose (10, 20, and 30 mg once daily) Enrolled 315 subjects with RUCC 19.5% of subjects in the orvepitant group experienced a TEAE, while 11.4% in the placebo group. The most common TEAEs: Fatigue (8.1%) Headache (3.0%) Somnolence (3.0%) Dizziness (2.1%) A total of seven serious TEAEs were experienced by 6 subjects, of which only one event was considered by the investigator (but not the Sponsor) to be related to study treatment (an event of anxiety in a subject in the 30 mg orvepitant group). No deaths occurred during the study.  [facility] [place] Coordinator note :   This visit for Subject Jaysiah Marchetta with DOB: 1950/09/12 on 10/15/2021 for the above protocol is Visit/Encounter # 6 and is for purpose of research.   The consent for this encounter is under Protocol Version: V2.0, approved [id number] IB:  V10.0 , approved [id number] ICF: approved [id number] and is currently IRB approved.   Subject expressed continued interest and consent in continuing as a study subject. Subject confirmed that there was  no change in contact information (e.g. address, telephone, email). Subject thanked for participation in research and contribution to science.  In this visit 10/15/2021 patient completed two questionnaires and medication list was reviewed.   The Subject was informed that the PI Brand Males, MD continues to have oversight of the subject's visits and course  through relevant discussions,  reviews and also specifically of this visit by routing of this note to the PI.  Refer to the subjects research binder for further details of the visit.     Signed by [place] Assistant  [facility]  [place], [place] 10:01 AM 10/25/2021

## 2021-10-29 ENCOUNTER — Encounter: Payer: 59 | Admitting: *Deleted

## 2021-10-29 ENCOUNTER — Other Ambulatory Visit: Payer: Self-pay | Admitting: Family Medicine

## 2021-10-29 VITALS — BP 112/70 | HR 78 | Temp 98.2°F | Resp 16

## 2021-10-29 DIAGNOSIS — J84112 Idiopathic pulmonary fibrosis: Secondary | ICD-10-CM

## 2021-10-29 DIAGNOSIS — Z006 Encounter for examination for normal comparison and control in clinical research program: Secondary | ICD-10-CM

## 2021-10-29 DIAGNOSIS — R053 Chronic cough: Secondary | ICD-10-CM

## 2021-10-29 NOTE — Research (Signed)
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   The consent for this encounter is under Protocol Version: V3.0, approved [id number] IB:  V10.0, approved [id number] ICF: approved [id number]  Subject expressed continued interest and consent in continuing as a study subject. Subject confirmed that there was change in contact information (e.g. address, telephone, email). Subject thanked for participation in research and contribution to science.   The Subject was informed that the PI Dr. Chase Caller continues to have oversight of the subject's visits and course  through relevant discussions, reviews and also specifically of this visit by routing of this note to the [facility].  The patient appeared well today. He reported no new complaints since his last research visit. Visit assessments were completed per protocol. Please refer to paper source subject binder for further details.   Signed by  Lytle Michaels  Clinical Research Coordinator [facility]  [place], [place] 10:58 AM 10/29/2021

## 2021-11-05 NOTE — Progress Notes (Signed)
Remote pacemaker transmission.   

## 2021-11-12 ENCOUNTER — Encounter: Payer: 59 | Admitting: *Deleted

## 2021-11-12 ENCOUNTER — Encounter: Payer: Self-pay | Admitting: Adult Health

## 2021-11-12 ENCOUNTER — Encounter (INDEPENDENT_AMBULATORY_CARE_PROVIDER_SITE_OTHER): Payer: 59 | Admitting: Adult Health

## 2021-11-12 VITALS — BP 114/69 | HR 67 | Temp 98.2°F | Resp 18 | Wt 150.8 lb

## 2021-11-12 DIAGNOSIS — Z006 Encounter for examination for normal comparison and control in clinical research program: Secondary | ICD-10-CM

## 2021-11-12 DIAGNOSIS — J849 Interstitial pulmonary disease, unspecified: Secondary | ICD-10-CM

## 2021-11-12 DIAGNOSIS — J84112 Idiopathic pulmonary fibrosis: Secondary | ICD-10-CM

## 2021-11-12 DIAGNOSIS — R053 Chronic cough: Secondary | ICD-10-CM

## 2021-11-12 NOTE — Progress Notes (Signed)
'@Patient'  ID: William Curry, male    DOB: [birth year], 71 y.o.   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 receptor. Orvepitant has anti-inflammatory activity. The safety and efficacy of orvepitant has previously been evaluated in more than 900 patients and healthy volunteers, including two studies in patients with chronic cough. It has previously been evaluated as a potential treatment for MDD, PTSD and pruritus secondary to treatment with EGFR inhibitors.It is rapidly absorbed with moderate to good oral bioavailability. Clearance is predominantly by metabolism, with CYP3A4 being the primarily responsible  Key Inclusion Criteria: Male and male subjects ?71 years of age Diagnosis of IPF according to the 2018 or 2022 joint ATS/ERS/JRS/ALAT criteria FEV1/FVC ratio ?0.65 at the Screening visit DLCO percent of predicted and corrected by Hb value ?25% within 12 months of Screening  Sp O? on room air or O? ?90% at Screening Cough attributed to IPF (has not responded to anti-tussive treatment, and present for at least 8 weeks prior to Screening) If taking pirfenidone or nintedanib, the dose must have been started at  least 3 months prior to Screening, and the prescribed dose must have been stable for at least 1 month prior to Screening   Key Exclusion Criteria:  Recent respiratory tract infection (<8 weeks prior to Screening) Recent acute exacerbation of IPF (<8 weeks prior to Screening) Current smokers or ex-smokers with <6 months' abstinence prior to Screening Emphysema ?50% on HRCT, or the extent is greater than fibrosis according to most recent scan Cough that is predominantly productive in nature and attributable to lung pathology such as chronic bronchitis or bronchiectasis Known clinically significant pulmonary hypertension A history of clinically relevant drug or alcohol abuse within 6 months prior to Screening Any malignancy in the past 5 years unless non-invasive and in remission Inability to comply with the use of prohibited and allowed medications as described below:  Strong or moderate inhibitors of CYP3A4 are not allowed Strong or moderate inducers of CYP3A4 are not allowed Strong or moderate P-glycoprotein inhibitors are not allowed ACE inhibitors are not allowed within 3 months of Screening and throughout study Other treatments for cough management (i.e. opioids, dextromethorphan, gabapentin, etc.) Use of other NK? antagonists Immune-suppressant drugs and systemic corticosteroids for cough management Subjects previously exposed to Orvepitant    Half-life: 7-15 hours after single doses, increasing to approximately 34 hours after repeated dosing. Steady-state is achieved within 14 days.   Safety Data Investigator's Brochure, Version 10.0: More than 900 human subjects have been exposed to orvepitant, including 328 subjects with refractory or unexplained chronic cough. It is generally tolerated well up to the highest single (240 mg) and repeat dose level (60 mg/day for 28 days). The most common TEAEs in all subjects include  fatigue, headache, somnolence, and dizziness. A total of 6 patients  discontinued therapy due to treatment-related events involving fatigue, headache and dizziness. The AEs of special interest include dysaesthesia (and associated events), convulsions, and possible suicidality-related AEs.  Clinical Trials in Refractory or Unexplained Chronic Cough (RUCC): VOLCANO-1, phase 2, open-label study Enrolled 13 subjects with RUCC 12 subjects reported at least one AE, none was severe (mild to moderate intensity), all had resolved by the last study visit. Somnolence was the only AE reported by more than one subject (15%).    VOLCANO-2, phase 2 study, multi-center, double-blind, randomized, placebo-controlled dose (10, 20, and 30 mg once daily) Enrolled 315 subjects with RUCC 19.5% of subjects in the orvepitant group experienced a TEAE, while 11.4% in the placebo group. The most common TEAEs: Fatigue (8.1%) Headache (3.0%) Somnolence (3.0%) Dizziness (2.1%) A total of seven serious TEAEs were experienced by 6 subjects, of which only one event was considered by the investigator (but not the Sponsor) to be related to study treatment (an event of anxiety in a subject in the 30 mg orvepitant group). No deaths occurred during the study.   11/12/2021 Research Visit #8 Final visit  This visit for Subject William Curry with DOB: 05/13/1950 on 11/12/2021 for the above protocol is Visit/Encounter # 8   and is for purpose of Research  . Subject/LAR expressed continued interest and consent in continuing as a study subject. Subject thanked for participation in research and contribution to science.  Denies any adverse events. Says no change in cough/symptoms.    Allergies  Allergen Reactions   Penicillins Other (See Comments)    As a child    Immunization History  Administered Date(s) Administered   Fluad Quad(high Dose 65+) 12/23/2018, 02/23/2020, 02/20/2021   Influenza, Seasonal, Injecte, Preservative Fre 04/01/2012   Influenza,inj,Quad PF,6+ Mos 02/28/2013, 01/25/2014,  03/14/2015, 02/27/2017   Influenza-Unspecified 03/06/2016, 01/28/2018   Moderna Covid-19 Vaccine Bivalent Booster 76yr & up 05/10/2021   Moderna SARS-COV2 Booster Vaccination 03/09/2020, 11/14/2020   Moderna Sars-Covid-2 Vaccination 05/26/2019, 06/28/2019   Pneumococcal Conjugate-13 12/23/2018   Pneumococcal Polysaccharide-23 03/01/2020   Tdap 05/16/2014   Zoster, Live 02/01/2015    Past Medical History:  Diagnosis Date   DDD (degenerative disc disease), lumbar    GERD (gastroesophageal reflux disease)    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 Right Knee   Tinea versicolor     Tobacco History: Social History   Tobacco Use  Smoking Status Never  Smokeless Tobacco Former   Types: Chew  Tobacco Comments   admits do doing chewing tabacco. stopped in 2005   Counseling given: Not Answered Tobacco comments: admits do doing chewing tabacco. stopped in 2005   Outpatient Medications Prior to Visit  Medication Sig Dispense Refill   albuterol (ACCUNEB) 1.25 MG/3ML nebulizer solution Please specify directions, refills and quantity 120 mL 5   bisoprolol (ZEBETA) 5 MG tablet Take 0.5 tablets (2.5 mg total) by mouth daily. 45 tablet 3   ENTRESTO 24-26 MG Take 1 tablet by mouth 2 (two) times daily. 180 tablet 3   HYDROcodone bit-homatropine (HYCODAN) 5-1.5 MG/5ML syrup Take 5 mLs by mouth every 6 (six) hours as needed for cough. 240 mL 0  ipratropium (ATROVENT) 0.03 % nasal spray Place 2 sprays into both nostrils every 12 (twelve) hours. 30 mL 12   Multiple Vitamin (MULTIVITAMIN) tablet Take 1 tablet by mouth daily. [facility]/ one a day men complete 50 +     Multiple Vitamins-Minerals (PRESERVISION AREDS 2+MULTI VIT PO) Take 1 tablet 2 (two) times daily by  mouth.     omeprazole (PRILOSEC OTC) 20 MG tablet Take 20 mg by mouth daily.     OVER THE COUNTER MEDICATION Take 65 mg by mouth daily. CBD 1 dropper full     Pirfenidone (ESBRIET) 801 MG TABS Take 1 tablet (801 mg) by mouth 3 (three) times daily with meals. 270 tablet 1   sildenafil (REVATIO) 20 MG tablet TAKE 3 TO 5 TABLETS BY MOUTH ONCE DAILY AS NEEDED 50 tablet 0   spironolactone (ALDACTONE) 25 MG tablet TAKE 1/2 TABLET BY MOUTH EVERY DAY 45 tablet 2   terbinafine (LAMISIL) 1 % cream Apply 1 application topically daily as needed (Jock itch).     valACYclovir (VALTREX) 500 MG tablet Take 500 mg by mouth as needed (Cold sores).     No facility-administered medications prior to visit.      Physical Exam See research exam    Lab Results:        Latest Ref Rng & Units 06/20/2021    8:45 AM 01/29/2021   12:49 PM 09/10/2020   11:50 AM 06/21/2020   11:55 AM  PFT Results  FVC-Pre L 3.09  2.94  3.01  2.96   FVC-Predicted Pre % 72  69  70  68   FVC-Post L 3.14  2.99  2.99  2.87   FVC-Predicted Post % 73  70  69  66   Pre FEV1/FVC % % 93  94  94  93   Post FEV1/FCV % % 94  96  94  95   FEV1-Pre L 2.89  2.78  2.84  2.75   FEV1-Predicted Pre % 92  88  89  86   FEV1-Post L 2.95  2.86  2.81  2.74   DLCO uncorrected ml/min/mmHg 19.67  18.85  26.58  22.53   DLCO UNC% % 78  74  105  89   DLCO corrected ml/min/mmHg 19.67  18.49  26.58  22.53   DLCO COR %Predicted % 78  73  105  89   DLVA Predicted % 105  106  136  144   TLC L 4.60  4.45  4.64  4.02   TLC % Predicted % 67  65  68  58   RV % Predicted % 63  65  67  50     No results found for: "NITRICOXIDE"      Assessment & Plan:   ILD (interstitial lung disease) (HCC) Continue current protocol   Research study patient Continue per study protocol      Rexene Edison, NP 11/12/2021

## 2021-11-12 NOTE — Assessment & Plan Note (Signed)
Continue current protocol  

## 2021-11-12 NOTE — Research (Signed)
Title: A phase 2, double-blind, randomized, placebo controlled, two period cross-over study to evaluate the efficacy and safety of orvepitant in chronic cough in patients with idiopathic pulmonary fibrosis; IPF  Primary Endpoint: Mean change from Baseline (the last 7 days prior to randomization) to Week 4 (the last 7 days of treatment) in weekly average of the daily IPF Coughing Severity Scale  Protocol # / Study Name: ORV-PF-01 / IPF COMFORT Clinical Trials #: [id number] Sponsor: [place]   [facility] [place] Coordinator note :   This visit for Subject William Curry with DOB: 09-Nov-1950 on 11/12/2021 for the above protocol is Visit/Encounter # Visit 8 and is for purpose of research.   The consent for this encounter is under Protocol Version: V3.0, approved 05Apr2023 IB:  V10.0, approved [id number] ICF: approved [id number]  Subject expressed continued interest and consent in continuing as a study subject. Subject confirmed that there was no change in contact information (e.g. address, telephone, email). Subject thanked for participation in research and contribution to science.  In this visit 11/12/2021 the subject will be evaluated by Sub-Investigator named Tammy Parrett. This research coordinator has verified that the above investigator is up to date with her training logs.   The Subject was informed that the PI Dr. Brand Males continues to have oversight of the subject's visits and course  through relevant discussions, reviews and also specifically of this visit by routing of this note to the [place].  The patient appeared well today. He reported no new complaints to his health. He stated he has not observed any improvement or worsening of his chronic cough since starting the clinical trial. Sub-investigator Tammy Parrett conducted the physical exam. Visit assessments were completed per protocol. Please refer to paper source subject binder for further  details.      Signed by  Lytle Michaels Clinical Research Coordinator  [facility]  [place], [place] 11:23 AM 11/12/2021

## 2021-11-12 NOTE — Assessment & Plan Note (Signed)
Continue per study protocol 

## 2021-11-19 ENCOUNTER — Other Ambulatory Visit: Payer: Self-pay | Admitting: Internal Medicine

## 2021-11-28 ENCOUNTER — Other Ambulatory Visit: Payer: Self-pay | Admitting: Pulmonary Disease

## 2021-11-28 ENCOUNTER — Other Ambulatory Visit: Payer: Self-pay | Admitting: Internal Medicine

## 2021-12-17 ENCOUNTER — Encounter: Payer: Self-pay | Admitting: Pulmonary Disease

## 2021-12-17 ENCOUNTER — Ambulatory Visit: Payer: 59 | Admitting: Pulmonary Disease

## 2021-12-17 VITALS — BP 104/62 | HR 69 | Temp 97.9°F | Ht 69.0 in | Wt 150.6 lb

## 2021-12-17 DIAGNOSIS — J84112 Idiopathic pulmonary fibrosis: Secondary | ICD-10-CM

## 2021-12-17 MED ORDER — ALBUTEROL SULFATE 1.25 MG/3ML IN NEBU: 1.0000 | INHALATION_SOLUTION | Freq: Four times a day (QID) | RESPIRATORY_TRACT | 11 refills | Status: AC | PRN

## 2021-12-17 NOTE — Patient Instructions (Addendum)
Use ipratropium nasal spray, 2 sprays per nostril twice daily as needed.   Try 7.65m of the hycodan cough syrup or 137mand monitor for any improvement in the cough  Use albuterol nebulizer treatments twice daily as needed  We will check CT Chest scan in December  Follow up in 6 months with PFTs

## 2021-12-17 NOTE — Progress Notes (Signed)
Synopsis: Referred in 04/2020 for chronic cough by Dr. Lamar Blinks, MD  Subjective:   PATIENT ID: William Curry GENDER: male DOB: 1950/11/28, MRN: [id number]  HPI  Chief Complaint  Patient presents with   Follow-up    Breathing is unchanged. He c/o increased cough for the past few months- prod with white to clear sputum.    William Curry is a 71 year old male, never smoker with with history of GERD who returns to pulmonary clinic for follow up of pulmonary fibrosis.   He has been working with our clinical trial team and was recently nvolved in ORV-PF-01 / IPF COMFORT trial investigating cough in IPF patients. He did not notice any improvement in his cough symptoms.   He feels the cough is more productive at this time. He is spitting up more phlegm which is clear to milky color. The cough will wake him up once in a while at night. He is using ipratropium nasal spray. He is using listerine mouth spray which helps with the cough. He did not find relief from sucking on sugar free candies. He is experiencing popping/cracking of his jaw with wide opening of his mouth.   He has lost 10lbs since March.   OV 06/20/21 He continues to do well on esbriet. He reports a 7lbs weight loss since January due to lack of appetite. He reports his cough is worse since restarting the entresto.  He tried flonase for sinus drainage with no improvement.  They are not planning to move to [place] for another year now.  PFTs today remain stable.  OV 03/21/21 He started with Esbriet therapy on 02/07/2021 and has not tolerated increasing to 3 tabs 3 times per day.  He does report dizziness 30 minutes after taking Esbriet.  He has not checked his blood pressure during these times.  He has been holding losartan or Entresto.  He also reports developing some itchy areas of skin since starting esbriet. He is using CeraVe cream which helps.   He reports his cough is about 25% improved since last visit as we  discussed holding Entresto for possible contribution to his cough symptoms.  He has been holding his fluticasone nasal spray due to issues with epistaxis.  He and his wife are planning to move back to [place], [place] in May 2023.  OV 01/29/21 He reports his cough has progressed since last visit. Remains dry with occasional sputum production. He was started on entresto in August. He was trying menthol cough candies without relief.  He had repeat PFTs today which show a decline in DLCO to 18.49 from 22 and 26 previously.   OV 10/18/20 Repeat PFTs on 09/10/20 showed stability in his lung function with mild restrictive defect. DLCO remains normal.   He has been approved for esbriet and has meet with our pharmacy team about the benefits and risks of the medication as outlined in there meeting on 09/10/20.  He wishes to currently hold off on this therapy at this time until later this fall as he wants to avoid any of the side effects this summer.  He has had an increase in cough symptoms with sputum production since last visit.  He attributes this to his recent travels to the [facility] and changes in the climate from cold to hot weather.  He denies any shortness of breath.  His wife has accompanied him on today's visit.  He brought a copy of his mother's death certificate which had idiopathic pulmonary fibrosis  listed as the cause of death.  He reports she was a very heavy smoker.  OV 08/27/2020: His HRCT Chest on 05/14/20 showed subpleural reticulation, ground-glass, traction bronchiectasis/bronchiolectasis and probable honeycombing with upper/midlung involvement.   His PFTs 06/21/20 show moderate restrictive defect with normal DLCO.   Inflammatory markers and myositis panel are negative.  We reviewed his clinical history, HRCT chest and PFTs at multidisciplinary ILD conference in April 2022 with a recommendation to initiate anti-fibrotic therapy as his findings are concerning for pulmonary  fibrosis.    He continues to experience cough with occasional clear/milky colored sputum. The nasal sprays have helped somehwat, he continues to use flonase. He has decreased his dose of pantoprazoled from '40mg'$  daily to '20mg'$  daily with no increase in his reflux symptoms.   OV 05/01/20: He reports having a cough since 2017 that has progressively worsened over the last 1 year. He does produce clear mucous at times. Has never cough up blood. He has been evaluated by GI and ENT for this cough in the past. He was started on pantoprazole over 1 year ago and he has not noticed any improvement in his cough, which he reports has worsened over the last year. He had laryngoscopy performed by ENT 10/10/19 which was unremarkable.   He denies any shortness of breath or wheezing with the cough. He denies overt heart burn or reflux symptoms. He denies seasonal allergies, sinus congestion or drainage.   He is a never smoker. He does have exposure to [employer] as he worked for [employer]. 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    He has a nephew with cystic fibrosis.  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 Right Knee   Tinea versicolor      Family History  Problem Relation Age of Onset   Cancer Mother 83       Breast   Diabetes Mother 39       on insulin   Breast cancer Mother    Diabetes Father 62       on insulin   Bladder Cancer Father    Colon polyps Sister    Colon cancer Maternal Grandfather    Esophageal  cancer Neg Hx    Rectal cancer Neg Hx    Stomach cancer Neg Hx    Prostate cancer Neg Hx      Social History   Socioeconomic History   Marital status: Married    Spouse name: Not on file   Number of children: Not on file   Years of education: Not on file   Highest education level: Not on file  Occupational History   Occupation: Insurance underwriter / Accountant  Tobacco Use   Smoking status: Never   Smokeless tobacco: Former    Types: Chew   Tobacco comments:    admits do doing chewing tabacco. 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   To [place] 2013   Remarried 2007   [employer] [place] '71-'75   Social Determinants of Health   Financial Resource Strain: Not on file  Food Insecurity: Not on file  Transportation Needs: Not on file  Physical Activity: Not on file  Stress: Not on file  Social Connections: Not on file  Intimate Partner Violence: Not on file     Allergies  Allergen Reactions   Penicillins Other (See Comments)    As a child     Outpatient Medications Prior to Visit  Medication Sig Dispense Refill   bisoprolol (ZEBETA) 5 MG tablet TAKE 1/2 TABLET BY MOUTH DAILY 15 tablet 2   ENTRESTO 24-26 MG Take 1 tablet by mouth 2 (two) times daily. 180 tablet 3   HYDROcodone bit-homatropine (HYCODAN) 5-1.5 MG/5ML syrup Take 5 mLs by mouth every 6 (six) hours as needed for cough. 240 mL 0   ipratropium (ATROVENT) 0.03 % nasal spray Place 2 sprays into both nostrils every 12 (twelve) hours. 30 mL 12   Multiple Vitamin (MULTIVITAMIN) tablet Take 1 tablet by mouth daily. [facility]/ one a day men complete 50 +     Multiple Vitamins-Minerals (PRESERVISION AREDS 2+MULTI VIT PO) Take 1 tablet 2 (two)  times daily by mouth.     omeprazole (PRILOSEC OTC) 20 MG tablet Take 20 mg by mouth daily.     OVER THE COUNTER MEDICATION Take 65 mg by mouth daily. CBD 1 dropper full     Pirfenidone (ESBRIET) 801 MG TABS Take 1 tablet (801 mg) by mouth 3 (three) times daily with meals. 270 tablet 1   sildenafil (REVATIO) 20 MG tablet TAKE 3 TO 5 TABLETS BY MOUTH ONCE DAILY AS NEEDED 50 tablet 0   spironolactone (ALDACTONE) 25 MG tablet TAKE 1/2 TABLET BY MOUTH EVERY DAY 45 tablet 2   terbinafine (LAMISIL) 1 % cream Apply 1 application topically daily as needed (Jock itch).     valACYclovir (VALTREX) 500 MG tablet Take 500 mg by mouth as needed (Cold sores).     albuterol (ACCUNEB) 1.25 MG/3ML nebulizer solution Take 1 ampule by nebulization every 6 (six) hours as needed for wheezing.     albuterol (ACCUNEB) 1.25 MG/3ML nebulizer solution Please specify directions, refills and quantity (Patient taking differently: 1 ampule every 4 (four) hours as needed.) 120 mL 5   No facility-administered medications prior to visit.    Review of Systems  Constitutional:  Negative for chills, fever, malaise/fatigue and weight loss.  HENT:  Negative for congestion, sinus pain and sore throat.   Eyes: Negative.   Respiratory:  Positive for cough and sputum production. Negative for hemoptysis, shortness of breath and wheezing.   Cardiovascular:  Negative for chest pain, palpitations, orthopnea, claudication and leg swelling.  Gastrointestinal:  Negative for abdominal pain, heartburn, nausea and vomiting.  Genitourinary: Negative.   Musculoskeletal:  Negative for joint pain and myalgias.  Skin:  Negative for rash.  Neurological:  Negative for weakness.  Endo/Heme/Allergies: Negative.   Psychiatric/Behavioral: Negative.  The patient is not nervous/anxious.     Objective:   Vitals:   12/17/21 0854  BP: 104/62  Pulse: 69  Temp: 97.9 F (36.6 C)  TempSrc: Oral  SpO2: 98%  Weight: 150 lb 9.6 oz (68.3 kg)  Height:  '5\' 9"'$  (1.753 m)   Physical Exam Constitutional:      General: He is not in acute distress. HENT:     Head: Normocephalic and atraumatic.  Eyes:     Conjunctiva/sclera: Conjunctivae normal.  Cardiovascular:     Rate and Rhythm: Normal rate and regular rhythm.     Pulses: Normal pulses.     Heart sounds: Normal heart sounds. No murmur heard. Pulmonary:     Effort: Pulmonary effort is normal.     Breath sounds: Rales (bibasilar) present. No wheezing or rhonchi.  Musculoskeletal:     Right lower leg: No edema.     Left lower leg: No edema.  Skin:    General: Skin is warm and dry.  Neurological:     General: No focal deficit present.     Mental Status: He is alert.    CBC    Component Value Date/Time   WBC 7.2 01/10/2021 1035   WBC 5.6 03/01/2020 0817   RBC 4.76 01/10/2021 1035   RBC 4.68 03/01/2020 0817   HGB 15.3 01/10/2021 1035   HCT 44.5 01/10/2021 1035   PLT 193 01/10/2021 1035   MCV 94 01/10/2021 1035   MCH 32.1 01/10/2021 1035   MCHC 34.4 01/10/2021 1035   MCHC 33.7 03/01/2020 0817   RDW 13.1 01/10/2021 1035      Latest Ref Rng & Units 01/24/2021   11:43 AM 01/10/2021   10:35 AM 12/14/2020    1:58 PM  BMP  Glucose 70 - 99 mg/dL 91  98  91   BUN 8 - 27 mg/dL '16  21  16   '$ Creatinine 0.76 - 1.27 mg/dL 0.93  0.91  1.00   BUN/Creat Ratio 10 - '24 17  23  16   '$ Sodium 134 - 144 mmol/L 139  139  139   Potassium 3.5 - 5.2 mmol/L 4.7  4.7  4.6   Chloride 96 - 106 mmol/L 100  102  101   CO2 20 - 29 mmol/L '26  29  25   '$ Calcium 8.6 - 10.2 mg/dL 9.3  9.2  9.4    Chest imaging: HRCT Chest 03/27/21 1. No significant change in moderate pulmonary fibrosis in a pattern without clear apical to basal gradient featuring irregular peripheral interstitial opacity, septal thickening, and areas of subpleural bronchiolectasis throughout. Mild varicoid and traction bronchiectasis. Findings are categorized as probable UIP per consensus guidelines: Diagnosis of Idiopathic Pulmonary  Fibrosis: An Official ATS/ERS/JRS/ALAT Clinical Practice Guideline. [place], Iss 5, [phone], Dec 20 2016. 2. Unchanged prominent mediastinal and bilateral hilar lymph nodes, some of which are calcified, nonspecific although likely reactive to fibrosis. Calcification may be sequelae of prior granulomatous infection. 3. Cardiomegaly. 4. Enlargement of the main pulmonary artery, as can be seen in pulmonary hypertension.  HRCT Chest 05/14/20 1. Pulmonary parenchymal pattern of fibrosis may be due to nonspecific interstitial pneumonitis. Usual interstitial pneumonitis is not excluded. Findings are indeterminate for UIP per consensus guidelines: Diagnosis of Idiopathic Pulmonary Fibrosis: An Official ATS/ERS/JRS/ALAT Clinical Practice Guideline. [place], Iss 5, [phone], Dec 20 2016. 2. Small to borderline enlarged mediastinal lymph nodes can be seen in the setting of interstitial lung disease. 3. Right renal stone.  Chest Radiograph 05/04/2018 Cardiac shadow is enlarged. Pacing device is noted. The lungs are  well aerated bilaterally with some minimal interstitial changes. No focal confluent infiltrate or effusion is seen. No bony abnormality is noted.  PFT:    Latest Ref Rng & Units 06/20/2021    8:45 AM 01/29/2021   12:49 PM 09/10/2020   11:50 AM 06/21/2020   11:55 AM  PFT Results  FVC-Pre L 3.09  2.94  3.01  2.96   FVC-Predicted Pre % 72  69  70  68   FVC-Post L 3.14  2.99  2.99  2.87   FVC-Predicted Post % 73  70  69  66   Pre FEV1/FVC % % 93  94  94  93   Post FEV1/FCV % % 94  96  94  95   FEV1-Pre L 2.89  2.78  2.84  2.75   FEV1-Predicted Pre % 92  88  89  86   FEV1-Post L 2.95  2.86  2.81  2.74   DLCO uncorrected ml/min/mmHg 19.67  18.85  26.58  22.53   DLCO UNC% % 78  74  105  89   DLCO corrected ml/min/mmHg 19.67  18.49  26.58  22.53   DLCO COR %Predicted % 78  73  105  89   DLVA Predicted % 105  106  136  144   TLC L  4.60  4.45  4.64  4.02   TLC % Predicted % 67  65  68  58   RV % Predicted % 63  65  67  50   PFTs 10/112022: mild restrictive defect and mild diffusion defect     Assessment & Plan:   IPF (idiopathic pulmonary fibrosis) (HCC) - Plan: albuterol (ACCUNEB) 1.25 MG/3ML nebulizer solution  Discussion: William Curry is a 71 year old male, never smoker with with history of GERD who returns to pulmonary clinic for follow up of pulmonary fibrosis.   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  He has mild restrictive defect and normalized DLCO on recent PFTs. He is to continue esbriet '801mg'$  3 times daily.  Cough continues to be his main issue at this time. He is to continue ipratropium nasal spray for post-nasal drip. I instrcuted him to try 7.63m of the hycodan cough syrup or 1621mand monitor for any improvement in the cough as 21m62moes not seem to help. We will consider trial of gabapentin for cough symptoms in the future.  Repeat HRCT Chest ordered for December.   Follow up in 6 months with PFTs.  JonFreda JacksonD [facility] Office: [phone] Outpatient Medications:    bisoprolol (ZEBETA) 5 MG tablet, TAKE 1/2 TABLET BY MOUTH DAILY, Disp: 15 tablet, Rfl: 2   ENTRESTO 24-26 MG, Take 1 tablet by mouth 2 (two) times daily., Disp: 180 tablet, Rfl: 3   HYDROcodone bit-homatropine (HYCODAN) 5-1.5 MG/5ML syrup, Take 5 mLs by mouth every 6 (six) hours as needed for cough., Disp: 240 mL, Rfl: 0   ipratropium (ATROVENT) 0.03 % nasal spray, Place 2 sprays into both nostrils every 12 (twelve) hours., Disp: 30 mL, Rfl: 12   Multiple Vitamin (MULTIVITAMIN) tablet, Take 1 tablet by mouth daily. [facility]/ one a day men complete 50 +, Disp: , Rfl:    Multiple Vitamins-Minerals (PRESERVISION AREDS 2+MULTI VIT PO), Take 1 tablet 2 (two) times daily by mouth., Disp: , Rfl:    omeprazole (PRILOSEC OTC) 20 MG tablet, Take 20 mg by mouth daily., Disp:  , Rfl:    OVER  THE COUNTER MEDICATION, Take 65 mg by mouth daily. CBD 1 dropper full, Disp: , Rfl:    Pirfenidone (ESBRIET) 801 MG TABS, Take 1 tablet (801 mg) by mouth 3 (three) times daily with meals., Disp: 270 tablet, Rfl: 1   sildenafil (REVATIO) 20 MG tablet, TAKE 3 TO 5 TABLETS BY MOUTH ONCE DAILY AS NEEDED, Disp: 50 tablet, Rfl: 0   spironolactone (ALDACTONE) 25 MG tablet, TAKE 1/2 TABLET BY MOUTH EVERY DAY, Disp: 45 tablet, Rfl: 2   terbinafine (LAMISIL) 1 % cream, Apply 1 application topically daily as needed (Jock itch)., Disp: , Rfl:    valACYclovir (VALTREX) 500 MG tablet, Take 500 mg by mouth as needed (Cold sores)., Disp: , Rfl:    albuterol (ACCUNEB) 1.25 MG/3ML nebulizer solution, Take 3 mLs (1.25 mg total) by nebulization every 6 (six) hours as needed for wheezing., Disp: 180 mL, Rfl: 11

## 2022-01-14 ENCOUNTER — Ambulatory Visit (INDEPENDENT_AMBULATORY_CARE_PROVIDER_SITE_OTHER): Payer: 59

## 2022-01-14 DIAGNOSIS — I429 Cardiomyopathy, unspecified: Secondary | ICD-10-CM

## 2022-01-14 LAB — CUP PACEART REMOTE DEVICE CHECK
Battery Remaining Longevity: 70 mo
Battery Remaining Percentage: 87 %
Battery Voltage: 2.98 V
Brady Statistic AP VP Percent: 12 %
Brady Statistic AP VS Percent: 1 %
Brady Statistic AS VP Percent: 87 %
Brady Statistic AS VS Percent: 1 %
Brady Statistic RA Percent Paced: 12 %
Date Time Interrogation Session: 20230926020009
Implantable Lead Implant Date: 20130115
Implantable Lead Implant Date: 20130115
Implantable Lead Implant Date: 20220926
Implantable Lead Location: 753858
Implantable Lead Location: 753859
Implantable Lead Location: 753860
Implantable Pulse Generator Implant Date: 20220926
Lead Channel Impedance Value: 360 Ohm
Lead Channel Impedance Value: 390 Ohm
Lead Channel Impedance Value: 560 Ohm
Lead Channel Pacing Threshold Amplitude: 0.5 V
Lead Channel Pacing Threshold Amplitude: 1 V
Lead Channel Pacing Threshold Amplitude: 1.25 V
Lead Channel Pacing Threshold Pulse Width: 0.4 ms
Lead Channel Pacing Threshold Pulse Width: 0.6 ms
Lead Channel Pacing Threshold Pulse Width: 0.8 ms
Lead Channel Sensing Intrinsic Amplitude: 12 mV
Lead Channel Sensing Intrinsic Amplitude: 2.5 mV
Lead Channel Setting Pacing Amplitude: 1.25 V
Lead Channel Setting Pacing Amplitude: 2 V
Lead Channel Setting Pacing Amplitude: 2.5 V
Lead Channel Setting Pacing Pulse Width: 0.6 ms
Lead Channel Setting Pacing Pulse Width: 1 ms
Lead Channel Setting Sensing Sensitivity: 4 mV
Pulse Gen Model: 3562
Pulse Gen Serial Number: 6509372

## 2022-01-16 ENCOUNTER — Encounter: Payer: Self-pay | Admitting: Internal Medicine

## 2022-01-16 ENCOUNTER — Ambulatory Visit: Payer: 59 | Attending: Internal Medicine | Admitting: Internal Medicine

## 2022-01-16 VITALS — BP 108/62 | HR 77 | Ht 69.0 in | Wt 150.0 lb

## 2022-01-16 DIAGNOSIS — I471 Supraventricular tachycardia: Secondary | ICD-10-CM | POA: Diagnosis not present

## 2022-01-16 DIAGNOSIS — Z950 Presence of cardiac pacemaker: Secondary | ICD-10-CM

## 2022-01-16 DIAGNOSIS — I453 Trifascicular block: Secondary | ICD-10-CM | POA: Diagnosis not present

## 2022-01-16 DIAGNOSIS — I442 Atrioventricular block, complete: Secondary | ICD-10-CM | POA: Diagnosis not present

## 2022-01-16 DIAGNOSIS — I429 Cardiomyopathy, unspecified: Secondary | ICD-10-CM

## 2022-01-16 NOTE — Patient Instructions (Addendum)
Medication Instructions:  Your physician has recommended you make the following change in your medication:   ** Hold Entresto x 2 months to see if your cough improves.  *If you need a refill on your cardiac medications before your next appointment, please call your pharmacy*   Lab Work:  BMET today  If you have labs (blood work) drawn today and your tests are completely normal, you will receive your results only by: [place] (if you have MyChart) OR A paper copy in the mail If you have any lab test that is abnormal or we need to change your treatment, we will call you to review the results.   Testing/Procedures: Your physician has requested that you have an echocardiogram. Echocardiography is a painless test that uses sound waves to create images of your heart. It provides your doctor with information about the size and shape of your heart and how well your heart's chambers and valves are working. This procedure takes approximately one hour. There are no restrictions for this procedure.    Follow-Up: At [facility], you and your health needs are our priority.  As part of our continuing mission to provide you with exceptional heart care, we have created designated Provider Care Teams.  These Care Teams include your primary Cardiologist (physician) and Advanced Practice Providers (APPs -  Physician Assistants and Nurse Practitioners) who all work together to provide you with the care you need, when you need it.  We recommend signing up for the patient portal called "MyChart".  Sign up information is provided on this After Visit Summary.  MyChart is used to connect with patients for Virtual Visits (Telemedicine).  Patients are able to view lab/test results, encounter notes, upcoming appointments, etc.  Non-urgent messages can be sent to your provider as well.   To learn more about what you can do with MyChart, go to [email or web address].    Your next appointment:    3  month telephone visit with Dr Caryl Comes   Important Information About Sugar

## 2022-01-16 NOTE — Progress Notes (Signed)
Patient ID: William Curry, male   DOB: 27-Apr-1950, 71 y.o.   MRN: [id number]      Patient Care Team: Darreld Mclean, MD as PCP - General (Family Medicine) Deboraha Sprang, MD as PCP - Electrophysiology (Cardiology)   HPI  William Curry is a 71 y.o. 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       He states that he has been fine since he received his pace maker and has more energy. His adds that his breathing is about the same.  He suspended the Entresto, which reduced his cough by 25%. He started pirfenidone about the same time so as not sure which was responsible for the reduction in cough   HIs cough is worse and he has resumed his Entresto and has continued to worsen over the last 6 to 8 months.  It is a big nuisance.  No diaphragmatic stimulation mostly on the right side  Dyspnea is chronically limited.  No chest pain no edema    DATE PR QRS Pacing %  2/16     16  4/17 228 138 15  4/18 218 142 10  4/19 250 140 23  8/22 AV220 182 100  12/22 AV174 132 CRT       Date   Cr              K         Hgb   11/18 1.06 4.5    9/20 1.01 4.3         16.1   11/21 0.94 4.4          15.2   9/22              15.3   10/22 0.93 4.7     DATE TEST EF%   01/13 Echo  60-65 %   7/22 Echo  30-35%   8/22 LHC  No obst CAD  12/22 Echo 55%       Past Medical History:  Diagnosis Date   DDD (degenerative disc disease), lumbar    GERD (gastroesophageal reflux disease)    Heart block AV complete ([place]) 2013   Pacemaker   History of chicken pox    Hypertension 2002-2005   Kidney stone 1994-2010   Passed stone in 1994, Lithotripsy in 2010, stones passed in 2018   Macular degeneration    Scoliosis    Skin cancer    Squamous cell carcomina. Right Knee   Tinea versicolor     Past Surgical History:  Procedure Laterality Date   BIV UPGRADE N/A 01/14/2021   Procedure: BIV PPM UPGRADE;  Surgeon: Deboraha Sprang, MD;  Location: [facility];  Service: Cardiovascular;  Laterality: N/A;   CARTILAGE REPAIR  Nov.-1989   Left knee-HARD TO WAKE UP AFTER SX   COLONOSCOPY     KNEE ARTHROSCOPY WITH ANTERIOR CRUCIATE LIGAMENT (ACL) REPAIR  [phone]   Left knee-HARD TO WAKE UP AFTER SX   LEFT HEART CATH AND CORONARY ANGIOGRAPHY N/A 12/18/2020   Procedure: LEFT HEART CATH AND CORONARY ANGIOGRAPHY;  Surgeon: Belva Crome, MD;  Location: [facility];  Service: Cardiovascular;  Laterality: N/A;   LITHOTRIPSY  Jun-2010   Kidney stone (1st stone 1994 - Passed)   PACEMAKER INSTALLATION  Jan-2013   Complete Heart Block   SKIN BIOPSY     RIght knee for skin cancer    Current Meds  Medication Sig   albuterol (ACCUNEB) 1.25 MG/3ML nebulizer solution Take 3 mLs (1.25 mg total) by nebulization every 6 (six) hours as needed for wheezing.   bisoprolol (ZEBETA) 5 MG tablet TAKE 1/2 TABLET BY MOUTH DAILY   ENTRESTO 24-26 MG Take 1 tablet by mouth 2 (two) times daily.   ipratropium (ATROVENT) 0.03 % nasal spray Place 2 sprays into both nostrils every 12 (twelve) hours.   Multiple Vitamin (MULTIVITAMIN) tablet Take 1 tablet by mouth daily. [facility]/ one a day men complete 50 +   Multiple Vitamins-Minerals (PRESERVISION AREDS 2+MULTI VIT PO) Take 1 tablet 2 (two) times daily by mouth.   omeprazole (PRILOSEC OTC) 20 MG tablet Take 20 mg by mouth daily.   OVER THE COUNTER MEDICATION Take 65 mg by mouth daily. CBD 1 dropper full   Pirfenidone (ESBRIET) 801 MG TABS Take 1 tablet (801 mg) by mouth 3 (three) times daily with meals.   sildenafil (REVATIO) 20 MG tablet TAKE 3 TO 5 TABLETS BY MOUTH ONCE DAILY AS NEEDED   spironolactone (ALDACTONE) 25 MG tablet TAKE 1/2 TABLET BY MOUTH EVERY DAY   terbinafine (LAMISIL) 1 % cream Apply 1 application topically daily as needed (Jock itch).   triamcinolone cream (KENALOG) 0.1 % Apply topically.   valACYclovir (VALTREX) 500 MG tablet Take 500 mg by mouth as needed  (Cold sores).    Allergies  Allergen Reactions   Penicillins Other (See Comments)    As a child    Review of Systems negative except from HPI and PMH Please see the history of present illness. (+) Cough All other systems are reviewed and negative.     Physical Exam: BP 108/62   Pulse 77   Ht '5\' 9"'$  (1.753 m)   Wt 150 lb (68 kg)   SpO2 97%   BMI 22.15 kg/m  Well developed and well nourished in no acute distress HENT normal Neck supple with JVP-flat Clear Device pocket well healed; without hematoma or erythema.  There is no tethering  Regular rate and rhythm, no  gallop No murmur Abd-soft with active BS No Clubbing cyanosis  edema Skin-warm and dry A & Oriented  Grossly normal sensory and motor function  ECG sinus P-synchronous/ AV  pacing  Upright QRS V1 and neg 1    04/18/2021 ECG: Sinus with P synchronous pacing at a rate of 72 Interval 17/13/45 interval QRS negative lead I and RS in lead V1.  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  We will continue current program; his capture threshold is right at programming will increase output on the LV lead by 0.25 V.  He has diaphragmatic stimulation in certain positions will hopefully avoid it.  We discussed that  alternatives to reprogramming for diaphragmatic stimulation would be procedural either endovascular lead repositioning or epicardial lead placement  His cough is considerably worsened over the last month on Entresto.  We will hold his Entresto.  There are data suggesting that cardiomyopathies can worsen following discontinuation of these medications; this is particular patients were not paced, I do not know if it applies to people who are paced.  He would also be a candidate for an  SGLT2.  We will check a metabolic profile.  Lengthy discussion regarding the mechanisms of  medications

## 2022-01-17 LAB — BASIC METABOLIC PANEL
BUN/Creatinine Ratio: 24 (ref 10–24)
BUN: 22 mg/dL (ref 8–27)
CO2: 26 mmol/L (ref 20–29)
Calcium: 9.5 mg/dL (ref 8.6–10.2)
Chloride: 98 mmol/L (ref 96–106)
Creatinine, Ser: 0.91 mg/dL (ref 0.76–1.27)
Glucose: 92 mg/dL (ref 70–99)
Potassium: 4.3 mmol/L (ref 3.5–5.2)
Sodium: 139 mmol/L (ref 134–144)
eGFR: 90 mL/min/{1.73_m2} (ref >59)

## 2022-01-24 NOTE — Progress Notes (Signed)
Remote pacemaker transmission.   

## 2022-01-31 ENCOUNTER — Encounter: Payer: Self-pay | Admitting: Internal Medicine

## 2022-02-03 ENCOUNTER — Ambulatory Visit (HOSPITAL_COMMUNITY): Payer: 59 | Attending: Internal Medicine

## 2022-02-03 DIAGNOSIS — I442 Atrioventricular block, complete: Secondary | ICD-10-CM | POA: Diagnosis not present

## 2022-02-03 DIAGNOSIS — I429 Cardiomyopathy, unspecified: Secondary | ICD-10-CM | POA: Diagnosis not present

## 2022-02-03 DIAGNOSIS — I471 Supraventricular tachycardia, unspecified: Secondary | ICD-10-CM | POA: Diagnosis not present

## 2022-02-03 LAB — ECHOCARDIOGRAM COMPLETE
Area-P 1/2: 3.08 cm2
S' Lateral: 3.3 cm

## 2022-02-17 ENCOUNTER — Telehealth: Payer: Self-pay | Admitting: Pharmacist

## 2022-02-17 NOTE — Telephone Encounter (Signed)
Received fax from [facility] requesting new rx for pirfenidone.  Dose: '801mg'$  three times daily  He is not aware if his LFTs have been checked since December 2022. Will hold on sending refill until his PCP appt on 02/26/22 States he will have all labs checked then.  Knox Saliva, PharmD, MPH, BCPS, CPP Clinical Pharmacist (Rheumatology and Pulmonology)

## 2022-02-19 ENCOUNTER — Encounter: Payer: Self-pay | Admitting: Pulmonary Disease

## 2022-02-20 ENCOUNTER — Other Ambulatory Visit: Payer: Self-pay | Admitting: *Deleted

## 2022-02-20 DIAGNOSIS — J84112 Idiopathic pulmonary fibrosis: Secondary | ICD-10-CM

## 2022-02-20 DIAGNOSIS — R053 Chronic cough: Secondary | ICD-10-CM

## 2022-02-23 NOTE — Progress Notes (Unsigned)
[facility] at [facility] [address], [place], [place] [address] [phone]  Date:  02/26/2022   Name:  William Curry   DOB:  Jul 07, 1950   MRN:  [id number]  PCP:  Darreld Mclean, MD    Chief Complaint: No chief complaint on file.   History of Present Illness:  William Curry is a 71 y.o. very pleasant male patient who presents with the following:  Patient seen today for physical exam Most recent visit with myself about 1 year ago- - history of complete AV block with pacemaker, ED, GERD, hypertension, macular degeneration, chronic tinea versicolor, idiopathic pulmonary fibrosis Today patient shared with me he has been told his pulmonary fibrosis is terminal and he can likely expect to live another 3 to 5 years Married to [place] 4 children and 6 grandchildren in [place] and one stepson/2 grandchildren locally His son [name] died 2020/01/18 due to pneumonia and heart issues  Flu shot  He was seen by Dr. Caryl Comes with electrophysiology in 01-18-2023 He had his pacemaker upgraded last year.  From most recent note: Cardiomyopathy is considerably improved.  We will continue current program; his capture threshold is right at programming will increase output on the LV lead by 0.25 V.  He has diaphragmatic stimulation in certain positions will hopefully avoid it.  We discussed that  alternatives to reprogramming for diaphragmatic stimulation would be procedural either endovascular lead repositioning or epicardial lead placement   His cough is considerably worsened over the last month on Entresto.  We will hold his Entresto.  There are data suggesting that cardiomyopathies can worsen following discontinuation of these medications; this is particular patients were not paced, I do not know if it applies to people who are paced.  He would also be a candidate for an SGLT2.  He was seen by his pulmonologist, Dr. Erin Fulling in August:      Assessment & Plan:    IPF (idiopathic pulmonary fibrosis) ([place]) - Plan: albuterol (ACCUNEB) 1.25 MG/3ML nebulizer solution   Discussion: William Curry is a 71 year old male, never smoker with with history of GERD who returns to pulmonary clinic for follow up of pulmonary fibrosis.  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  He has mild restrictive defect and normalized DLCO on recent PFTs. He is to continue esbriet '801mg'$  3 times daily. Cough continues to be his main issue at this time. He is to continue ipratropium nasal spray for post-nasal drip. I instrcuted him to try 7.68m of the hycodan cough syrup or 143mand monitor for any improvement in the cough as 72m57moes not seem to help. We will consider trial of gabapentin for cough symptoms in the future. Repeat HRCT Chest ordered for December.  Follow up in 6 months with PFTs.      Shingrix Recommend COVID booster Flu shot Colonoscopy due next year if patient wishes to have done  Albuterol nebulizer Bisoprolol 2.5 daily Entresto-?  Not taking Pirfenidone Sildenafil as needed Spironolactone 12.5 Patient Active Problem List   Diagnosis Date Noted   Research study patient 11/09/00/5427Chronic systolic heart failure ([facility]  ILD (interstitial lung disease) ([facility]5/30/2022   Atrial tachycardia 05/06/2020   Trifascicular block 05/06/2020   Grief at loss of child 02/23/2020   Knee pain 03/02/2017   Insomnia 06/11/2015   Erectile dysfunction 06/11/2015   Medication monitoring encounter 05/17/2014   GERD (gastroesophageal reflux  disease) 05/17/2014   Atrioventricular block, complete--intermittent 06/13/2013   Elevated blood pressure 06/13/2013   Pacemaker-[employer] Judes 05/04/2012   LBBB (left bundle branch block) 04/02/2012   Tinea versicolor 04/02/2012   DDD (degenerative disc disease), lumbar 04/02/2012    Past Medical History:  Diagnosis Date   DDD (degenerative disc disease), lumbar    GERD  (gastroesophageal reflux disease)    Heart block AV complete ([place]) 2013   Pacemaker   History of chicken pox    Hypertension 2002-2005   Kidney stone 1994-2010   Passed stone in 1994, Lithotripsy in 2010, stones passed in 2018   Macular degeneration    Scoliosis    Skin cancer    Squamous cell carcomina. Right Knee   Tinea versicolor     Past Surgical History:  Procedure Laterality Date   BIV UPGRADE N/A 01/14/2021   Procedure: BIV PPM UPGRADE;  Surgeon: Deboraha Sprang, MD;  Location: [facility];  Service: Cardiovascular;  Laterality: N/A;   CARTILAGE REPAIR  Nov.-1989   Left knee-HARD TO WAKE UP AFTER SX   COLONOSCOPY     KNEE ARTHROSCOPY WITH ANTERIOR CRUCIATE LIGAMENT (ACL) REPAIR  [phone]   Left knee-HARD TO WAKE UP AFTER SX   LEFT HEART CATH AND CORONARY ANGIOGRAPHY N/A 12/18/2020   Procedure: LEFT HEART CATH AND CORONARY ANGIOGRAPHY;  Surgeon: Belva Crome, MD;  Location: [facility];  Service: Cardiovascular;  Laterality: N/A;   LITHOTRIPSY  Jun-2010   Kidney stone (1st stone 1994 - Passed)   PACEMAKER INSTALLATION  Jan-2013   Complete Heart Block   SKIN BIOPSY     RIght knee for skin cancer    Social History   Tobacco Use   Smoking status: Never   Smokeless tobacco: Former    Types: Chew   Tobacco comments:    admits do doing chewing tabacco. stopped in 2005  Vaping Use   Vaping Use: Never used  Substance Use Topics   Drug use: No    Family History  Problem Relation Age of Onset   Cancer Mother 21       Breast   Diabetes Mother 8       on insulin   Breast cancer Mother    Diabetes Father 65       on insulin   Bladder Cancer Father    Colon polyps Sister    Colon cancer Maternal Grandfather    Esophageal cancer Neg Hx    Rectal cancer Neg Hx    Stomach cancer Neg Hx    Prostate cancer Neg Hx     Allergies  Allergen Reactions   Penicillins Other (See Comments)    As a child    Medication list has been reviewed and  updated.  Current Outpatient Medications on File Prior to Visit  Medication Sig Dispense Refill   albuterol (ACCUNEB) 1.25 MG/3ML nebulizer solution Take 3 mLs (1.25 mg total) by nebulization every 6 (six) hours as needed for wheezing. 180 mL 11   bisoprolol (ZEBETA) 5 MG tablet TAKE 1/2 TABLET BY MOUTH DAILY 15 tablet 2   ENTRESTO 24-26 MG Take 1 tablet by mouth 2 (two) times daily. 180 tablet 3   ipratropium (ATROVENT) 0.03 % nasal spray Place 2 sprays into both nostrils every 12 (twelve) hours. 30 mL 12   Multiple Vitamin (MULTIVITAMIN) tablet Take 1 tablet by mouth daily. [facility]/ one a day men complete 50 +     Multiple Vitamins-Minerals (PRESERVISION AREDS 2+MULTI  VIT PO) Take 1 tablet 2 (two) times daily by mouth.     omeprazole (PRILOSEC OTC) 20 MG tablet Take 20 mg by mouth daily.     OVER THE COUNTER MEDICATION Take 65 mg by mouth daily. CBD 1 dropper full     Pirfenidone (ESBRIET) 801 MG TABS Take 1 tablet (801 mg) by mouth 3 (three) times daily with meals. 270 tablet 1   sildenafil (REVATIO) 20 MG tablet TAKE 3 TO 5 TABLETS BY MOUTH ONCE DAILY AS NEEDED 50 tablet 0   spironolactone (ALDACTONE) 25 MG tablet TAKE 1/2 TABLET BY MOUTH EVERY DAY 45 tablet 2   terbinafine (LAMISIL) 1 % cream Apply 1 application topically daily as needed (Jock itch).     triamcinolone cream (KENALOG) 0.1 % Apply topically.     valACYclovir (VALTREX) 500 MG tablet Take 500 mg by mouth as needed (Cold sores).     No current facility-administered medications on file prior to visit.    Review of Systems:  As per HPI- otherwise negative.   Physical Examination: There were no vitals filed for this visit. There were no vitals filed for this visit. There is no height or weight on file to calculate BMI. Ideal Body Weight:    GEN: no acute distress. HEENT: Atraumatic, Normocephalic.  Ears and Nose: No external deformity. CV: RRR, No M/G/R. No JVD. No thrill. No extra heart sounds. PULM: CTA B, no  wheezes, crackles, rhonchi. No retractions. No resp. distress. No accessory muscle use. ABD: S, NT, ND, +BS. No rebound. No HSM. EXTR: No c/c/e PSYCH: Normally interactive. Conversant.    Assessment and Plan: *** Physical exam today.  Encouraged healthy diet and exercise routine Signed Lamar Blinks, MD

## 2022-02-23 NOTE — Patient Instructions (Incomplete)
It was great to see you again today-I will be in touch with your labs soon as possible  I recommend getting a dose of the latest COVID-19 booster and RSV this fall Also consider getting the Shingrix vaccine series Flu shot today Best of luck with your move back to [place]- we will miss you but I hope you love being closer to your family!

## 2022-02-26 ENCOUNTER — Ambulatory Visit (INDEPENDENT_AMBULATORY_CARE_PROVIDER_SITE_OTHER): Payer: 59 | Admitting: Family Medicine

## 2022-02-26 ENCOUNTER — Encounter: Payer: Self-pay | Admitting: Family Medicine

## 2022-02-26 ENCOUNTER — Other Ambulatory Visit: Payer: Self-pay | Admitting: Pharmacist

## 2022-02-26 VITALS — BP 110/62 | HR 77 | Temp 97.6°F | Resp 18 | Ht 69.0 in

## 2022-02-26 DIAGNOSIS — E785 Hyperlipidemia, unspecified: Secondary | ICD-10-CM | POA: Diagnosis not present

## 2022-02-26 DIAGNOSIS — Z125 Encounter for screening for malignant neoplasm of prostate: Secondary | ICD-10-CM | POA: Diagnosis not present

## 2022-02-26 DIAGNOSIS — Z5181 Encounter for therapeutic drug level monitoring: Secondary | ICD-10-CM

## 2022-02-26 DIAGNOSIS — Z0000 Encounter for general adult medical examination without abnormal findings: Secondary | ICD-10-CM

## 2022-02-26 DIAGNOSIS — Z23 Encounter for immunization: Secondary | ICD-10-CM | POA: Diagnosis not present

## 2022-02-26 DIAGNOSIS — Z950 Presence of cardiac pacemaker: Secondary | ICD-10-CM | POA: Diagnosis not present

## 2022-02-26 DIAGNOSIS — F5101 Primary insomnia: Secondary | ICD-10-CM

## 2022-02-26 DIAGNOSIS — J84112 Idiopathic pulmonary fibrosis: Secondary | ICD-10-CM

## 2022-02-26 DIAGNOSIS — J849 Interstitial pulmonary disease, unspecified: Secondary | ICD-10-CM

## 2022-02-26 LAB — CBC
HCT: 43 % (ref 39.0–52.0)
Hemoglobin: 14.5 g/dL (ref 13.0–17.0)
MCHC: 33.8 g/dL (ref 30.0–36.0)
MCV: 96.8 fl (ref 78.0–100.0)
Platelets: 195 10*3/uL (ref 150.0–400.0)
RBC: 4.45 Mil/uL (ref 4.22–5.81)
RDW: 12.7 % (ref 11.5–15.5)
WBC: 5.9 10*3/uL (ref 4.0–10.5)

## 2022-02-26 LAB — COMPREHENSIVE METABOLIC PANEL
ALT: 14 U/L (ref 0–53)
AST: 18 U/L (ref 0–37)
Albumin: 4.1 g/dL (ref 3.5–5.2)
Alkaline Phosphatase: 50 U/L (ref 39–117)
BUN: 18 mg/dL (ref 6–23)
CO2: 34 mEq/L — ABNORMAL HIGH (ref 19–32)
Calcium: 9.1 mg/dL (ref 8.4–10.5)
Chloride: 102 mEq/L (ref 96–112)
Creatinine, Ser: 0.82 mg/dL (ref 0.40–1.50)
GFR: 88.59 mL/min (ref >60.00)
Glucose, Bld: 88 mg/dL (ref 70–99)
Potassium: 4.5 mEq/L (ref 3.5–5.1)
Sodium: 139 mEq/L (ref 135–145)
Total Bilirubin: 0.9 mg/dL (ref 0.2–1.2)
Total Protein: 6.9 g/dL (ref 6.0–8.3)

## 2022-02-26 LAB — LIPID PANEL
Cholesterol: 141 mg/dL (ref 0–200)
HDL: 46 mg/dL (ref >39.00)
LDL Cholesterol: 84 mg/dL (ref 0–99)
NonHDL: 94.67
Total CHOL/HDL Ratio: 3
Triglycerides: 51 mg/dL (ref 0.0–149.0)
VLDL: 10.2 mg/dL (ref 0.0–40.0)

## 2022-02-26 LAB — PSA: PSA: 2.14 ng/mL (ref 0.10–4.00)

## 2022-02-26 MED ORDER — PIRFENIDONE 801 MG PO TABS: 801.0000 mg | ORAL_TABLET | Freq: Three times a day (TID) | ORAL | 1 refills | Status: DC

## 2022-02-26 MED ORDER — TRAZODONE HCL 50 MG PO TABS: 25.0000 mg | ORAL_TABLET | Freq: Every evening | ORAL | 5 refills | Status: DC | PRN

## 2022-02-26 NOTE — Telephone Encounter (Signed)
Refill sent for PIRFENIDONE to [facility]: [phone]  Dose: 801 mg three times daily  Last OV: 12/17/21 Provider: Dr. Erin Fulling  Next OV: not yet scheduled  CMET today with PCP stable  Knox Saliva, PharmD, MPH, BCPS Clinical Pharmacist (Rheumatology and Pulmonology)

## 2022-03-03 ENCOUNTER — Encounter: Payer: Self-pay | Admitting: Family Medicine

## 2022-03-18 ENCOUNTER — Other Ambulatory Visit: Payer: Self-pay | Admitting: Physician Assistant

## 2022-03-20 ENCOUNTER — Other Ambulatory Visit: Payer: Self-pay | Admitting: Family Medicine

## 2022-03-20 DIAGNOSIS — F5101 Primary insomnia: Secondary | ICD-10-CM

## 2022-03-21 ENCOUNTER — Encounter: Payer: Self-pay | Admitting: Family Medicine

## 2022-03-21 ENCOUNTER — Encounter: Payer: Self-pay | Admitting: Internal Medicine

## 2022-03-25 MED ORDER — BISOPROLOL FUMARATE 5 MG PO TABS: 2.5000 mg | ORAL_TABLET | Freq: Every day | ORAL | 2 refills | Status: DC

## 2022-03-26 NOTE — Telephone Encounter (Signed)
Rosann Auerbach, I would have him continue the bisoprolol and the spironolactone.  Lets try him on low-dose losartan at 25 mg and see what happens to his cough thanks

## 2022-03-27 MED ORDER — LOSARTAN POTASSIUM 25 MG PO TABS: 25.0000 mg | ORAL_TABLET | Freq: Every day | ORAL | 3 refills | Status: DC

## 2022-03-27 NOTE — Addendum Note (Signed)
Addended by: Thora Lance on: 03/27/2022 03:15 PM   Modules accepted: Orders

## 2022-04-08 ENCOUNTER — Other Ambulatory Visit (HOSPITAL_COMMUNITY): Payer: 59

## 2022-04-08 ENCOUNTER — Other Ambulatory Visit (HOSPITAL_BASED_OUTPATIENT_CLINIC_OR_DEPARTMENT_OTHER): Payer: Self-pay

## 2022-04-08 ENCOUNTER — Ambulatory Visit (HOSPITAL_BASED_OUTPATIENT_CLINIC_OR_DEPARTMENT_OTHER)
Admission: RE | Admit: 2022-04-08 | Discharge: 2022-04-08 | Disposition: A | Discharge status: Home / Self Care | Payer: 59 | Source: Ambulatory Visit | Attending: Pulmonary Disease | Admitting: Pulmonary Disease

## 2022-04-08 DIAGNOSIS — J84112 Idiopathic pulmonary fibrosis: Secondary | ICD-10-CM | POA: Diagnosis present

## 2022-04-08 MED ORDER — COMIRNATY 30 MCG/0.3ML IM SUSY
PREFILLED_SYRINGE | INTRAMUSCULAR | 0 refills | Status: AC
  Filled 2022-04-08: qty 0.3, 1d supply, fill #0

## 2022-04-15 ENCOUNTER — Ambulatory Visit (INDEPENDENT_AMBULATORY_CARE_PROVIDER_SITE_OTHER): Payer: 59

## 2022-04-15 DIAGNOSIS — I429 Cardiomyopathy, unspecified: Secondary | ICD-10-CM

## 2022-04-15 LAB — CUP PACEART REMOTE DEVICE CHECK
Battery Remaining Longevity: 71 mo
Battery Remaining Percentage: 83 %
Battery Voltage: 2.98 V
Brady Statistic AP VP Percent: 14 %
Brady Statistic AP VS Percent: 1 %
Brady Statistic AS VP Percent: 86 %
Brady Statistic AS VS Percent: 1 %
Brady Statistic RA Percent Paced: 14 %
Date Time Interrogation Session: 20231226020011
Implantable Lead Connection Status: 753985
Implantable Lead Connection Status: 753985
Implantable Lead Connection Status: 753985
Implantable Lead Implant Date: 20130115
Implantable Lead Implant Date: 20130115
Implantable Lead Implant Date: 20220926
Implantable Lead Location: 753858
Implantable Lead Location: 753859
Implantable Lead Location: 753860
Implantable Pulse Generator Implant Date: 20220926
Lead Channel Impedance Value: 380 Ohm
Lead Channel Impedance Value: 400 Ohm
Lead Channel Impedance Value: 580 Ohm
Lead Channel Pacing Threshold Amplitude: 0.625 V
Lead Channel Pacing Threshold Amplitude: 1.5 V
Lead Channel Pacing Threshold Amplitude: 1.625 V
Lead Channel Pacing Threshold Pulse Width: 0.3 ms
Lead Channel Pacing Threshold Pulse Width: 0.4 ms
Lead Channel Pacing Threshold Pulse Width: 1.5 ms
Lead Channel Sensing Intrinsic Amplitude: 12 mV
Lead Channel Sensing Intrinsic Amplitude: 3.4 mV
Lead Channel Setting Pacing Amplitude: 1.625
Lead Channel Setting Pacing Amplitude: 1.75 V
Lead Channel Setting Pacing Amplitude: 2.625
Lead Channel Setting Pacing Pulse Width: 0.4 ms
Lead Channel Setting Pacing Pulse Width: 1 ms
Lead Channel Setting Sensing Sensitivity: 4 mV
Pulse Gen Model: 3562
Pulse Gen Serial Number: 6509372

## 2022-04-17 ENCOUNTER — Encounter: Payer: Self-pay | Admitting: Internal Medicine

## 2022-04-17 ENCOUNTER — Ambulatory Visit: Payer: 59 | Attending: Internal Medicine | Admitting: Internal Medicine

## 2022-04-17 VITALS — Ht 69.0 in

## 2022-04-17 DIAGNOSIS — Z950 Presence of cardiac pacemaker: Secondary | ICD-10-CM

## 2022-04-17 DIAGNOSIS — I4719 Other supraventricular tachycardia: Secondary | ICD-10-CM

## 2022-04-17 DIAGNOSIS — I453 Trifascicular block: Secondary | ICD-10-CM | POA: Diagnosis not present

## 2022-04-17 NOTE — Progress Notes (Signed)
Electrophysiology TeleHealth Note   Due to national recommendations of social distancing due to COVID 19, an audio/video telehealth visit is felt to be most appropriate for this patient at this time.  See MyChart message from today for the patient's consent to telehealth for [facility].   Date:  04/17/2022   ID:  William Curry, DOB 07/07/1950, MRN [id number]  Location: patient's home  Provider location: [address], [place]  Evaluation Performed: Initial Evaluation  PCP:  Darreld Mclean, MD  Cardiologist:  None   Electrophysiologist:  Virl Axe, MD   Chief Complaint:  CHF  History of Present Illness:    William Curry is a 71 y.o. male who presents via audio/video conferencing for a telehealth visit today for  Follow-up of CRT-D upgrade 9/22 following pacemaker implantation 2013 for complete heart block       The patient denies chest pain, shortness of breath, nocturnal dyspnea, orthopnea or peripheral edema.  There have been no palpitations, lightheadedness or syncope.    Cough improved off entresto>> tried losartan    Some diaphragmatic stimulation    Date   Cr         K Hgb    11/18 1.06 4.5      9/20 1.01 4.3 16.1    11/21 0.94 4.4  15.2    9/22      15.3    10/22 0.93 4.7      11/23 0.82 4.5 14.5     DATE TEST EF%    01/13 Echo  60-65 %    7/22 Echo  30-35%    8/22 LHC   No obst CAD  12/22 Echo 55%    10/23 Echo  50-55%          The patient denies symptoms of fevers, chills, cough, or new SOB worrisome for COVID 19.    Past Medical History:  Diagnosis Date   DDD (degenerative disc disease), lumbar    GERD (gastroesophageal reflux disease)    Heart block AV complete ([place]) 2013   Pacemaker   History of chicken pox    Hypertension 2002-2005   Kidney stone 1994-2010   Passed stone in 1994, Lithotripsy in 2010, stones passed in 2018   Macular degeneration    Scoliosis    Skin cancer    Squamous cell carcomina. Right  Knee   Tinea versicolor     Past Surgical History:  Procedure Laterality Date   BIV UPGRADE N/A 01/14/2021   Procedure: BIV PPM UPGRADE;  Surgeon: Deboraha Sprang, MD;  Location: [facility];  Service: Cardiovascular;  Laterality: N/A;   CARTILAGE REPAIR  Nov.-1989   Left knee-HARD TO WAKE UP AFTER SX   COLONOSCOPY     KNEE ARTHROSCOPY WITH ANTERIOR CRUCIATE LIGAMENT (ACL) REPAIR  [phone]   Left knee-HARD TO WAKE UP AFTER SX   LEFT HEART CATH AND CORONARY ANGIOGRAPHY N/A 12/18/2020   Procedure: LEFT HEART CATH AND CORONARY ANGIOGRAPHY;  Surgeon: Belva Crome, MD;  Location: [facility];  Service: Cardiovascular;  Laterality: N/A;   LITHOTRIPSY  Jun-2010   Kidney stone (1st stone 1994 - Passed)   PACEMAKER INSTALLATION  Jan-2013   Complete Heart Block   SKIN BIOPSY     RIght knee for skin cancer    Current Outpatient Medications  Medication Sig Dispense Refill   albuterol (ACCUNEB) 1.25 MG/3ML nebulizer solution Take 3 mLs (1.25 mg total) by nebulization every  6 (six) hours as needed for wheezing. 180 mL 11   bisoprolol (ZEBETA) 5 MG tablet Take 0.5 tablets (2.5 mg total) by mouth daily. 15 tablet 2   COVID-19 mRNA vaccine 2023-2024 (COMIRNATY) syringe Inject into the muscle. 0.3 mL 0   ipratropium (ATROVENT) 0.03 % nasal spray Place 2 sprays into both nostrils every 12 (twelve) hours. 30 mL 12   losartan (COZAAR) 25 MG tablet Take 1 tablet (25 mg total) by mouth daily. 90 tablet 3   Multiple Vitamin (MULTIVITAMIN) tablet Take 1 tablet by mouth daily. [facility]/ one a day men complete 50 +     Multiple Vitamins-Minerals (PRESERVISION AREDS 2+MULTI VIT PO) Take 1 tablet 2 (two) times daily by mouth.     omeprazole (PRILOSEC OTC) 20 MG tablet Take 20 mg by mouth daily.     OVER THE COUNTER MEDICATION Take 65 mg by mouth daily. CBD 1 dropper full     Pirfenidone (ESBRIET) 801 MG TABS Take 1 tablet (801 mg) by mouth 3 (three) times daily with meals. 270 tablet 1    sildenafil (REVATIO) 20 MG tablet TAKE 3 TO 5 TABLETS BY MOUTH ONCE DAILY AS NEEDED 50 tablet 0   spironolactone (ALDACTONE) 25 MG tablet TAKE 1/2 TABLET BY MOUTH EVERY DAY 45 tablet 2   terbinafine (LAMISIL) 1 % cream Apply 1 application topically daily as needed (Jock itch).     triamcinolone cream (KENALOG) 0.1 % Apply topically.     valACYclovir (VALTREX) 500 MG tablet Take 500 mg by mouth as needed (Cold sores).     traZODone (DESYREL) 50 MG tablet TAKE 0.5-1 TABLETS BY MOUTH AT BEDTIME AS NEEDED FOR SLEEP. 90 tablet 2   No current facility-administered medications for this visit.    Allergies:   Penicillins      ROS:  Please see the history of present illness.   All other systems are personally reviewed and negative.    Exam:    Vital Signs:  Ht '5\' 9"'$  (1.753 m)   BMI 22.15 kg/m     Well appearing, alert and conversant, regular work of breathing,  good skin color Eyes- anicteric, neuro- grossly intact, skin- no apparent rash or lesions or cyanosis, mouth- oral mucosa is pink   Labs/Other Tests and Data Reviewed:    Recent Labs: 02/26/2022: ALT 14; BUN 18; Creatinine, Ser 0.82; Hemoglobin 14.5; Platelets 195.0; Potassium 4.5; Sodium 139   Wt Readings from Last 3 Encounters:  01/16/22 150 lb (68 kg)  12/17/21 150 lb 9.6 oz (68.3 kg)  11/12/21 150 lb 12.8 oz (68.4 kg)     Other studies personally reviewed: Additional studies/ records that were reviewed today include (As above)   The patient presents wearable device technology report for my review today. 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  Cough also improved we will continue him on losartan.  Diaphragmatic stimulation, we will plan to  bring him in for reprogramming offer him while I was out early he preferred to wait until I return    COVID 19 screen The patient denies symptoms of COVID 19 at this time.  The importance of social distancing was discussed today.  Follow-up: 4 weeks Next remote: As  scheduled  Current medicines are reviewed at length with the patient today.   The patient no concerns regarding his medicines.  The following changes were made today:  none  Labs/ tests ordered today include   No orders of the defined types were placed in this encounter.   Future tests ( post COVID )     Patient Risk:  after full review of this patients clinical status, I feel that they are at moderate risk at this time.  Today, I have spent 14 minutes with the patient with telehealth technology discussing  (As above)  .    Signed, Virl Axe, MD  04/17/2022 4:47 PM     [facility] [place] [address] [phone] (office) [phone] (fax)

## 2022-04-17 NOTE — Patient Instructions (Addendum)
Medication Instructions:  Your physician recommends that you continue on your current medications as directed. Please refer to the Current Medication list given to you today.  *If you need a refill on your cardiac medications before your next appointment, please call your pharmacy*  Lab Work: None ordered.  If you have labs (blood work) drawn today and your tests are completely normal, you will receive your results only by: [place] (if you have MyChart) OR A paper copy in the mail If you have any lab test that is abnormal or we need to change your treatment, we will call you to review the results.  Testing/Procedures: None ordered.  Follow-Up: At [facility], you and your health needs are our priority.  As part of our continuing mission to provide you with exceptional heart care, we have created designated Provider Care Teams.  These Care Teams include your primary Cardiologist (physician) and Advanced Practice Providers (APPs -  Physician Assistants and Nurse Practitioners) who all work together to provide you with the care you need, when you need it.  We recommend signing up for the patient portal called "MyChart".  Sign up information is provided on this After Visit Summary.  MyChart is used to connect with patients for Virtual Visits (Telemedicine).  Patients are able to view lab/test results, encounter notes, upcoming appointments, etc.  Non-urgent messages can be sent to your provider as well.   To learn more about what you can do with MyChart, go to [email or web address].    Your next appointment:   Please schedule an appointment for first week of January for pacemaker reprogramming per Dr. Virl Axe.    The format for your next appointment:   In Person  Provider:   Dr. Virl Axe   Remote monitoring is used to monitor your Pacemaker from home. This monitoring reduces the number of office visits required to check your device to one time per year. It allows [place]  to keep an eye on the functioning of your device to ensure it is working properly. You are scheduled for a device check from home on 07/15/22. You may send your transmission at any time that day. If you have a wireless device, the transmission will be sent automatically. After your physician reviews your transmission, you will receive a postcard with your next transmission date.  Important Information About Sugar

## 2022-05-09 NOTE — Progress Notes (Signed)
Remote pacemaker transmission.   

## 2022-05-15 ENCOUNTER — Ambulatory Visit: Payer: 59 | Attending: Internal Medicine | Admitting: Internal Medicine

## 2022-05-15 ENCOUNTER — Encounter: Payer: Self-pay | Admitting: Internal Medicine

## 2022-05-15 VITALS — BP 110/62 | HR 78 | Ht 69.0 in | Wt 146.2 lb

## 2022-05-15 DIAGNOSIS — I453 Trifascicular block: Secondary | ICD-10-CM | POA: Diagnosis not present

## 2022-05-15 DIAGNOSIS — I429 Cardiomyopathy, unspecified: Secondary | ICD-10-CM

## 2022-05-15 DIAGNOSIS — Z950 Presence of cardiac pacemaker: Secondary | ICD-10-CM | POA: Diagnosis not present

## 2022-05-15 NOTE — Patient Instructions (Signed)
Medication Instructions:  Your physician recommends that you continue on your current medications as directed. Please refer to the Current Medication list given to you today.  *If you need a refill on your cardiac medications before your next appointment, please call your pharmacy*   Lab Work: None ordered.  If you have labs (blood work) drawn today and your tests are completely normal, you will receive your results only by: MyChart Message (if you have MyChart) OR A paper copy in the mail If you have any lab test that is abnormal or we need to change your treatment, we will call you to review the results.   Testing/Procedures: None ordered.    Follow-Up: At [facility], you and your health needs are our priority.  As part of our continuing mission to provide you with exceptional heart care, we have created designated Provider Care Teams.  These Care Teams include your primary Cardiologist (physician) and Advanced Practice Providers (APPs -  Physician Assistants and Nurse Practitioners) who all work together to provide you with the care you need, when you need it.  We recommend signing up for the patient portal called "MyChart".  Sign up information is provided on this After Visit Summary.  MyChart is used to connect with patients for Virtual Visits (Telemedicine).  Patients are able to view lab/test results, encounter notes, upcoming appointments, etc.  Non-urgent messages can be sent to your provider as well.   To learn more about what you can do with MyChart, go to [email or web address].    Your next appointment:   12 months with Dr Klein 

## 2022-05-15 NOTE — Progress Notes (Signed)
Patient ID: William Curry, male   DOB: 12/04/1950, 72 y.o.   MRN: [id number]      Patient Care Team: Darreld Mclean, MD as PCP - General (Family Medicine) Deboraha Sprang, MD as PCP - Electrophysiology (Cardiology)   HPI  William Curry is a 72 y.o. 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       He states that he has been fine since he received his pace maker and has more energy. His adds that his breathing is about the same.  He suspended the Entresto, which reduced his cough by 25%. He started pirfenidone about the same time so as not sure which was responsible for the reduction in cough   Continue to complain of diaphragmatic stimulation    DATE PR QRS Pacing %  2/16     16  4/17 228 138 15  4/18 218 142 10  4/19 250 140 23  8/22 AV220 182 100  12/22 AV174 132 CRT       Date Cr K Hgb   11/18 1.06 4.5    9/20 1.01 4.3         16.1   11/21 0.94 4.4          15.2   9/22              15.3   10/22} 0.93 4.7     DATE TEST EF%   01/13 Echo  60-65 %   7/22 Echo  30-35%   8/22 LHC  No obst CAD  12/22 Echo 55%    10/23 Echo  50-55%      Past Medical History:  Diagnosis Date   DDD (degenerative disc disease), lumbar    GERD (gastroesophageal reflux disease)    Heart block AV complete ([place]) 2013   Pacemaker   History of chicken pox    Hypertension 2002-2005   Kidney stone 1994-2010   Passed stone in 1994, Lithotripsy in 2010, stones passed in 2018   Macular degeneration    Scoliosis    Skin cancer    Squamous cell carcomina. Right Knee   Tinea versicolor     Past Surgical History:  Procedure Laterality Date   BIV UPGRADE N/A 01/14/2021   Procedure: BIV PPM UPGRADE;  Surgeon: Deboraha Sprang, MD;  Location: [place] [facility];  Service: Cardiovascular;  Laterality: N/A;   CARTILAGE REPAIR  Nov.-1989   Left knee-HARD TO WAKE UP AFTER SX   COLONOSCOPY     KNEE ARTHROSCOPY WITH  ANTERIOR CRUCIATE LIGAMENT (ACL) REPAIR  [phone]   Left knee-HARD TO WAKE UP AFTER SX   LEFT HEART CATH AND CORONARY ANGIOGRAPHY N/A 12/18/2020   Procedure: LEFT HEART CATH AND CORONARY ANGIOGRAPHY;  Surgeon: Belva Crome, MD;  Location: [place] [facility];  Service: Cardiovascular;  Laterality: N/A;   LITHOTRIPSY  Jun-2010   Kidney stone (1st stone 1994 - Passed)   PACEMAKER INSTALLATION  Jan-2013   Complete Heart Block   SKIN BIOPSY     RIght knee for skin cancer    Current Meds  Medication Sig   albuterol (ACCUNEB) 1.25 MG/3ML nebulizer solution Take 3 mLs (1.25 mg total) by nebulization every 6 (six) hours as needed for wheezing.   bisoprolol (ZEBETA) 5 MG tablet Take 0.5 tablets (2.5 mg total) by mouth daily.   COVID-19 mRNA vaccine 2023-2024 (COMIRNATY) syringe Inject into the muscle.   ipratropium (ATROVENT)  0.03 % nasal spray Place 2 sprays into both nostrils every 12 (twelve) hours.   losartan (COZAAR) 25 MG tablet Take 1 tablet (25 mg total) by mouth daily.   Multiple Vitamin (MULTIVITAMIN) tablet Take 1 tablet by mouth daily. [facility]/ one a day men complete 50 +   Multiple Vitamins-Minerals (PRESERVISION AREDS 2+MULTI VIT PO) Take 1 tablet 2 (two) times daily by mouth.   omeprazole (PRILOSEC OTC) 20 MG tablet Take 20 mg by mouth daily.   OVER THE COUNTER MEDICATION Take 65 mg by mouth daily. CBD 1 dropper full   Pirfenidone (ESBRIET) 801 MG TABS Take 1 tablet (801 mg) by mouth 3 (three) times daily with meals.   sildenafil (REVATIO) 20 MG tablet TAKE 3 TO 5 TABLETS BY MOUTH ONCE DAILY AS NEEDED   spironolactone (ALDACTONE) 25 MG tablet TAKE 1/2 TABLET BY MOUTH EVERY DAY   terbinafine (LAMISIL) 1 % cream Apply 1 application topically daily as needed (Jock itch).   triamcinolone cream (KENALOG) 0.1 % Apply topically.   valACYclovir (VALTREX) 500 MG tablet Take 500 mg by mouth as needed (Cold sores).    Allergies  Allergen Reactions   Penicillins Other (See Comments)     As a child    Review of Systems negative except from HPI and PMH Please see the history of present illness. (+) Cough All other systems are reviewed and negative.     Physical Exam: BP 110/62   Pulse 78   Ht '5\' 9"'$  (1.753 m)   Wt 146 lb 3.2 oz (66.3 kg)   SpO2 95%   BMI 21.59 kg/m  Well developed and well nourished in no acute distress HENT normal Neck supple with JVP-flat Clear Device pocket well healed; without hematoma or erythema.  There is no tethering  Regular rate and rhythm, no urmur Abd-soft with active BS No Clubbing cyanosis edema Skin-warm and dry A & Oriented  Grossly normal sensory and motor function  ECG sinus with P synchronous pacing with a negative QRS lead I and a rS in lead V1 QRSd 136 ms  Device function is normal. Programming changes (See Below)   See Paceart for details     04/18/2021 ECG: Sinus with P synchronous pacing at a rate of 72 Interval 17/13/45 interval QRS negative lead I and RS in lead V1.  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  Extensive workup with [facility] identified the threshold in the 1-can configuration at 1 V at 1.0 ms.  He was programmed at 1 V at 1.5 ms.

## 2022-05-22 ENCOUNTER — Encounter: Payer: Self-pay | Admitting: Internal Medicine

## 2022-06-03 ENCOUNTER — Encounter: Payer: Self-pay | Admitting: Pulmonary Disease

## 2022-06-03 ENCOUNTER — Ambulatory Visit: Payer: 59 | Admitting: Pulmonary Disease

## 2022-06-03 VITALS — BP 116/62 | HR 67 | Ht 69.0 in | Wt 145.2 lb

## 2022-06-03 DIAGNOSIS — J84112 Idiopathic pulmonary fibrosis: Secondary | ICD-10-CM | POA: Diagnosis not present

## 2022-06-03 DIAGNOSIS — R053 Chronic cough: Secondary | ICD-10-CM | POA: Diagnosis not present

## 2022-06-03 NOTE — Progress Notes (Signed)
Synopsis: Referred in 04/2020 for chronic cough by Dr. Lamar Blinks, MD  Subjective:   PATIENT ID: William Curry GENDER: male DOB: [birth year], MRN: [id number]  HPI  Chief Complaint  Patient presents with   Follow-up    6 mo f/u for IPF. States he has been coughing more recently. Productive cough with clear phlegm. Tends to have SOB after the coughing fits.    William Curry is a 72 year old male, never smoker with with history of GERD who returns to pulmonary clinic for follow up of pulmonary fibrosis.   He is using listerine spray for cough suppression through out the day. He reports his cough symptoms have worsened. He has dry coughing episodes and also productive cough. He is using duonebs twice daily follow by flutter valve. He reports the mornings go well after he does his pulmonary hygiene routine. He will have increase cough symptoms after eating milk, yogurt or ice cream.   Hycodan cough syrup can decrease his cough symptoms by about 60%.  He has lost 5lbs since last visit.    OV 12/17/21 He has been working with our clinical trial team and was recently nvolved in ORV-PF-01 / IPF COMFORT trial investigating cough in IPF patients. He did not notice any improvement in his cough symptoms.   He feels the cough is more productive at this time. He is spitting up more phlegm which is clear to milky color. The cough will wake him up once in a while at night. He is using ipratropium nasal spray. He is using listerine mouth spray which helps with the cough. He did not find relief from sucking on sugar free candies. He is experiencing popping/cracking of his jaw with wide opening of his mouth.   He has lost 10lbs since March.   OV 06/20/21 He continues to do well on esbriet. He reports a 7lbs weight loss since January due to lack of appetite. He reports his cough is worse since restarting the entresto.  He tried flonase for sinus drainage with no improvement.  They are not planning to  move to [place] for another year now.  PFTs today remain stable. Moving in May.   OV 03/21/21 He started with Esbriet therapy on 02/07/2021 and has not tolerated increasing to 3 tabs 3 times per day.  He does report dizziness 30 minutes after taking Esbriet.  He has not checked his blood pressure during these times.  He has been holding losartan or Entresto.  He also reports developing some itchy areas of skin since starting esbriet. He is using CeraVe cream which helps.   He reports his cough is about 25% improved since last visit as we discussed holding Entresto for possible contribution to his cough symptoms.  He has been holding his fluticasone nasal spray due to issues with epistaxis.  He and his wife are planning to move back to [place], [place] in May 2023.  OV 01/29/21 He reports his cough has progressed since last visit. Remains dry with occasional sputum production. He was started on entresto in August. He was trying menthol cough candies without relief.  He had repeat PFTs today which show a decline in DLCO to 18.49 from 22 and 26 previously.   OV 10/18/20 Repeat PFTs on 09/10/20 showed stability in his lung function with mild restrictive defect. DLCO remains normal.   He has been approved for esbriet and has meet with our pharmacy team about the benefits and risks of the medication as outlined in  there meeting on 09/10/20.  He wishes to currently hold off on this therapy at this time until later this fall as he wants to avoid any of the side effects this summer.  He has had an increase in cough symptoms with sputum production since last visit.  He attributes this to his recent travels to the [facility] and changes in the climate from cold to hot weather.  He denies any shortness of breath.  His wife has accompanied him on today's visit.  He brought a copy of his mother's death certificate which had idiopathic pulmonary fibrosis listed as the cause of death.  He  reports she was a very heavy smoker.  OV 08/27/2020: His HRCT Chest on 05/14/20 showed subpleural reticulation, ground-glass, traction bronchiectasis/bronchiolectasis and probable honeycombing with upper/midlung involvement.   His PFTs 06/21/20 show moderate restrictive defect with normal DLCO.   Inflammatory markers and myositis panel are negative.  We reviewed his clinical history, HRCT chest and PFTs at multidisciplinary ILD conference in April 2022 with a recommendation to initiate anti-fibrotic therapy as his findings are concerning for pulmonary fibrosis.    He continues to experience cough with occasional clear/milky colored sputum. The nasal sprays have helped somehwat, he continues to use flonase. He has decreased his dose of pantoprazoled from 58m daily to 271mdaily with no increase in his reflux symptoms.   OV 05/01/20: He reports having a cough since 2017 that has progressively worsened over the last 1 year. He does produce clear mucous at times. Has never cough up blood. He has been evaluated by GI and ENT for this cough in the past. He was started on pantoprazole over 1 year ago and he has not noticed any improvement in his cough, which he reports has worsened over the last year. He had laryngoscopy performed by ENT 10/10/19 which was unremarkable.   He denies any shortness of breath or wheezing with the cough. He denies overt heart burn or reflux symptoms. He denies seasonal allergies, sinus congestion or drainage.   He is a never smoker. 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    He has a nephew with cystic fibrosis.  Past Medical History:  Diagnosis Date   DDD (degenerative disc disease), lumbar    GERD (gastroesophageal reflux disease)    [id number]    Pacemaker   History of chicken pox    Hypertension 2002-2005   Kidney stone 1994-2010   Passed stone in 1994, Lithotripsy in 2010, stones passed in 2018   Macular degeneration    Scoliosis    Skin cancer    Squamous cell carcomina. Right Knee   Tinea versicolor      Family History  Problem Relation Age of Onset   Cancer Mother 8091     Breast   Diabetes Mother 7578     on insulin   Breast cancer Mother    Diabetes Father 7566     on insulin   Bladder Cancer Father    Colon polyps Sister    Colon cancer Maternal Grandfather    Esophageal cancer Neg Hx    Rectal cancer Neg Hx    Stomach cancer Neg Hx    Prostate cancer Neg Hx  Social History   Socioeconomic History   Marital status: Married    Spouse name: Not on file   Number of children: Not on file   Years of education: Not on file   Highest education level: Not on file  Occupational History   Occupation: Insurance underwriter / Accountant  Tobacco Use   Smoking status: Never   Smokeless tobacco: Former    Types: Chew   Tobacco comments:    admits do doing chewing tabacco. 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   To [place] 2013   Remarried 2007   [employer] '71-'75   Social Determinants of Health   Financial Resource Strain: Not on file  Food Insecurity: Not on file  Transportation Needs: Not on file  Physical Activity: Not on file  Stress: Not on file  Social Connections: Not on file  Intimate Partner Violence: Not on file     Allergies  Allergen Reactions   Penicillins Other (See Comments)    As a child     Outpatient Medications Prior to Visit  Medication Sig Dispense Refill   albuterol (ACCUNEB) 1.25 MG/3ML  nebulizer solution Take 3 mLs (1.25 mg total) by nebulization every 6 (six) hours as needed for wheezing. 180 mL 11   bisoprolol (ZEBETA) 5 MG tablet Take 0.5 tablets (2.5 mg total) by mouth daily. 15 tablet 2   COVID-19 mRNA vaccine 2023-2024 (COMIRNATY) syringe Inject into the muscle. 0.3 mL 0   ipratropium (ATROVENT) 0.03 % nasal spray Place 2 sprays into both nostrils every 12 (twelve) hours. 30 mL 12   losartan (COZAAR) 25 MG tablet Take 1 tablet (25 mg total) by mouth daily. 90 tablet 3   Multiple Vitamin (MULTIVITAMIN) tablet Take 1 tablet by mouth daily. [facility]/ one a day men complete 50 +     Multiple Vitamins-Minerals (PRESERVISION AREDS 2+MULTI VIT PO) Take 1 tablet 2 (two) times daily by mouth.     omeprazole (PRILOSEC OTC) 20 MG tablet Take 20 mg by mouth daily.     OVER THE COUNTER MEDICATION Take 65 mg by mouth daily. CBD 1 dropper full     Pirfenidone (ESBRIET) 801 MG TABS Take 1 tablet (801 mg) by mouth 3 (three) times daily with meals. 270 tablet 1   sildenafil (REVATIO) 20 MG tablet TAKE 3 TO 5 TABLETS BY MOUTH ONCE DAILY AS NEEDED 50 tablet 0   spironolactone (ALDACTONE) 25 MG tablet TAKE 1/2 TABLET BY MOUTH EVERY DAY 45 tablet 2   terbinafine (LAMISIL) 1 % cream Apply 1 application topically daily as needed (Jock itch).     triamcinolone cream (KENALOG) 0.1 % Apply topically.     valACYclovir (VALTREX) 500 MG tablet Take 500 mg by mouth as needed (Cold sores).     traZODone (DESYREL) 50 MG tablet TAKE 0.5-1 TABLETS BY MOUTH AT BEDTIME AS NEEDED FOR SLEEP. 90 tablet 2   No facility-administered medications prior to visit.   Review of Systems  Constitutional:  Negative for chills, fever, malaise/fatigue and weight loss.  HENT:  Negative for congestion, sinus pain and sore throat.  Eyes: Negative.   Respiratory:  Positive for cough and sputum production. Negative for hemoptysis, shortness of breath and wheezing.   Cardiovascular:  Negative for chest pain, palpitations,  orthopnea, claudication and leg swelling.  Gastrointestinal:  Negative for abdominal pain, heartburn, nausea and vomiting.  Genitourinary: Negative.   Musculoskeletal:  Negative for joint pain and myalgias.  Skin:  Negative for rash.  Neurological:  Negative for weakness.  Endo/Heme/Allergies: Negative.   Psychiatric/Behavioral: Negative.  The patient is not nervous/anxious.    Objective:   Vitals:   06/03/22 0915  BP: 116/62  Pulse: 67  SpO2: 99%  Weight: 145 lb 3.2 oz (65.9 kg)  Height: 5' 9"$  (1.753 m)   Physical Exam Constitutional:      General: He is not in acute distress. HENT:     Head: Normocephalic and atraumatic.  Eyes:     Conjunctiva/sclera: Conjunctivae normal.  Cardiovascular:     Rate and Rhythm: Normal rate and regular rhythm.     Pulses: Normal pulses.     Heart sounds: Normal heart sounds. No murmur heard. Pulmonary:     Effort: Pulmonary effort is normal.     Breath sounds: Rales (bibasilar) present. No wheezing or rhonchi.  Musculoskeletal:     Right lower leg: No edema.     Left lower leg: No edema.  Skin:    General: Skin is warm and dry.  Neurological:     General: No focal deficit present.     Mental Status: He is alert.    CBC    Component Value Date/Time   WBC 5.9 02/26/2022 0905   RBC 4.45 02/26/2022 0905   HGB 14.5 02/26/2022 0905   HGB 15.3 01/10/2021 1035   HCT 43.0 02/26/2022 0905   HCT 44.5 01/10/2021 1035   PLT 195.0 02/26/2022 0905   PLT 193 01/10/2021 1035   MCV 96.8 02/26/2022 0905   MCV 94 01/10/2021 1035   MCH 32.1 01/10/2021 1035   MCHC 33.8 02/26/2022 0905   RDW 12.7 02/26/2022 0905   RDW 13.1 01/10/2021 1035      Latest Ref Rng & Units 02/26/2022    9:05 AM 01/16/2022    3:01 PM 01/24/2021   11:43 AM  BMP  Glucose 70 - 99 mg/dL 88  92  91   BUN 6 - 23 mg/dL 18  22  16   $ Creatinine 0.40 - 1.50 mg/dL 0.82  0.91  0.93   BUN/Creat Ratio 10 - 24  24  17   $ Sodium 135 - 145 mEq/L 139  139  139   Potassium 3.5 - 5.1  mEq/L 4.5  4.3  4.7   Chloride 96 - 112 mEq/L 102  98  100   CO2 19 - 32 mEq/L 34  26  26   Calcium 8.4 - 10.5 mg/dL 9.1  9.5  9.3    Chest imaging: HRCT Chest 04/08/22 1. Subpleural predominant reticular opacities with traction bronchiectasis and no clear cranial caudal predominance. No evidence of progression when compared with March 27, 2021 prior, although findings have likely progressed when compared with May 14, 2020 prior. Findings are categorized as probable UIP per consensus guidelines: Diagnosis of Idiopathic Pulmonary Fibrosis: An Official ATS/ERS/JRS/ALAT Clinical Practice Guideline. [place], Iss 5, [phone], Dec 20 2016. 2. Aortic Atherosclerosis  HRCT Chest 03/27/21 1. No significant change in moderate pulmonary fibrosis in a pattern without clear apical to basal gradient featuring irregular peripheral interstitial opacity, septal thickening,  and areas of subpleural bronchiolectasis throughout. Mild varicoid and traction bronchiectasis. Findings are categorized as probable UIP per consensus guidelines: Diagnosis of Idiopathic Pulmonary Fibrosis: An Official ATS/ERS/JRS/ALAT Clinical Practice Guideline. [place], Iss 5, [phone], Dec 20 2016. 2. Unchanged prominent mediastinal and bilateral hilar lymph nodes, some of which are calcified, nonspecific although likely reactive to fibrosis. Calcification may be sequelae of prior granulomatous infection. 3. Cardiomegaly. 4. Enlargement of the main pulmonary artery, as can be seen in pulmonary hypertension.  HRCT Chest 05/14/20 1. Pulmonary parenchymal pattern of fibrosis may be due to nonspecific interstitial pneumonitis. Usual interstitial pneumonitis is not excluded. Findings are indeterminate for UIP per consensus guidelines: Diagnosis of Idiopathic Pulmonary Fibrosis: An Official ATS/ERS/JRS/ALAT Clinical Practice Guideline. [place],  Iss 5, [phone], Dec 20 2016. 2. Small to borderline enlarged mediastinal lymph nodes can be seen in the setting of interstitial lung disease. 3. Right renal stone.  Chest Radiograph 05/04/2018 Cardiac shadow is enlarged. Pacing device is noted. The lungs are well aerated bilaterally with some minimal interstitial changes. No focal confluent infiltrate or effusion is seen. No bony abnormality is noted.  PFT:    Latest Ref Rng & Units 06/20/2021    8:45 AM 01/29/2021   12:49 PM 09/10/2020   11:50 AM 06/21/2020   11:55 AM  PFT Results  FVC-Pre L 3.09  2.94  3.01  2.96   FVC-Predicted Pre % 72  69  70  68   FVC-Post L 3.14  2.99  2.99  2.87   FVC-Predicted Post % 73  70  69  66   Pre FEV1/FVC % % 93  94  94  93   Post FEV1/FCV % % 94  96  94  95   FEV1-Pre L 2.89  2.78  2.84  2.75   FEV1-Predicted Pre % 92  88  89  86   FEV1-Post L 2.95  2.86  2.81  2.74   DLCO uncorrected ml/min/mmHg 19.67  18.85  26.58  22.53   DLCO UNC% % 78  74  105  89   DLCO corrected ml/min/mmHg 19.67  18.49  26.58  22.53   DLCO COR %Predicted % 78  73  105  89   DLVA Predicted % 105  106  136  144   TLC L 4.60  4.45  4.64  4.02   TLC % Predicted % 67  65  68  58   RV % Predicted % 63  65  67  50   PFTs 10/112022: mild restrictive defect and mild diffusion defect     Assessment & Plan:   IPF (idiopathic pulmonary fibrosis) (HCC)  Chronic cough  Discussion: Arinze Wolter is a 72 year old male, never smoker with with history of GERD who returns to pulmonary clinic for follow up of pulmonary fibrosis.   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  He has mild restrictive defect and normalized DLCO on PFTs. He is to continue esbriet 816m 3 times daily.  We will need to repeat PFTs within the next month for monitoring.   HRCT Chest is stable over the past 1 year but does show slight progression over the past 2 years.   He is to continue duoneb treatments and flutter valve every 4  hours as needed for mucous clearance. He was previously using twice daily and have encouraged him to try doing a treatment mid day and  monitor for improvement in his cough symptoms.   If this does not reduce his cough burden, we will consider a trial on gabapentin.   Follow up in 3 months.  William Jackson, MD [facility] Office: [phone]   Current Outpatient Medications:    albuterol (ACCUNEB) 1.25 MG/3ML nebulizer solution, Take 3 mLs (1.25 mg total) by nebulization every 6 (six) hours as needed for wheezing., Disp: 180 mL, Rfl: 11   bisoprolol (ZEBETA) 5 MG tablet, Take 0.5 tablets (2.5 mg total) by mouth daily., Disp: 15 tablet, Rfl: 2   COVID-19 mRNA vaccine 2023-2024 (COMIRNATY) syringe, Inject into the muscle., Disp: 0.3 mL, Rfl: 0   ipratropium (ATROVENT) 0.03 % nasal spray, Place 2 sprays into both nostrils every 12 (twelve) hours., Disp: 30 mL, Rfl: 12   losartan (COZAAR) 25 MG tablet, Take 1 tablet (25 mg total) by mouth daily., Disp: 90 tablet, Rfl: 3   Multiple Vitamin (MULTIVITAMIN) tablet, Take 1 tablet by mouth daily. [facility]/ one a day men complete 50 +, Disp: , Rfl:    Multiple Vitamins-Minerals (PRESERVISION AREDS 2+MULTI VIT PO), Take 1 tablet 2 (two) times daily by mouth., Disp: , Rfl:    omeprazole (PRILOSEC OTC) 20 MG tablet, Take 20 mg by mouth daily., Disp: , Rfl:    OVER THE COUNTER MEDICATION, Take 65 mg by mouth daily. CBD 1 dropper full, Disp: , Rfl:    Pirfenidone (ESBRIET) 801 MG TABS, Take 1 tablet (801 mg) by mouth 3 (three) times daily with meals., Disp: 270 tablet, Rfl: 1   sildenafil (REVATIO) 20 MG tablet, TAKE 3 TO 5 TABLETS BY MOUTH ONCE DAILY AS NEEDED, Disp: 50 tablet, Rfl: 0   spironolactone (ALDACTONE) 25 MG tablet, TAKE 1/2 TABLET BY MOUTH EVERY DAY, Disp: 45 tablet, Rfl: 2   terbinafine (LAMISIL) 1 % cream, Apply 1 application topically daily as needed (Jock itch)., Disp: , Rfl:    triamcinolone cream (KENALOG) 0.1 %,  Apply topically., Disp: , Rfl:    valACYclovir (VALTREX) 500 MG tablet, Take 500 mg by mouth as needed (Cold sores)., Disp: , Rfl:

## 2022-06-03 NOTE — Patient Instructions (Addendum)
Increase the frequency of nebulizer treatments and flutter valve to 3 times per day.  Let us know how your cough is doing in 2-4 weeks, if you continue to have significant cough symptoms then message [place] to start trial of gabapentin therapy.  Your CT scan is stable over the past year, but does show mild progression over the past 2 years.   We will schedule you for pulmonary function tests as early as possible  Recommend contacting our medical records for your records and copy of your CT scans to be put on a CD.  Follow up at the end of April

## 2022-06-18 ENCOUNTER — Ambulatory Visit (INDEPENDENT_AMBULATORY_CARE_PROVIDER_SITE_OTHER): Payer: 59 | Admitting: Pulmonary Disease

## 2022-06-18 DIAGNOSIS — J84112 Idiopathic pulmonary fibrosis: Secondary | ICD-10-CM | POA: Diagnosis not present

## 2022-06-18 NOTE — Progress Notes (Signed)
Full PFT performed today. °

## 2022-06-18 NOTE — Patient Instructions (Signed)
Full PFT performed today. °

## 2022-06-19 ENCOUNTER — Other Ambulatory Visit: Payer: Self-pay | Admitting: Internal Medicine

## 2022-06-21 ENCOUNTER — Other Ambulatory Visit: Payer: Self-pay | Admitting: Pulmonary Disease

## 2022-06-21 DIAGNOSIS — R053 Chronic cough: Secondary | ICD-10-CM

## 2022-06-22 LAB — PULMONARY FUNCTION TEST
DL/VA % pred: 118 %
DL/VA: 4.81 ml/min/mmHg/L
DLCO cor % pred: 88 %
DLCO cor: 22.12 ml/min/mmHg
DLCO unc % pred: 88 %
DLCO unc: 22.12 ml/min/mmHg
FEF 25-75 Post: 5.6 L/sec
FEF 25-75 Pre: 5.15 L/sec
FEF2575-%Change-Post: 8 %
FEF2575-%Pred-Post: 240 %
FEF2575-%Pred-Pre: 220 %
FEV1-%Change-Post: -1 %
FEV1-%Pred-Post: 94 %
FEV1-%Pred-Pre: 96 %
FEV1-Post: 2.94 L
FEV1-Pre: 2.99 L
FEV1FVC-%Change-Post: 1 %
FEV1FVC-%Pred-Pre: 125 %
FEV6-%Change-Post: -3 %
FEV6-%Pred-Post: 78 %
FEV6-%Pred-Pre: 81 %
FEV6-Post: 3.14 L
FEV6-Pre: 3.25 L
FEV6FVC-%Pred-Post: 106 %
FEV6FVC-%Pred-Pre: 106 %
FVC-%Change-Post: -3 %
FVC-%Pred-Post: 74 %
FVC-%Pred-Pre: 76 %
FVC-Post: 3.14 L
FVC-Pre: 3.25 L
Post FEV1/FVC ratio: 94 %
Post FEV6/FVC ratio: 100 %
Pre FEV1/FVC ratio: 92 %
Pre FEV6/FVC Ratio: 100 %
RV % pred: 94 %
RV: 2.29 L
TLC % pred: 80 %
TLC: 5.5 L

## 2022-07-03 ENCOUNTER — Encounter: Payer: Self-pay | Admitting: Gastroenterology

## 2022-07-15 ENCOUNTER — Ambulatory Visit (INDEPENDENT_AMBULATORY_CARE_PROVIDER_SITE_OTHER): Payer: 59

## 2022-07-15 DIAGNOSIS — I429 Cardiomyopathy, unspecified: Secondary | ICD-10-CM | POA: Diagnosis not present

## 2022-07-15 LAB — CUP PACEART REMOTE DEVICE CHECK
Battery Remaining Longevity: 58 mo
Battery Remaining Percentage: 79 %
Battery Voltage: 2.98 V
Brady Statistic AP VP Percent: 13 %
Brady Statistic AP VS Percent: 1 %
Brady Statistic AS VP Percent: 87 %
Brady Statistic AS VS Percent: 1 %
Brady Statistic RA Percent Paced: 13 %
Date Time Interrogation Session: 20240326043214
Implantable Lead Connection Status: 753985
Implantable Lead Connection Status: 753985
Implantable Lead Connection Status: 753985
Implantable Lead Implant Date: 20130115
Implantable Lead Implant Date: 20130115
Implantable Lead Implant Date: 20220926
Implantable Lead Location: 753858
Implantable Lead Location: 753859
Implantable Lead Location: 753860
Implantable Pulse Generator Implant Date: 20220926
Lead Channel Impedance Value: 360 Ohm
Lead Channel Impedance Value: 400 Ohm
Lead Channel Impedance Value: 600 Ohm
Lead Channel Pacing Threshold Amplitude: 0.75 V
Lead Channel Pacing Threshold Amplitude: 1.75 V
Lead Channel Pacing Threshold Amplitude: 2.75 V
Lead Channel Pacing Threshold Pulse Width: 0.3 ms
Lead Channel Pacing Threshold Pulse Width: 0.4 ms
Lead Channel Pacing Threshold Pulse Width: 1 ms
Lead Channel Sensing Intrinsic Amplitude: 12 mV
Lead Channel Sensing Intrinsic Amplitude: 3.4 mV
Lead Channel Setting Pacing Amplitude: 1 V
Lead Channel Setting Pacing Amplitude: 1.75 V
Lead Channel Setting Pacing Amplitude: 2.75 V
Lead Channel Setting Pacing Pulse Width: 0.4 ms
Lead Channel Setting Pacing Pulse Width: 1.5 ms
Lead Channel Setting Sensing Sensitivity: 4 mV
Pulse Gen Model: 3562
Pulse Gen Serial Number: 6509372

## 2022-07-18 ENCOUNTER — Telehealth: Payer: Self-pay | Admitting: Pharmacist

## 2022-07-18 NOTE — Telephone Encounter (Signed)
Submitted a Prior Authorization renewal request to [facility] for PIRFENIDONE via CoverMyMeds. Pending clinical questions to populate  Key: [id number]  Knox Saliva, PharmD, MPH, BCPS, CPP Clinical Pharmacist (Rheumatology and Pulmonology)

## 2022-07-23 NOTE — Telephone Encounter (Signed)
Clinical questions expired. Renewed pirfenidone PA on CMM. Pending clinical questions to populate.  Key: Janett Billow, PharmD, MPH, BCPS, CPP Clinical Pharmacist (Rheumatology and Pulmonology)

## 2022-07-23 NOTE — Telephone Encounter (Signed)
Clinical questions completed for pirfenidone PA on CMM

## 2022-07-24 ENCOUNTER — Ambulatory Visit: Payer: 59 | Admitting: Pulmonary Disease

## 2022-07-24 ENCOUNTER — Encounter: Payer: Self-pay | Admitting: Pulmonary Disease

## 2022-07-24 VITALS — BP 114/72 | HR 69 | Ht 69.0 in | Wt 141.2 lb

## 2022-07-24 DIAGNOSIS — R053 Chronic cough: Secondary | ICD-10-CM

## 2022-07-24 DIAGNOSIS — J84112 Idiopathic pulmonary fibrosis: Secondary | ICD-10-CM | POA: Diagnosis not present

## 2022-07-24 MED ORDER — GABAPENTIN 100 MG PO CAPS: 200.0000 mg | ORAL_CAPSULE | Freq: Two times a day (BID) | ORAL | 1 refills | Status: AC

## 2022-07-24 NOTE — Telephone Encounter (Signed)
Received notification from [facility] regarding a prior authorization for PIRFENIDONE. Authorization has been APPROVED from 07/24/22 to 07/24/23. Approval letter sent to scan center.  Authorization # [id number]  Knox Saliva, PharmD, MPH, BCPS, CPP Clinical Pharmacist (Rheumatology and Pulmonology)

## 2022-07-24 NOTE — Patient Instructions (Addendum)
I am concerned about your on going weight loss.  Continue to take protein drinks and increase to twice a day if possible  We will try gabapentin for your cough - start out taking 200mg  twice daily - side effects include sedating effect  If after 2 weeks you do not notice any side effects and no improvement in the cough, increase to 300mg  twice daily  Recommend requesting records and obtaining CD of your CT chest scans to take to your new doctor.  Good luck with your move! Call as needed

## 2022-07-24 NOTE — Progress Notes (Signed)
Synopsis: Referred in 04/2020 for chronic cough by Dr. Abbe AmsterdamJessica Copland, MD  Subjective:   PATIENT ID: William CurtGale Arthur Curry GENDER: male DOB: March 30, 1951, MRN: [id number]  HPI  Chief Complaint  Patient presents with   Follow-up    F/U to go over PFT results. States his breathing has been stable since last visit.    William DoneGale Curry is a 72 year old male, never smoker with with history of GERD who returns to pulmonary clinic for follow up of pulmonary fibrosis.   Doing more frequent nebs with flutter valve helped with mucous clearance but did not help reduce the coughing episodes or frequency.   He has lost 5lbs since last visit, total of 9lbs since 12/17/21.   PFTs are within normal limits from 06/18/22  OV 06/03/22 He is using listerine spray for cough suppression through out the day. He reports his cough symptoms have worsened. He has dry coughing episodes and also productive cough. He is using duonebs twice daily follow by flutter valve. He reports the mornings go well after he does his pulmonary hygiene routine. He will have increase cough symptoms after eating milk, yogurt or ice cream.   Hycodan cough syrup can decrease his cough symptoms by about 60%.  He has lost 5lbs since last visit.   OV 12/17/21 He has been working with our clinical trial team and was recently nvolved in ORV-PF-01 / IPF COMFORT trial investigating cough in IPF patients. He did not notice any improvement in his cough symptoms.   He feels the cough is more productive at this time. He is spitting up more phlegm which is clear to milky color. The cough will wake him up once in a while at night. He is using ipratropium nasal spray. He is using listerine mouth spray which helps with the cough. He did not find relief from sucking on sugar free candies. He is experiencing popping/cracking of his jaw with wide opening of his mouth.   He has lost 10lbs since March.   OV 06/20/21 He continues to do well on esbriet. He reports a  7lbs weight loss since January due to lack of appetite. He reports his cough is worse since restarting the entresto.  He tried flonase for sinus drainage with no improvement.  They are not planning to move to [place] for another year now.  PFTs today remain stable. Moving in May.   OV 03/21/21 He started with Esbriet therapy on 02/07/2021 and has not tolerated increasing to 3 tabs 3 times per day.  He does report dizziness 30 minutes after taking Esbriet.  He has not checked his blood pressure during these times.  He has been holding losartan or Entresto.  He also reports developing some itchy areas of skin since starting esbriet. He is using CeraVe cream which helps.   He reports his cough is about 25% improved since last visit as we discussed holding Entresto for possible contribution to his cough symptoms.  He has been holding his fluticasone nasal spray due to issues with epistaxis.  He and his wife are planning to move back to [place], [place] in May 2023.  OV 01/29/21 He reports his cough has progressed since last visit. Remains dry with occasional sputum production. He was started on entresto in August. He was trying menthol cough candies without relief.  He had repeat PFTs today which show a decline in DLCO to 18.49 from 22 and 26 previously.   OV 10/18/20 Repeat PFTs on 09/10/20 showed stability in  his lung function with mild restrictive defect. DLCO remains normal.   He has been approved for esbriet and has meet with our pharmacy team about the benefits and risks of the medication as outlined in there meeting on 09/10/20.  He wishes to currently hold off on this therapy at this time until later this fall as he wants to avoid any of the side effects this summer.  He has had an increase in cough symptoms with sputum production since last visit.  He attributes this to his recent travels to the [facility] and changes in the climate from cold to hot weather.  He denies any  shortness of breath.  His wife has accompanied him on today's visit.  He brought a copy of his mother's death certificate which had idiopathic pulmonary fibrosis listed as the cause of death.  He reports she was a very heavy smoker.  OV 08/27/2020: His HRCT Chest on 05/14/20 showed subpleural reticulation, ground-glass, traction bronchiectasis/bronchiolectasis and probable honeycombing with upper/midlung involvement.   His PFTs 06/21/20 show moderate restrictive defect with normal DLCO.   Inflammatory markers and myositis panel are negative.  We reviewed his clinical history, HRCT chest and PFTs at multidisciplinary ILD conference in April 2022 with a recommendation to initiate anti-fibrotic therapy as his findings are concerning for pulmonary fibrosis.    He continues to experience cough with occasional clear/milky colored sputum. The nasal sprays have helped somehwat, he continues to use flonase. He has decreased his dose of pantoprazoled from 40mg  daily to 20mg  daily with no increase in his reflux symptoms.   OV 05/01/20: He reports having a cough since 2017 that has progressively worsened over the last 1 year. He does produce clear mucous at times. Has never cough up blood. He has been evaluated by GI and ENT for this cough in the past. He was started on pantoprazole over 1 year ago and he has not noticed any improvement in his cough, which he reports has worsened over the last year. He had laryngoscopy performed by ENT 10/10/19 which was unremarkable.   He denies any shortness of breath or wheezing with the cough. He denies overt heart burn or reflux symptoms. He denies seasonal allergies, sinus congestion or drainage.   He is a never smoker. He does have exposure to [employer] for [employer] as he worked for [employer]. 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    He has a nephew  with cystic fibrosis.  Past Medical History:  Diagnosis Date   DDD (degenerative disc disease), lumbar    GERD (gastroesophageal reflux disease)    Heart block AV complete 2013   Pacemaker   History of chicken pox    Hypertension 2002-2005   Kidney stone 1994-2010   Passed stone in 1994, Lithotripsy in 2010, stones passed in 2018   Macular degeneration    Scoliosis    Skin cancer    Squamous cell carcomina. Right Knee   Tinea versicolor      Family History  Problem Relation Age of Onset   Cancer Mother 34       Breast   Diabetes Mother 22       on insulin   Breast cancer Mother    Diabetes Father 12       on insulin   Bladder Cancer Father    Colon polyps Sister  Colon cancer Maternal Grandfather    Esophageal cancer Neg Hx    Rectal cancer Neg Hx    Stomach cancer Neg Hx    Prostate cancer Neg Hx      Social History   Socioeconomic History   Marital status: Married    Spouse name: Not on file   Number of children: Not on file   Years of education: Not on file   Highest education level: Not on file  Occupational History   Occupation: Manufacturing systems engineer / Accountant  Tobacco Use   Smoking status: Never   Smokeless tobacco: Former    Types: Chew   Tobacco comments:    admits do doing chewing tabacco. 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 180 mL 11   bisoprolol (ZEBETA) 5 MG tablet TAKE 1/2 TABLET BY MOUTH DAILY 45 tablet 3   COVID-19 mRNA vaccine 2023-2024 (COMIRNATY) syringe Inject into the muscle. 0.3 mL 0   ipratropium (ATROVENT) 0.03 % nasal spray PLACE 2 SPRAYS INTO BOTH NOSTRILS EVERY 12 (TWELVE) HOURS. 30 mL 4   losartan (COZAAR) 25 MG tablet Take 1 tablet (25 mg total) by mouth daily. 90 tablet 3   Multiple Vitamin (MULTIVITAMIN) tablet Take 1 tablet by mouth daily. [facility]/ one a day men complete 50 +     Multiple Vitamins-Minerals (PRESERVISION AREDS 2+MULTI VIT PO) Take 1 tablet 2 (two) times daily by mouth.     omeprazole (PRILOSEC OTC) 20 MG tablet Take 20 mg by mouth daily.     OVER THE COUNTER MEDICATION Take 65 mg by mouth daily. CBD 1 dropper full     Pirfenidone (ESBRIET) 801 MG TABS Take 1 tablet (801 mg) by mouth 3 (three) times daily with meals. 270 tablet 1   sildenafil (REVATIO) 20 MG tablet TAKE 3 TO 5 TABLETS BY MOUTH ONCE DAILY AS NEEDED 50 tablet 0   spironolactone (ALDACTONE) 25 MG tablet TAKE 1/2 TABLET BY MOUTH EVERY DAY 45 tablet 2   terbinafine (LAMISIL) 1 % cream Apply 1 application topically daily as needed (Jock itch).     triamcinolone cream (KENALOG) 0.1 % Apply topically.     valACYclovir (VALTREX) 500 MG tablet Take 500 mg by mouth as needed (Cold sores).     No facility-administered medications prior to visit.   Review of Systems  Constitutional:  Negative for chills, fever, malaise/fatigue and weight  loss.  HENT:  Negative for congestion, sinus pain and sore throat.   Eyes: Negative.   Respiratory:  Positive for cough and sputum production. Negative for hemoptysis, shortness of breath and wheezing.    Cardiovascular:  Negative for chest pain, palpitations, orthopnea, claudication and leg swelling.  Gastrointestinal:  Negative for abdominal pain, heartburn, nausea and vomiting.  Genitourinary: Negative.   Musculoskeletal:  Negative for joint pain and myalgias.  Skin:  Negative for rash.  Neurological:  Negative for weakness.  Endo/Heme/Allergies: Negative.   Psychiatric/Behavioral: Negative.  The patient is not nervous/anxious.    Objective:   Vitals:   07/24/22 0826  BP: 114/72  Pulse: 69  SpO2: 99%  Weight: 141 lb 3.2 oz (64 kg)  Height: 5\' 9"  (1.753 m)   Physical Exam Constitutional:      General: He is not in acute distress. HENT:     Head: Normocephalic and atraumatic.  Eyes:     Conjunctiva/sclera: Conjunctivae normal.  Cardiovascular:     Rate and Rhythm: Normal rate and regular rhythm.     Pulses: Normal pulses.     Heart sounds: Normal heart sounds. No murmur heard. Pulmonary:     Effort: Pulmonary effort is normal.     Breath sounds: Rales (bibasilar) present. No wheezing or rhonchi.  Musculoskeletal:     Right lower leg: No edema.     Left lower leg: No edema.  Skin:    General: Skin is warm and dry.  Neurological:     General: No focal deficit present.     Mental Status: He is alert.    CBC    Component Value Date/Time   WBC 5.9 02/26/2022 0905   RBC 4.45 02/26/2022 0905   HGB 14.5 02/26/2022 0905   HGB 15.3 01/10/2021 1035   HCT 43.0 02/26/2022 0905   HCT 44.5 01/10/2021 1035   PLT 195.0 02/26/2022 0905   PLT 193 01/10/2021 1035   MCV 96.8 02/26/2022 0905   MCV 94 01/10/2021 1035   MCH 32.1 01/10/2021 1035   MCHC 33.8 02/26/2022 0905   RDW 12.7 02/26/2022 0905   RDW 13.1 01/10/2021 1035      Latest Ref Rng & Units 02/26/2022    9:05 AM 01/16/2022    3:01 PM 01/24/2021   11:43 AM  BMP  Glucose 70 - 99 mg/dL 88  92  91   BUN 6 - 23 mg/dL 18  22  16    Creatinine 0.40 - 1.50 mg/dL 1.610.82  0.960.91  0.450.93   BUN/Creat Ratio 10 - 24  24  17     Sodium 135 - 145 mEq/L 139  139  139   Potassium 3.5 - 5.1 mEq/L 4.5  4.3  4.7   Chloride 96 - 112 mEq/L 102  98  100   CO2 19 - 32 mEq/L 34  26  26   Calcium 8.4 - 10.5 mg/dL 9.1  9.5  9.3    Chest imaging: HRCT Chest 04/08/22 1. Subpleural predominant reticular opacities with traction bronchiectasis and no clear cranial caudal predominance. No evidence of progression when compared with March 27, 2021 prior, although findings have likely progressed when compared with May 14, 2020 prior. Findings are categorized as probable UIP per consensus guidelines: Diagnosis of Idiopathic Pulmonary Fibrosis: An Official ATS/ERS/JRS/ALAT Clinical Practice Guideline. Am Rosezetta SchlatterJ Respir Crit Care Med Vol 198, Iss 5, [phone], Dec 20 2016. 2. Aortic Atherosclerosis  HRCT Chest 03/27/21 1. No significant change in moderate pulmonary fibrosis in a  pattern without clear apical to basal gradient featuring irregular peripheral interstitial opacity, septal thickening, and areas of subpleural bronchiolectasis throughout. Mild varicoid and traction bronchiectasis. Findings are categorized as probable UIP per consensus guidelines: Diagnosis of Idiopathic Pulmonary Fibrosis: An Official ATS/ERS/JRS/ALAT Clinical Practice Guideline. Am Rosezetta Schlatter Crit Care Med Vol 198, Iss 5, [phone], Dec 20 2016. 2. Unchanged prominent mediastinal and bilateral hilar lymph nodes, some of which are calcified, nonspecific although likely reactive to fibrosis. Calcification may be sequelae of prior granulomatous infection. 3. Cardiomegaly. 4. Enlargement of the main pulmonary artery, as can be seen in pulmonary hypertension.  HRCT Chest 05/14/20 1. Pulmonary parenchymal pattern of fibrosis may be due to nonspecific interstitial pneumonitis. Usual interstitial pneumonitis is not excluded. Findings are indeterminate for UIP per consensus guidelines: Diagnosis of Idiopathic Pulmonary Fibrosis: An Official ATS/ERS/JRS/ALAT  Clinical Practice Guideline. Am Rosezetta Schlatter Crit Care Med Vol 198, Iss 5, [phone], Dec 20 2016. 2. Small to borderline enlarged mediastinal lymph nodes can be seen in the setting of interstitial lung disease. 3. Right renal stone.  Chest Radiograph 05/04/2018 Cardiac shadow is enlarged. Pacing device is noted. The lungs are well aerated bilaterally with some minimal interstitial changes. No focal confluent infiltrate or effusion is seen. No bony abnormality is noted.  PFT:    Latest Ref Rng & Units 06/18/2022    8:46 AM 06/20/2021    8:45 AM 01/29/2021   12:49 PM 09/10/2020   11:50 AM 06/21/2020   11:55 AM  PFT Results  FVC-Pre L 3.25  3.09  2.94  3.01  2.96   FVC-Predicted Pre % 76  72  69  70  68   FVC-Post L 3.14  3.14  2.99  2.99  2.87   FVC-Predicted Post % 74  73  70  69  66   Pre FEV1/FVC % % 92  93  94  94  93   Post FEV1/FCV % % 94  94  96  94  95   FEV1-Pre L 2.99  2.89  2.78  2.84  2.75   FEV1-Predicted Pre % 96  92  88  89  86   FEV1-Post L 2.94  2.95  2.86  2.81  2.74   DLCO uncorrected ml/min/mmHg 22.12  19.67  18.85  26.58  22.53   DLCO UNC% % 88  78  74  105  89   DLCO corrected ml/min/mmHg 22.12  19.67  18.49  26.58  22.53   DLCO COR %Predicted % 88  78  73  105  89   DLVA Predicted % 118  105  106  136  144   TLC L 5.50  4.60  4.45  4.64  4.02   TLC % Predicted % 80  67  65  68  58   RV % Predicted % 94  63  65  67  50   PFTs 10/112022: mild restrictive defect and mild diffusion defect PFTs 06/18/22 are within normal limits. FVC 3.14L (74%).     Assessment & Plan:   IPF (idiopathic pulmonary fibrosis)  Chronic cough - Plan: gabapentin (NEURONTIN) 100 MG capsule  Discussion: William Curry is a 72 year old male, never smoker with with history of GERD who returns to pulmonary clinic for follow up of pulmonary fibrosis.   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  He previously had mild restrictive defect on PFTs which has normalized on  most recent set of PFTs.   He is  to continue esbriet 801mg  3 times daily. He will need close monitoring in the future as he has lost about 10-15lbs since starting antifibrotic therapy.  I have given recommendation for follow up at [facility] pulmonary clinic in [place] as he is moving to [place] next month.  He is to continue duoneb treatments and flutter valve every 4 hours as needed for mucous clearance.   We will start gabapentin 200mg  twice daily for on going cough. He is to increase to 300mg  twice daily in 2-4 weeks if tolerating the medication and no improvement in his cough.  Follow up as needed.  Melody Comas, MD [facility] Office: [phone]   Current Outpatient Medications:    albuterol (ACCUNEB) 1.25 MG/3ML nebulizer solution, Take 3 mLs (1.25 mg total) by nebulization every 6 (six) hours as needed for wheezing., Disp: 180 mL, Rfl: 11   bisoprolol (ZEBETA) 5 MG tablet, TAKE 1/2 TABLET BY MOUTH DAILY, Disp: 45 tablet, Rfl: 3   COVID-19 mRNA vaccine 2023-2024 (COMIRNATY) syringe, Inject into the muscle., Disp: 0.3 mL, Rfl: 0   gabapentin (NEURONTIN) 100 MG capsule, Take 2 capsules (200 mg total) by mouth 2 (two) times daily., Disp: 360 capsule, Rfl: 1   ipratropium (ATROVENT) 0.03 % nasal spray, PLACE 2 SPRAYS INTO BOTH NOSTRILS EVERY 12 (TWELVE) HOURS., Disp: 30 mL, Rfl: 4   losartan (COZAAR) 25 MG tablet, Take 1 tablet (25 mg total) by mouth daily., Disp: 90 tablet, Rfl: 3   Multiple Vitamin (MULTIVITAMIN) tablet, Take 1 tablet by mouth daily. [facility]/ one a day men complete 50 +, Disp: , Rfl:    Multiple Vitamins-Minerals (PRESERVISION AREDS 2+MULTI VIT PO), Take 1 tablet 2 (two) times daily by mouth., Disp: , Rfl:    omeprazole (PRILOSEC OTC) 20 MG tablet, Take 20 mg by mouth daily., Disp: , Rfl:    OVER THE COUNTER MEDICATION, Take 65 mg by mouth daily. CBD 1 dropper full, Disp: , Rfl:    Pirfenidone (ESBRIET) 801 MG TABS, Take 1 tablet (801  mg) by mouth 3 (three) times daily with meals., Disp: 270 tablet, Rfl: 1   sildenafil (REVATIO) 20 MG tablet, TAKE 3 TO 5 TABLETS BY MOUTH ONCE DAILY AS NEEDED, Disp: 50 tablet, Rfl: 0   spironolactone (ALDACTONE) 25 MG tablet, TAKE 1/2 TABLET BY MOUTH EVERY DAY, Disp: 45 tablet, Rfl: 2   terbinafine (LAMISIL) 1 % cream, Apply 1 application topically daily as needed (Jock itch)., Disp: , Rfl:    triamcinolone cream (KENALOG) 0.1 %, Apply topically., Disp: , Rfl:    valACYclovir (VALTREX) 500 MG tablet, Take 500 mg by mouth as needed (Cold sores)., Disp: , Rfl:

## 2022-07-26 ENCOUNTER — Encounter: Payer: Self-pay | Admitting: Pulmonary Disease

## 2022-07-30 ENCOUNTER — Other Ambulatory Visit: Payer: Self-pay | Admitting: Family Medicine

## 2022-08-13 ENCOUNTER — Other Ambulatory Visit: Payer: Self-pay | Admitting: Pulmonary Disease

## 2022-08-13 DIAGNOSIS — Z5181 Encounter for therapeutic drug level monitoring: Secondary | ICD-10-CM

## 2022-08-13 DIAGNOSIS — J84112 Idiopathic pulmonary fibrosis: Secondary | ICD-10-CM

## 2022-08-13 NOTE — Telephone Encounter (Signed)
Refill sent for PIRFENIDONE to [facility] (pulmonary fibrosis team): [phone]  Dose: 801 mg three times daily  Last OV: 07/24/22 Provider: Dr. Francine Graven  Next OV: not scheduled. Patient appears to be moving to [place] next month. Future fills will have to come from [place] pulmonologist  Chesley Mires, PharmD, MPH, BCPS Clinical Pharmacist (Rheumatology and Pulmonology)

## 2022-08-26 NOTE — Progress Notes (Signed)
Remote pacemaker transmission.   

## 2022-09-19 ENCOUNTER — Other Ambulatory Visit: Payer: Self-pay | Admitting: Internal Medicine

## 2022-09-19 ENCOUNTER — Other Ambulatory Visit: Payer: Self-pay | Admitting: Pulmonary Disease

## 2022-09-19 DIAGNOSIS — R053 Chronic cough: Secondary | ICD-10-CM

## 2022-10-14 ENCOUNTER — Ambulatory Visit: Payer: 59

## 2022-10-15 LAB — CUP PACEART REMOTE DEVICE CHECK
Battery Remaining Longevity: 55 mo
Battery Remaining Percentage: 75 %
Battery Voltage: 2.98 V
Brady Statistic AP VP Percent: 14 %
Brady Statistic AP VS Percent: 1 %
Brady Statistic AS VP Percent: 86 %
Brady Statistic AS VS Percent: 1 %
Brady Statistic RA Percent Paced: 14 %
Date Time Interrogation Session: 20240625031617
Implantable Lead Connection Status: 753985
Implantable Lead Connection Status: 753985
Implantable Lead Connection Status: 753985
Implantable Lead Implant Date: 20130115
Implantable Lead Implant Date: 20130115
Implantable Lead Implant Date: 20220926
Implantable Lead Location: 753858
Implantable Lead Location: 753859
Implantable Lead Location: 753860
Implantable Pulse Generator Implant Date: 20220926
Lead Channel Impedance Value: 360 Ohm
Lead Channel Impedance Value: 390 Ohm
Lead Channel Impedance Value: 550 Ohm
Lead Channel Pacing Threshold Amplitude: 0.625 V
Lead Channel Pacing Threshold Amplitude: 1.625 V
Lead Channel Pacing Threshold Amplitude: 2.75 V
Lead Channel Pacing Threshold Pulse Width: 0.3 ms
Lead Channel Pacing Threshold Pulse Width: 0.4 ms
Lead Channel Pacing Threshold Pulse Width: 1 ms
Lead Channel Sensing Intrinsic Amplitude: 12 mV
Lead Channel Sensing Intrinsic Amplitude: 3.5 mV
Lead Channel Setting Pacing Amplitude: 1 V
Lead Channel Setting Pacing Amplitude: 1.625
Lead Channel Setting Pacing Amplitude: 2.625
Lead Channel Setting Pacing Pulse Width: 0.4 ms
Lead Channel Setting Pacing Pulse Width: 1.5 ms
Lead Channel Setting Sensing Sensitivity: 4 mV
Pulse Gen Model: 3562
Pulse Gen Serial Number: 6509372

## 2022-10-16 ENCOUNTER — Telehealth: Payer: Self-pay

## 2022-10-16 NOTE — Telephone Encounter (Signed)
Pt moved and no longer insured. no longer wants to do remote monitoring.

## 2022-11-20 IMAGING — CT CT CHEST HIGH RESOLUTION
2 of 5 series · 15 of 36 positions shown, 18 images · non-contrast
Comparison: 10/12/2020

CLINICAL DATA: Interstitial lung disease

EXAM:
CT CHEST WITHOUT CONTRAST
TECHNIQUE: Multidetector CT imaging of the chest was performed following the
standard protocol without intravenous contrast. High resolution
imaging of the lungs, as well as inspiratory and expiratory imaging,
was performed.

[Series 2: thorax · axial · 0.77mm/px · z∈[-292,-0]mm · 12 of 160 slices shown, 15 images]
[im 7/160  mediastinal]
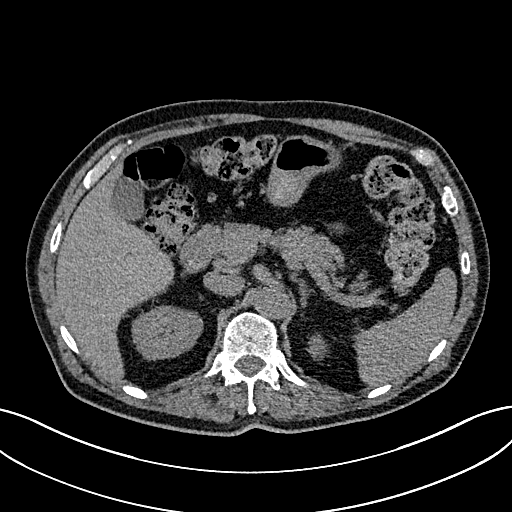
[im 7/160  lung]
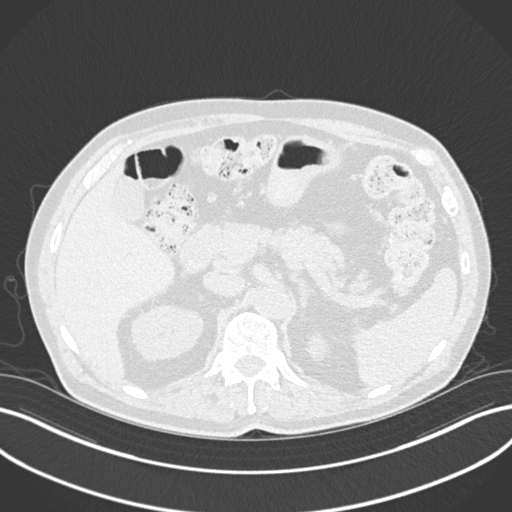
[im 21/160  lung]
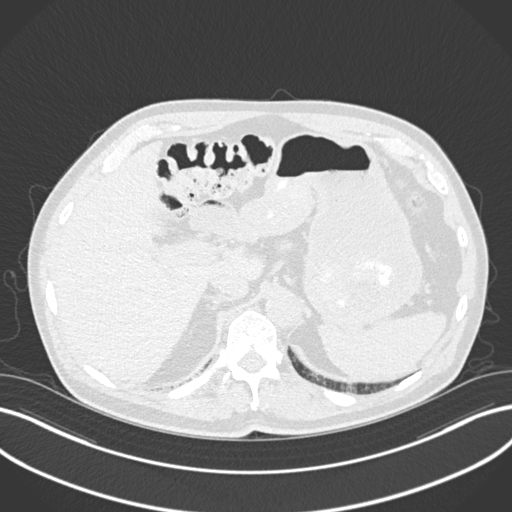
[im 35/160  lung]
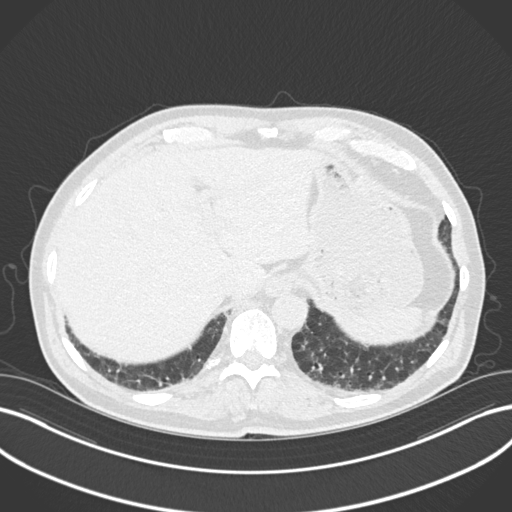
[im 49/160  lung]
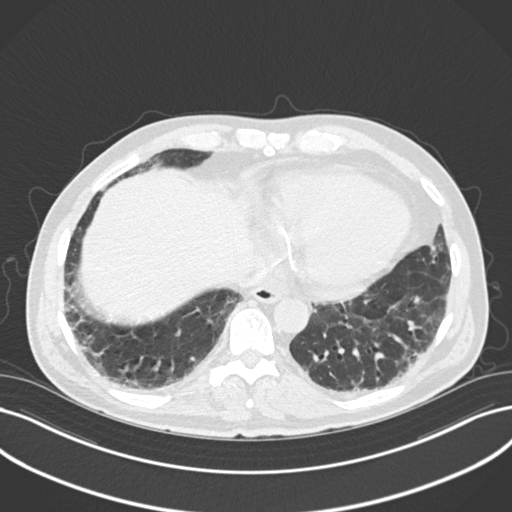
[im 63/160  mediastinal]
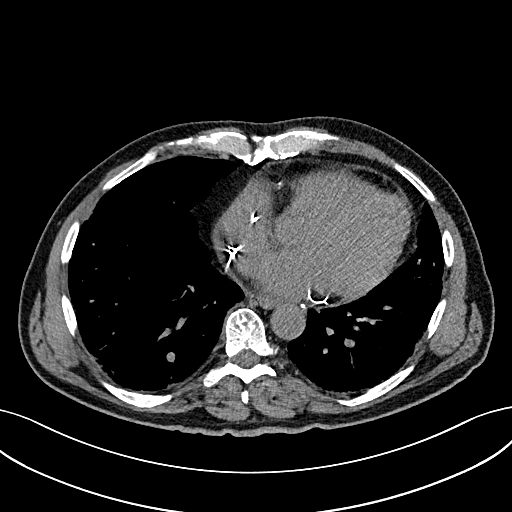
[im 63/160  lung]
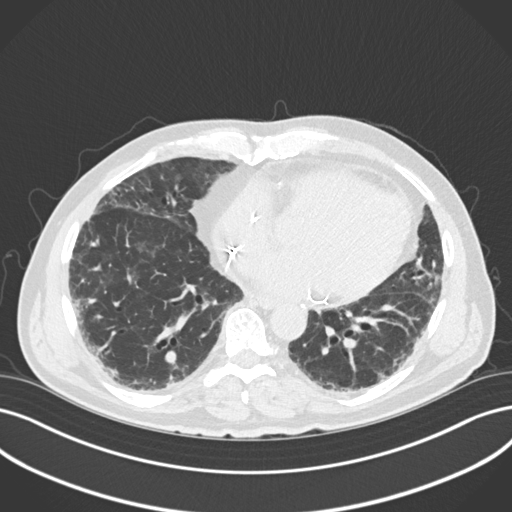
[im 77/160  lung]
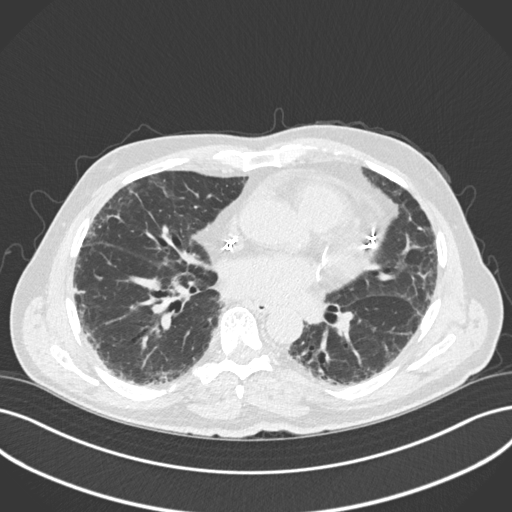
[im 83/160  lung]
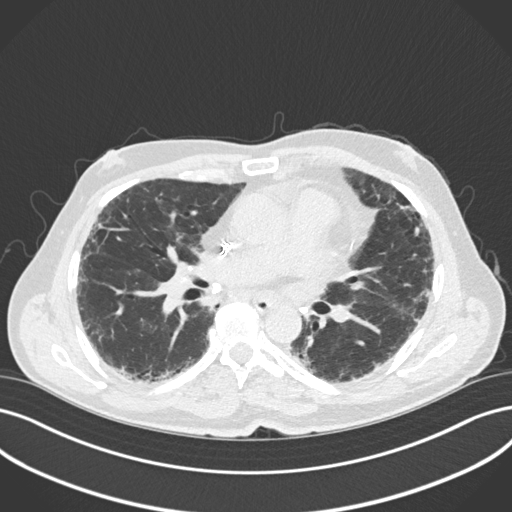
[im 97/160  lung]
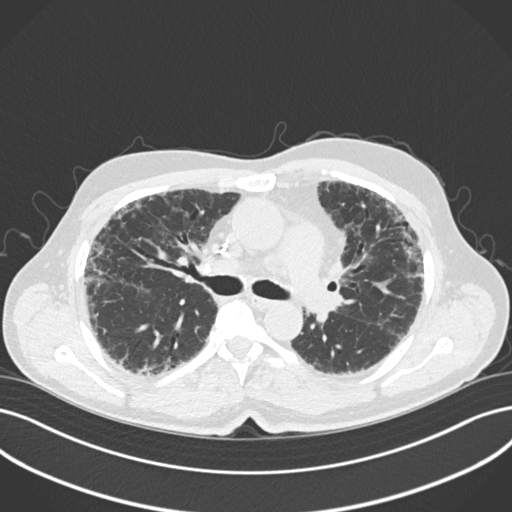
[im 111/160  mediastinal]
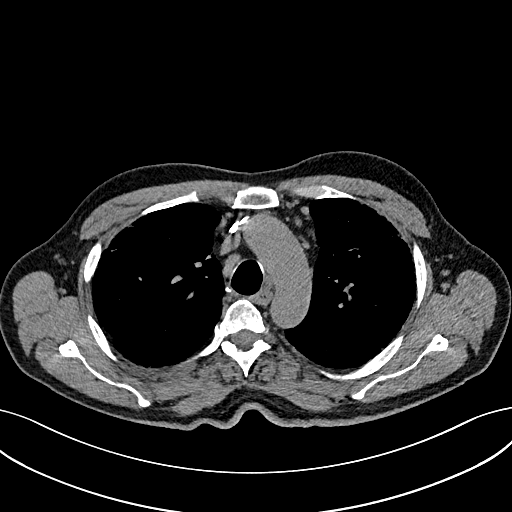
[im 111/160  lung]
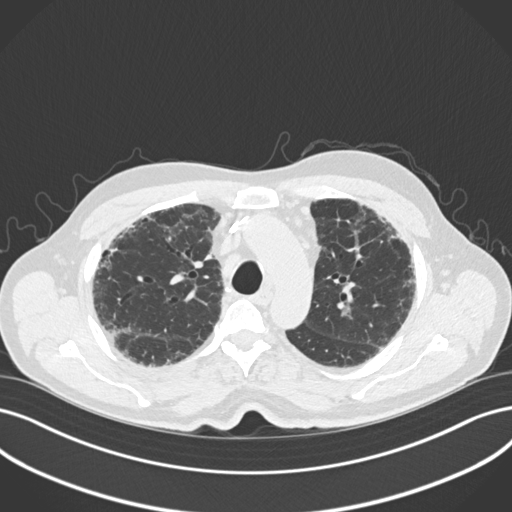
[im 125/160  lung]
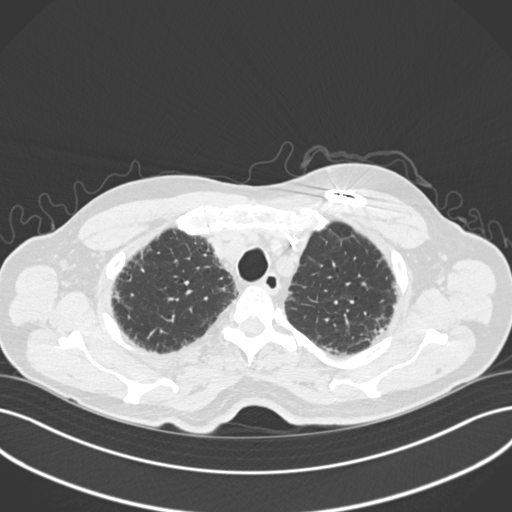
[im 139/160  lung]
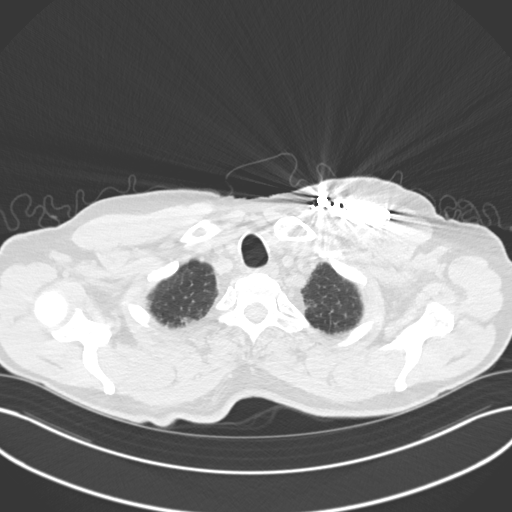
[im 153/160  lung]
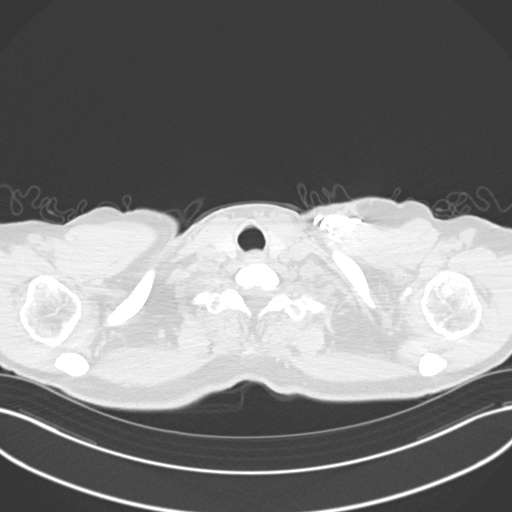

[Series 8: coronal · coronal · 0.68mm/px · 3 of 119 slices shown]
[im 24/119  lung]
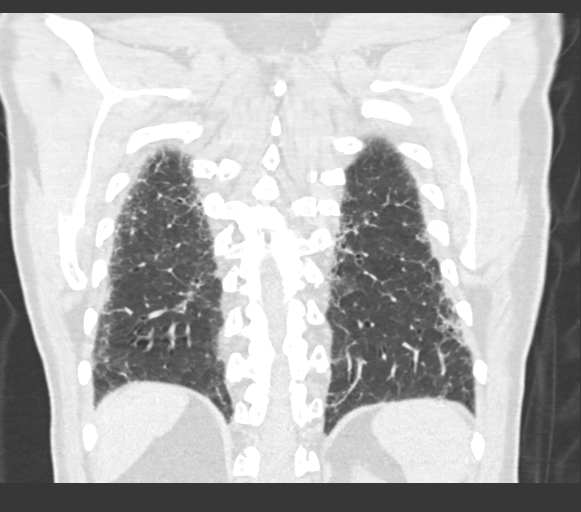
[im 48/119  lung]
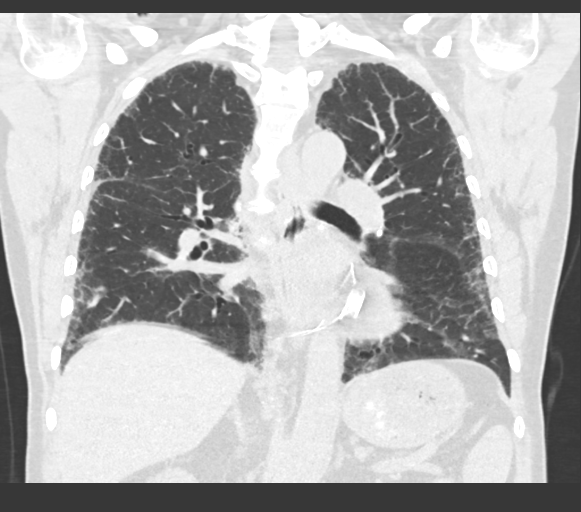
[im 71/119  lung]
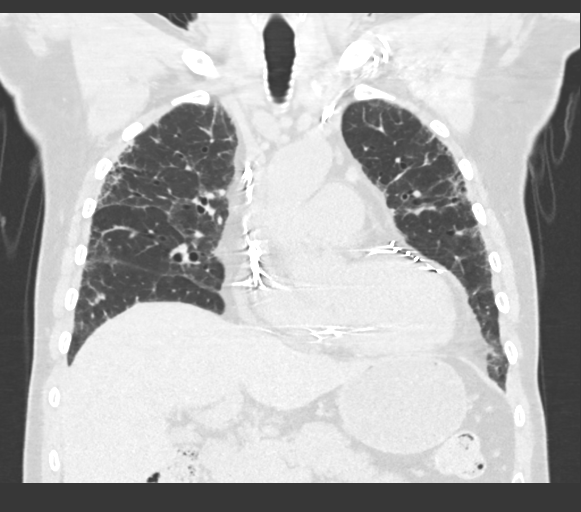

[15 of 36 positions shown; findings below may reference images not displayed]

FINDINGS: Cardiovascular: Left chest multi lead pacer. Cardiomegaly.
Enlargement of the main pulmonary artery measuring up to 3.8 cm in
caliber. No pericardial effusion.

Mediastinum/Nodes: Unchanged prominent mediastinal and bilateral
hilar lymph nodes, some of which are calcified. Thyroid gland,
trachea, and esophagus demonstrate no significant findings.

Lungs/Pleura: Moderate pulmonary fibrosis in a pattern without clear
apical to basal gradient featuring irregular peripheral interstitial
opacity, septal thickening, and areas of subpleural bronchiolectasis
throughout. Mild varicoid and traction bronchiectasis. No
significant air trapping on expiratory phase imaging. No pleural
effusion or pneumothorax.

Upper Abdomen: No acute abnormality.

Musculoskeletal: No chest wall mass or suspicious bone lesions
identified.
IMPRESSION: 1. No significant change in moderate pulmonary fibrosis in a pattern
without clear apical to basal gradient featuring irregular
peripheral interstitial opacity, septal thickening, and areas of
subpleural bronchiolectasis throughout. Mild varicoid and traction
bronchiectasis. Findings are categorized as probable UIP per
consensus guidelines: Diagnosis of Idiopathic Pulmonary Fibrosis: An
Official ATS/ERS/JRS/ALAT Clinical Practice Guideline. Am J Respir
Crit Care Med Vol 198, [place]chnydr[place] 5, [id number][DATE].
2. Unchanged prominent mediastinal and bilateral hilar lymph nodes,
some of which are calcified, nonspecific although likely reactive to
fibrosis. Calcification may be sequelae of prior granulomatous
infection.
3. Cardiomegaly.
4. Enlargement of the main pulmonary artery, as can be seen in
pulmonary hypertension.

## 2023-03-12 ENCOUNTER — Other Ambulatory Visit: Payer: Self-pay | Admitting: Internal Medicine

## 2023-06-07 ENCOUNTER — Other Ambulatory Visit: Payer: Self-pay | Admitting: Internal Medicine
